# Patient Record
Sex: Female | Born: 1940 | Race: White | Hispanic: No | Smoking: Never smoker
Health system: Southern US, Community
[De-identification: ages and names within clinical notes are randomized; demographics above are authoritative.]

## PROBLEM LIST (undated history)

## (undated) DIAGNOSIS — I2510 Atherosclerotic heart disease of native coronary artery without angina pectoris: Secondary | ICD-10-CM

## (undated) DIAGNOSIS — E7800 Pure hypercholesterolemia, unspecified: Secondary | ICD-10-CM

## (undated) DIAGNOSIS — R519 Headache, unspecified: Secondary | ICD-10-CM

## (undated) DIAGNOSIS — N183 Chronic kidney disease, stage 3 unspecified: Secondary | ICD-10-CM

## (undated) DIAGNOSIS — R51 Headache: Secondary | ICD-10-CM

## (undated) DIAGNOSIS — M8580 Other specified disorders of bone density and structure, unspecified site: Secondary | ICD-10-CM

## (undated) DIAGNOSIS — J90 Pleural effusion, not elsewhere classified: Secondary | ICD-10-CM

## (undated) DIAGNOSIS — Z860100 Personal history of colon polyps, unspecified: Secondary | ICD-10-CM

## (undated) DIAGNOSIS — E785 Hyperlipidemia, unspecified: Secondary | ICD-10-CM

## (undated) DIAGNOSIS — M1990 Unspecified osteoarthritis, unspecified site: Secondary | ICD-10-CM

## (undated) DIAGNOSIS — J45909 Unspecified asthma, uncomplicated: Secondary | ICD-10-CM

## (undated) HISTORY — DX: Personal history of colonic polyps: Z86.010

## (undated) HISTORY — PX: CARDIAC CATHETERIZATION: SHX172

## (undated) HISTORY — DX: Personal history of colon polyps, unspecified: Z86.0100

## (undated) HISTORY — DX: Hyperlipidemia, unspecified: E78.5

## (undated) HISTORY — DX: Other specified disorders of bone density and structure, unspecified site: M85.80

## (undated) HISTORY — DX: Unspecified asthma, uncomplicated: J45.909

## (undated) HISTORY — DX: Pure hypercholesterolemia, unspecified: E78.00

## (undated) HISTORY — DX: Chronic kidney disease, stage 3 unspecified: N18.30

---

## 1998-02-22 ENCOUNTER — Ambulatory Visit (HOSPITAL_COMMUNITY): Admission: RE | Admit: 1998-02-22 | Discharge: 1998-02-22 | Payer: Self-pay | Admitting: Gastroenterology

## 1998-08-02 ENCOUNTER — Other Ambulatory Visit: Admission: RE | Admit: 1998-08-02 | Discharge: 1998-08-02 | Payer: Self-pay | Admitting: *Deleted

## 1999-10-11 ENCOUNTER — Encounter: Admission: RE | Admit: 1999-10-11 | Discharge: 1999-10-11 | Payer: Self-pay | Admitting: *Deleted

## 1999-10-11 ENCOUNTER — Encounter: Payer: Self-pay | Admitting: *Deleted

## 1999-10-30 ENCOUNTER — Other Ambulatory Visit: Admission: RE | Admit: 1999-10-30 | Discharge: 1999-10-30 | Payer: Self-pay | Admitting: *Deleted

## 2000-10-11 ENCOUNTER — Encounter: Admission: RE | Admit: 2000-10-11 | Discharge: 2000-10-11 | Payer: Self-pay | Admitting: *Deleted

## 2000-10-11 ENCOUNTER — Encounter: Payer: Self-pay | Admitting: *Deleted

## 2000-10-29 ENCOUNTER — Other Ambulatory Visit: Admission: RE | Admit: 2000-10-29 | Discharge: 2000-10-29 | Payer: Self-pay | Admitting: *Deleted

## 2002-01-28 ENCOUNTER — Encounter: Payer: Self-pay | Admitting: *Deleted

## 2002-01-28 ENCOUNTER — Encounter: Admission: RE | Admit: 2002-01-28 | Discharge: 2002-01-28 | Payer: Self-pay | Admitting: *Deleted

## 2002-02-04 ENCOUNTER — Other Ambulatory Visit: Admission: RE | Admit: 2002-02-04 | Discharge: 2002-02-04 | Payer: Self-pay | Admitting: *Deleted

## 2003-01-29 ENCOUNTER — Other Ambulatory Visit: Admission: RE | Admit: 2003-01-29 | Discharge: 2003-01-29 | Payer: Self-pay | Admitting: *Deleted

## 2003-02-02 ENCOUNTER — Encounter: Admission: RE | Admit: 2003-02-02 | Discharge: 2003-02-02 | Payer: Self-pay | Admitting: *Deleted

## 2003-02-02 ENCOUNTER — Encounter: Payer: Self-pay | Admitting: *Deleted

## 2003-03-02 ENCOUNTER — Ambulatory Visit (HOSPITAL_COMMUNITY): Admission: RE | Admit: 2003-03-02 | Discharge: 2003-03-02 | Payer: Self-pay | Admitting: Gastroenterology

## 2003-03-02 ENCOUNTER — Encounter (INDEPENDENT_AMBULATORY_CARE_PROVIDER_SITE_OTHER): Payer: Self-pay | Admitting: *Deleted

## 2004-03-03 ENCOUNTER — Encounter: Admission: RE | Admit: 2004-03-03 | Discharge: 2004-03-03 | Payer: Self-pay | Admitting: Internal Medicine

## 2005-05-30 ENCOUNTER — Encounter: Admission: RE | Admit: 2005-05-30 | Discharge: 2005-05-30 | Payer: Self-pay | Admitting: Internal Medicine

## 2006-06-11 ENCOUNTER — Ambulatory Visit (HOSPITAL_COMMUNITY): Admission: RE | Admit: 2006-06-11 | Discharge: 2006-06-11 | Payer: Self-pay | Admitting: Internal Medicine

## 2006-07-29 ENCOUNTER — Other Ambulatory Visit: Admission: RE | Admit: 2006-07-29 | Discharge: 2006-07-29 | Payer: Self-pay | Admitting: Family Medicine

## 2007-07-21 ENCOUNTER — Ambulatory Visit (HOSPITAL_COMMUNITY): Admission: RE | Admit: 2007-07-21 | Discharge: 2007-07-21 | Payer: Self-pay | Admitting: Family Medicine

## 2008-10-05 ENCOUNTER — Ambulatory Visit (HOSPITAL_COMMUNITY): Admission: RE | Admit: 2008-10-05 | Discharge: 2008-10-05 | Payer: Self-pay | Admitting: Family Medicine

## 2009-08-01 ENCOUNTER — Other Ambulatory Visit: Admission: RE | Admit: 2009-08-01 | Discharge: 2009-08-01 | Payer: Self-pay | Admitting: Family Medicine

## 2009-11-02 ENCOUNTER — Ambulatory Visit (HOSPITAL_COMMUNITY): Admission: RE | Admit: 2009-11-02 | Discharge: 2009-11-02 | Payer: Self-pay | Admitting: Family Medicine

## 2010-10-05 ENCOUNTER — Other Ambulatory Visit (HOSPITAL_COMMUNITY): Payer: Self-pay | Admitting: Family Medicine

## 2010-10-05 DIAGNOSIS — Z1231 Encounter for screening mammogram for malignant neoplasm of breast: Secondary | ICD-10-CM

## 2010-10-05 DIAGNOSIS — Z0000 Encounter for general adult medical examination without abnormal findings: Secondary | ICD-10-CM

## 2010-11-09 ENCOUNTER — Ambulatory Visit (HOSPITAL_COMMUNITY)
Admission: RE | Admit: 2010-11-09 | Discharge: 2010-11-09 | Disposition: A | Discharge status: Home / Self Care | Payer: PRIVATE HEALTH INSURANCE | Source: Ambulatory Visit | Attending: Family Medicine | Admitting: Family Medicine

## 2010-11-09 DIAGNOSIS — Z1231 Encounter for screening mammogram for malignant neoplasm of breast: Secondary | ICD-10-CM | POA: Insufficient documentation

## 2011-02-02 NOTE — Op Note (Signed)
NAME:  Kimberly Calhoun, Kimberly Calhoun                          ACCOUNT NO.:  [id number]   MEDICAL RECORD NO.:  [id number]                   PATIENT TYPE:  AMB   LOCATION:  ENDO                                 FACILITY:  [facility]   PHYSICIAN:  Bernette Redbird, M.D.                DATE OF BIRTH:  [birth year]   DATE OF PROCEDURE:  03/03/2003  DATE OF DISCHARGE:                                 OPERATIVE REPORT   PROCEDURE PERFORMED:  Colonoscopy with biopsies.   INDICATIONS FOR PROCEDURE:  A 70 year old female with family history of  colon cancer in her mother, who was diagnosed around age 50.  The patient  had a negative colonoscopy (hyperplastic polyps only) five years ago.   FINDINGS:  Two diminutive polyps removed.   CONSENT:  The nature, purpose and risks of the procedure had been discussed  with the patient previously and she provided written consent.   SEDATION:  Fentanyl 100 mcg and Versed 10 mg IV which is somewhat less than  at the time of her previous procedure.   DESCRIPTION OF PROCEDURE:  The Olympus adjustable tension pediatric video  colonoscope was advanced around the colon to the cecum and pull back was  then performed.   It was rather difficult to negotiate the sigmoid region where there was a  sharp angulation with some degree of fixation but once we were around that  kink in the colon, we were able to advance quite easily.   The quality of the prep was very good except for some adherent stool in the  cecum and proximal ascending colon which could not be irrigated off.  This  was just a film and it is not felt that it would have obscured any  significant lesions.   There were two tiny hyperplastic appearing sessile polyps removed during  this exam, one in the cecum and the other at 34 cm.  Each was about 2 mm  across, hyperplastic in appearance and removed by a single cold biopsy.  No  large polyps, cancer, colitis, vascular malformations or diverticulosis were  noted.   Retroflexion of the rectum and reinspection of the rectum was unremarkable.   The patient tolerated the procedure well and there were no apparent  complications.   IMPRESSION:  Two diminutive polyps, otherwise unremarkable exam.    PLAN:  Follow up colonoscopy in five years regardless of the polyp  histology, in view of the family history of colon cancer.                                               Bernette Redbird, M.D.    RB/MEDQ  D:  03/02/2003  T:  03/02/2003  Job:  [id number]   cc:   Sharlet Salina, M.D.  [address]  [place]  [place] [address]  Fax: [phone]

## 2011-11-20 ENCOUNTER — Other Ambulatory Visit (HOSPITAL_COMMUNITY): Payer: Self-pay | Admitting: Family Medicine

## 2011-11-20 DIAGNOSIS — Z1231 Encounter for screening mammogram for malignant neoplasm of breast: Secondary | ICD-10-CM

## 2011-12-19 ENCOUNTER — Ambulatory Visit (HOSPITAL_COMMUNITY)
Admission: RE | Admit: 2011-12-19 | Discharge: 2011-12-19 | Disposition: A | Discharge status: Home / Self Care | Payer: Commercial Managed Care - PPO | Source: Ambulatory Visit | Attending: Family Medicine | Admitting: Family Medicine

## 2011-12-19 DIAGNOSIS — Z1231 Encounter for screening mammogram for malignant neoplasm of breast: Secondary | ICD-10-CM | POA: Insufficient documentation

## 2012-11-19 ENCOUNTER — Other Ambulatory Visit (HOSPITAL_COMMUNITY): Payer: Self-pay | Admitting: Family Medicine

## 2012-11-19 DIAGNOSIS — Z1231 Encounter for screening mammogram for malignant neoplasm of breast: Secondary | ICD-10-CM

## 2012-12-19 ENCOUNTER — Ambulatory Visit (HOSPITAL_COMMUNITY)
Admission: RE | Admit: 2012-12-19 | Discharge: 2012-12-19 | Disposition: A | Discharge status: Home / Self Care | Payer: Commercial Managed Care - PPO | Source: Ambulatory Visit | Attending: Family Medicine | Admitting: Family Medicine

## 2012-12-19 DIAGNOSIS — Z1231 Encounter for screening mammogram for malignant neoplasm of breast: Secondary | ICD-10-CM | POA: Insufficient documentation

## 2013-02-15 HISTORY — PX: ABDOMINAL HYSTERECTOMY: SHX81

## 2014-02-16 ENCOUNTER — Other Ambulatory Visit (HOSPITAL_COMMUNITY): Payer: Self-pay | Admitting: Family Medicine

## 2014-02-16 DIAGNOSIS — Z1231 Encounter for screening mammogram for malignant neoplasm of breast: Secondary | ICD-10-CM

## 2014-02-25 ENCOUNTER — Ambulatory Visit (HOSPITAL_COMMUNITY)
Admission: RE | Admit: 2014-02-25 | Discharge: 2014-02-25 | Disposition: A | Discharge status: Home / Self Care | Payer: 59 | Source: Ambulatory Visit | Attending: Family Medicine | Admitting: Family Medicine

## 2014-02-25 DIAGNOSIS — Z1231 Encounter for screening mammogram for malignant neoplasm of breast: Secondary | ICD-10-CM | POA: Insufficient documentation

## 2014-11-29 ENCOUNTER — Encounter (HOSPITAL_COMMUNITY): Payer: Self-pay | Admitting: *Deleted

## 2014-12-06 ENCOUNTER — Other Ambulatory Visit: Payer: Self-pay | Admitting: Gastroenterology

## 2014-12-06 NOTE — Anesthesia Preprocedure Evaluation (Signed)
Anesthesia Evaluation  Patient identified by MRN, date of birth, ID band Patient awake    Reviewed: Allergy & Precautions, NPO status , Patient's Chart, lab work & pertinent test results  History of Anesthesia Complications Negative for: history of anesthetic complications  Airway Mallampati: II  TM Distance: >3 FB Neck ROM: Full    Dental no notable dental hx. (+) Dental Advisory Given   Pulmonary neg pulmonary ROS,  breath sounds clear to auscultation  Pulmonary exam normal       Cardiovascular negative cardio ROS  Rhythm:Regular Rate:Normal     Neuro/Psych  Headaches, negative psych ROS   GI/Hepatic negative GI ROS, Neg liver ROS,   Endo/Other  negative endocrine ROS  Renal/GU negative Renal ROS  negative genitourinary   Musculoskeletal  (+) Arthritis -, Osteoarthritis,    Abdominal   Peds negative pediatric ROS (+)  Hematology negative hematology ROS (+)   Anesthesia Other Findings   Reproductive/Obstetrics negative OB ROS                             Anesthesia Physical Anesthesia Plan  ASA: II  Anesthesia Plan: MAC   Post-op Pain Management:    Induction: Intravenous  Airway Management Planned: Nasal Cannula  Additional Equipment:   Intra-op Plan:   Post-operative Plan:   Informed Consent: I have reviewed the patients History and Physical, chart, labs and discussed the procedure including the risks, benefits and alternatives for the proposed anesthesia with the patient or authorized representative who has indicated his/her understanding and acceptance.   Dental advisory given  Plan Discussed with: CRNA  Anesthesia Plan Comments:         Anesthesia Quick Evaluation

## 2014-12-07 ENCOUNTER — Encounter (HOSPITAL_COMMUNITY)
Admission: RE | Disposition: A | Discharge status: Home / Self Care | Payer: Self-pay | Source: Ambulatory Visit | Attending: Gastroenterology

## 2014-12-07 ENCOUNTER — Ambulatory Visit (HOSPITAL_COMMUNITY)
Admission: RE | Admit: 2014-12-07 | Discharge: 2014-12-07 | Disposition: A | Discharge status: Home / Self Care | Payer: Commercial Managed Care - PPO | Source: Ambulatory Visit | Attending: Gastroenterology | Admitting: Gastroenterology

## 2014-12-07 ENCOUNTER — Ambulatory Visit (HOSPITAL_COMMUNITY): Payer: Commercial Managed Care - PPO | Admitting: Anesthesiology

## 2014-12-07 ENCOUNTER — Encounter (HOSPITAL_COMMUNITY): Payer: Self-pay | Admitting: Gastroenterology

## 2014-12-07 DIAGNOSIS — Z800 Family history of malignant neoplasm of digestive organs: Secondary | ICD-10-CM | POA: Insufficient documentation

## 2014-12-07 DIAGNOSIS — Z86010 Personal history of colonic polyps: Secondary | ICD-10-CM | POA: Diagnosis not present

## 2014-12-07 DIAGNOSIS — Z888 Allergy status to other drugs, medicaments and biological substances status: Secondary | ICD-10-CM | POA: Diagnosis not present

## 2014-12-07 DIAGNOSIS — K5730 Diverticulosis of large intestine without perforation or abscess without bleeding: Secondary | ICD-10-CM | POA: Diagnosis not present

## 2014-12-07 DIAGNOSIS — Z90710 Acquired absence of both cervix and uterus: Secondary | ICD-10-CM | POA: Insufficient documentation

## 2014-12-07 DIAGNOSIS — Z09 Encounter for follow-up examination after completed treatment for conditions other than malignant neoplasm: Secondary | ICD-10-CM | POA: Insufficient documentation

## 2014-12-07 HISTORY — DX: Unspecified osteoarthritis, unspecified site: M19.90

## 2014-12-07 HISTORY — PX: COLONOSCOPY WITH PROPOFOL: SHX5780

## 2014-12-07 HISTORY — DX: Headache, unspecified: R51.9

## 2014-12-07 HISTORY — DX: Headache: R51

## 2014-12-07 SURGERY — COLONOSCOPY WITH PROPOFOL
Anesthesia: Monitor Anesthesia Care

## 2014-12-07 MED ORDER — PROPOFOL 10 MG/ML IV BOLUS
INTRAVENOUS | Status: AC
  Filled 2014-12-07: qty 20

## 2014-12-07 MED ORDER — PROPOFOL INFUSION 10 MG/ML OPTIME
INTRAVENOUS | Status: DC | PRN
  Administered 2014-12-07: 140 ug/kg/min via INTRAVENOUS

## 2014-12-07 MED ORDER — LACTATED RINGERS IV SOLN
INTRAVENOUS | Status: DC
  Administered 2014-12-07: 100 mL/h via INTRAVENOUS

## 2014-12-07 MED ORDER — LIDOCAINE HCL (CARDIAC) 20 MG/ML IV SOLN
INTRAVENOUS | Status: AC
  Filled 2014-12-07: qty 5

## 2014-12-07 MED ORDER — LIDOCAINE HCL (CARDIAC) 20 MG/ML IV SOLN
INTRAVENOUS | Status: DC | PRN
  Administered 2014-12-07: 100 mg via INTRAVENOUS

## 2014-12-07 MED ORDER — SODIUM CHLORIDE 0.9 % IV SOLN: INTRAVENOUS | Status: DC

## 2014-12-07 SURGICAL SUPPLY — 22 items

## 2014-12-07 NOTE — Transfer of Care (Signed)
Immediate Anesthesia Transfer of Care Note  Patient: Kimberly Calhoun  Procedure(s) Performed: Procedure(s) with comments: COLONOSCOPY WITH PROPOFOL (N/A) - ultraslim  Patient Location: PACU and Endoscopy Unit  Anesthesia Type:MAC  Level of Consciousness: awake, alert , oriented and patient cooperative  Airway & Oxygen Therapy: Patient Spontanous Breathing and Patient connected to face mask oxygen  Post-op Assessment: Report given to RN, Post -op Vital signs reviewed and stable and Patient moving all extremities  Post vital signs: Reviewed and stable  Last Vitals:  Filed Vitals:   12/07/14 1047  BP: 179/65  Pulse: 80  Temp: 36.7 C  Resp: 12    Complications: No apparent anesthesia complications

## 2014-12-07 NOTE — Anesthesia Procedure Notes (Signed)
Procedure Name: MAC Date/Time: 12/07/2014 11:34 AM Performed by: Jarvis NewcomerARMISTEAD, Brynnley Dayrit A Pre-anesthesia Checklist: Patient identified, Emergency Drugs available, Timeout performed, Suction available and Patient being monitored Patient Re-evaluated:Patient Re-evaluated prior to inductionOxygen Delivery Method: Simple face mask Dental Injury: Teeth and Oropharynx as per pre-operative assessment

## 2014-12-07 NOTE — H&P (Signed)
Kimberly Calhoun is an 74 y.o. female.  Chief Complaint: History of colon polyps HPI: 74 year old patient returns approximately 7 years from the time of her most recent colonoscopy for surveillance. She had a solitary diminutive adenoma in 2004 with a negative colonoscopy in 2009, made more difficult by severe sigmoid fixation although since then, the patient has had surgery which included a vaginal hysterectomy and appendix of her bladder and apparently also her sigmoid colon. Note that there is a family history of colon cancer in her mother at age 76 with the patient herself is free of any lower GI symptoms at this time  Past Medical History  Diagnosis Date  . Headache   . Arthritis     Past Surgical History  Procedure Laterality Date  . Abdominal hysterectomy  02/2013    History reviewed. No pertinent family history. Social History:  reports that she has never smoked. She does not have any smokeless tobacco history on file. She reports that she does not drink alcohol or use illicit drugs.  Allergies:  Allergies  Allergen Reactions  . Macrobid [Nitrofurantoin] Other (See Comments)    Cold chills, muscle weakness.  . Mold Extract [Trichophyton] Other (See Comments)    Allergies.     Medications Prior to Admission  Medication Sig Dispense Refill  . aspirin EC 81 MG tablet Take 81 mg by mouth every morning.    Marland Kitchen. CALCIUM PO Take 1 tablet by mouth at bedtime as needed.    . Cholecalciferol (VITAMIN D PO) Take 1 tablet by mouth every morning.    . loratadine-pseudoephedrine (CLARITIN-D 24-HOUR) 10-240 MG per 24 hr tablet Take 1 tablet by mouth daily as needed for allergies.    . Multiple Vitamin (MULTIVITAMIN WITH MINERALS) TABS tablet Take 1 tablet by mouth at bedtime. Centrum silver.    Marland Kitchen. Propylene Glycol (SYSTANE BALANCE OP) Apply 1 drop to eye daily as needed (dry eyes.).    Marland Kitchen. vitamin C (ASCORBIC ACID) 500 MG tablet Take 500 mg by mouth every morning.    . vitamin E 400 UNIT capsule  Take 400 Units by mouth every morning.    Marland Kitchen. acetaminophen (TYLENOL) 500 MG tablet Take 500 mg by mouth every 6 (six) hours as needed for moderate pain or headache.    . ibuprofen (ADVIL,MOTRIN) 200 MG tablet Take 200 mg by mouth every 6 (six) hours as needed for headache or moderate pain.      No results found for this or any previous visit (from the past 48 hour(s)). No results found.  ROS see history of present illness  Blood pressure 179/65, pulse 80, temperature 98 F (36.7 C), temperature source Oral, resp. rate 12, height 5\' 3"  (1.6 m), weight 68.04 kg (150 lb), SpO2 98 %. Physical Exam pleasant, healthy appearing female in no distress. Does not appear anxious or depressed. No overt pallor or icterus. Chest clear. Heart without murmur or arrhythmia. Abdomen without guarding, mass effect, or tenderness  Assessment/Plan 1. Prior history of solitary diminutive adenoma 2. Family history of colon cancer at advanced age  Despite the patient's age, I feel she is medically appropriate for surveillance colonoscopy. We will plan to use the ultraslim colonoscope and propofol sedation to improve the probability of a successful exam. It is also hope that her surgery will have made her anatomy more favorable for colonoscopic evaluation.  Boone Gear V 12/07/2014, 11:15 AM

## 2014-12-07 NOTE — Anesthesia Postprocedure Evaluation (Signed)
  Anesthesia Post-op Note  Patient: Kimberly Calhoun  Procedure(s) Performed: Procedure(s) (LRB): COLONOSCOPY WITH PROPOFOL (N/A)  Patient Location: PACU  Anesthesia Type: MAC  Level of Consciousness: awake and alert   Airway and Oxygen Therapy: Patient Spontanous Breathing  Post-op Pain: mild  Post-op Assessment: Post-op Vital signs reviewed, Patient's Cardiovascular Status Stable, Respiratory Function Stable, Patent Airway and No signs of Nausea or vomiting  Last Vitals:  Filed Vitals:   12/07/14 1245  BP:   Pulse: 75  Temp:   Resp: 14    Post-op Vital Signs: stable   Complications: No apparent anesthesia complications

## 2014-12-07 NOTE — Op Note (Signed)
[facility] [address] [place], [id number]   COLONOSCOPY PROCEDURE REPORT  PATIENT: Kimberly Calhoun, [name]  MR#: [id number] BIRTHDATE: March 17, 1941 , 73  yrs. old GENDER: female ENDOSCOPIST: Bernette Redbirdobert Reilyn Nelson, MD REFERRED BY:  Catha GosselinKevin Little, MD PROCEDURE DATE:  12/07/2014 PROCEDURE:   Colonoscopy ASA CLASS:   per anesthesia INDICATIONS:past history of solitary diminutive adenomatous polyp MEDICATIONS: Mac  DESCRIPTION OF PROCEDURE:   After the risks and benefits and of the procedure were explained, informed consent was obtained.        The EC-[id number] ([id number]  ultraslim colonoscope was introduced through the anus and advanced to the  cecum, as identified by visualization of the ileocecal valve and appendiceal orifice.   .  The quality of the prep was good      .  The instrument was then slowly withdrawn as the colon was fully examined. Estimated blood loss is zero unless otherwise noted in this procedure report.   there was mild sigmoid diverticulosis.        this was a difficult exam, even using the ultraslim colonoscope. There was simply a lot of colonic fixation in the sigmoid region. Ultimately, with help of some external abdominal compression and positioning the patient in the supine and right lateral decubitus positions, I reached the cecum and pullback was then performed. There was a film of stool coating the ascending colon, which might have obscured flat lesions but apart from that the prep was very good and it's felt that all areas were adequately seen.  No polyps, cancer, colitis, or vascular ectasia were noted. There was mild sigmoid diverticulosis with significant sigmoid fixation. retroflexion the rectum was normal.          The scope was then withdrawn from the patient and the procedure completed.the patient tolerated the procedure quite well. No biopsies were obtained.  WITHDRAWAL TIME: 10 minutes  COMPLICATIONS: There were no immediate  complications.  ENDOSCOPIC IMPRESSION: mild sigmoid diverticulosis. No evidence of recurrent colon polyps.  RECOMMENDATIONS: When necessary follow-up. By current guidelines, this patient who has had a remote history of a solitary diminutive adenoma, with a negative subsequent surveillance exam, can be followed at 10 year intervals but by then she would be age 74, so in view of the favorable findings on the current exam, and taking into consideration the patient's age, no further routine surveillance colonoscopies are anticipated for this patient.  REPEAT EXAM: not necessary  cc:  _______________________________ eSignedBernette Redbird:  Pranit Owensby, MD 12/07/2014 12:24 PM   CPT CODES: ICD CODES:  The ICD and CPT codes recommended by this software are interpretations from the data that the clinical staff has captured with the software.  The verification of the translation of this report to the ICD and CPT codes and modifiers is the sole responsibility of the health care institution and practicing physician where this report was generated.  [employer] will not be held responsible for the validity of the ICD and CPT codes included on this report.  AMA assumes no liability for data contained or not contained herein. CPT is a Publishing rights managerregistered trademark of the [employer].   PATIENT NAME:  Kimberly Calhoun, Rosaly B MR#: [id number]

## 2014-12-07 NOTE — Discharge Instructions (Signed)

## 2014-12-08 ENCOUNTER — Encounter (HOSPITAL_COMMUNITY): Payer: Self-pay | Admitting: Gastroenterology

## 2015-02-01 ENCOUNTER — Other Ambulatory Visit (HOSPITAL_COMMUNITY): Payer: Self-pay | Admitting: Family Medicine

## 2015-02-01 DIAGNOSIS — Z1231 Encounter for screening mammogram for malignant neoplasm of breast: Secondary | ICD-10-CM

## 2015-02-28 ENCOUNTER — Ambulatory Visit (HOSPITAL_COMMUNITY)
Admission: RE | Admit: 2015-02-28 | Discharge: 2015-02-28 | Disposition: A | Discharge status: Home / Self Care | Payer: Commercial Managed Care - PPO | Source: Ambulatory Visit | Attending: Family Medicine | Admitting: Family Medicine

## 2015-02-28 DIAGNOSIS — Z1231 Encounter for screening mammogram for malignant neoplasm of breast: Secondary | ICD-10-CM | POA: Diagnosis present

## 2016-02-09 ENCOUNTER — Other Ambulatory Visit: Payer: Self-pay

## 2016-02-09 DIAGNOSIS — Z1231 Encounter for screening mammogram for malignant neoplasm of breast: Secondary | ICD-10-CM

## 2016-03-01 ENCOUNTER — Ambulatory Visit
Admission: RE | Admit: 2016-03-01 | Discharge: 2016-03-01 | Disposition: A | Discharge status: Home / Self Care | Payer: Commercial Managed Care - PPO | Source: Ambulatory Visit

## 2016-03-01 ENCOUNTER — Other Ambulatory Visit: Payer: Self-pay | Admitting: Family Medicine

## 2016-03-01 DIAGNOSIS — Z1231 Encounter for screening mammogram for malignant neoplasm of breast: Secondary | ICD-10-CM

## 2017-03-12 ENCOUNTER — Other Ambulatory Visit: Payer: Self-pay | Admitting: Family Medicine

## 2017-03-12 DIAGNOSIS — Z1231 Encounter for screening mammogram for malignant neoplasm of breast: Secondary | ICD-10-CM

## 2017-03-19 ENCOUNTER — Ambulatory Visit: Payer: Commercial Managed Care - PPO

## 2017-03-26 ENCOUNTER — Ambulatory Visit: Payer: Commercial Managed Care - PPO

## 2017-04-01 ENCOUNTER — Ambulatory Visit
Admission: RE | Admit: 2017-04-01 | Discharge: 2017-04-01 | Disposition: A | Discharge status: Home / Self Care | Payer: Commercial Managed Care - PPO | Source: Ambulatory Visit | Attending: Family Medicine | Admitting: Family Medicine

## 2017-04-01 DIAGNOSIS — Z1231 Encounter for screening mammogram for malignant neoplasm of breast: Secondary | ICD-10-CM

## 2018-04-11 ENCOUNTER — Other Ambulatory Visit: Payer: Self-pay | Admitting: Family Medicine

## 2018-04-11 DIAGNOSIS — Z1231 Encounter for screening mammogram for malignant neoplasm of breast: Secondary | ICD-10-CM

## 2018-05-05 ENCOUNTER — Ambulatory Visit
Admission: RE | Admit: 2018-05-05 | Discharge: 2018-05-05 | Disposition: A | Discharge status: Home / Self Care | Payer: Commercial Managed Care - PPO | Source: Ambulatory Visit | Attending: Family Medicine | Admitting: Family Medicine

## 2018-05-05 DIAGNOSIS — Z1231 Encounter for screening mammogram for malignant neoplasm of breast: Secondary | ICD-10-CM

## 2019-06-25 ENCOUNTER — Other Ambulatory Visit: Payer: Self-pay | Admitting: Family Medicine

## 2019-06-25 DIAGNOSIS — Z1231 Encounter for screening mammogram for malignant neoplasm of breast: Secondary | ICD-10-CM

## 2019-08-11 ENCOUNTER — Ambulatory Visit
Admission: RE | Admit: 2019-08-11 | Discharge: 2019-08-11 | Disposition: A | Discharge status: Home / Self Care | Payer: Commercial Managed Care - PPO | Source: Ambulatory Visit | Attending: Family Medicine | Admitting: Family Medicine

## 2019-08-11 ENCOUNTER — Other Ambulatory Visit: Payer: Self-pay | Admitting: Family Medicine

## 2019-08-11 ENCOUNTER — Other Ambulatory Visit: Payer: Self-pay

## 2019-08-11 DIAGNOSIS — Z1231 Encounter for screening mammogram for malignant neoplasm of breast: Secondary | ICD-10-CM

## 2020-07-21 ENCOUNTER — Other Ambulatory Visit: Payer: Self-pay | Admitting: Family Medicine

## 2020-07-21 DIAGNOSIS — Z1231 Encounter for screening mammogram for malignant neoplasm of breast: Secondary | ICD-10-CM

## 2020-08-30 ENCOUNTER — Other Ambulatory Visit: Payer: Self-pay

## 2020-08-30 ENCOUNTER — Ambulatory Visit
Admission: RE | Admit: 2020-08-30 | Discharge: 2020-08-30 | Disposition: A | Discharge status: Home / Self Care | Payer: Medicare Other | Source: Ambulatory Visit | Attending: Family Medicine | Admitting: Family Medicine

## 2020-08-30 DIAGNOSIS — Z1231 Encounter for screening mammogram for malignant neoplasm of breast: Secondary | ICD-10-CM

## 2021-01-17 ENCOUNTER — Other Ambulatory Visit: Payer: Self-pay

## 2021-01-17 ENCOUNTER — Ambulatory Visit: Payer: Medicare Other | Admitting: Interventional Cardiology

## 2021-01-17 ENCOUNTER — Encounter: Payer: Self-pay | Admitting: Interventional Cardiology

## 2021-01-17 VITALS — BP 140/82 | HR 90 | Ht 63.0 in | Wt 140.8 lb

## 2021-01-17 DIAGNOSIS — R0600 Dyspnea, unspecified: Secondary | ICD-10-CM

## 2021-01-17 DIAGNOSIS — R072 Precordial pain: Secondary | ICD-10-CM

## 2021-01-17 DIAGNOSIS — R0609 Other forms of dyspnea: Secondary | ICD-10-CM

## 2021-01-17 NOTE — Patient Instructions (Signed)
Medication Instructions:  Your physician recommends that you continue on your current medications as directed. Please refer to the Current Medication list given to you today.  *If you need a refill on your cardiac medications before your next appointment, please call your pharmacy*   Lab Work: none If you have labs (blood work) drawn today and your tests are completely normal, you will receive your results only by: Marland Kitchen MyChart Message (if you have MyChart) OR . A paper copy in the mail If you have any lab test that is abnormal or we need to change your treatment, we will call you to review the results.   Testing/Procedures: none   Follow-Up: At [facility], you and your health needs are our priority.  As part of our continuing mission to provide you with exceptional heart care, we have created designated Provider Care Teams.  These Care Teams include your primary Cardiologist (physician) and Advanced Practice Providers (APPs -  Physician Assistants and Nurse Practitioners) who all work together to provide you with the care you need, when you need it.  We recommend signing up for the patient portal called "MyChart".  Sign up information is provided on this After Visit Summary.  MyChart is used to connect with patients for Virtual Visits (Telemedicine).  Patients are able to view lab/test results, encounter notes, upcoming appointments, etc.  Non-urgent messages can be sent to your provider as well.   To learn more about what you can do with MyChart, go to [email or web address].    Your next appointment:   6 month(s)  The format for your next appointment:   In Person  Provider:   You may see Lance Muss, MD or one of the following Advanced Practice Providers on your designated Care Team:    Ronie Spies, PA-C  Jacolyn Reedy, PA-C    Other Instructions Call our office if your symptoms return

## 2021-01-17 NOTE — Progress Notes (Signed)
Cardiology Office Note   Date:  01/17/2021   ID:  Kimberly Calhoun, DOB 1941-05-29, MRN [id number]  PCP:  Ileana Ladd, MD    No chief complaint on file.  Chest pain  Wt Readings from Last 3 Encounters:  01/17/21 140 lb 12.8 oz (63.9 kg)  12/07/14 150 lb (68 kg)       History of Present Illness: Kimberly Calhoun is a 80 y.o. female who is being seen today for the evaluation of chest pain/elevated heart rate at the request of Kimberly Floro, MD.   Her husband was my patient, Kimberly Calhoun, and he passed away in Sep 29, 2019.   In April 12, 2022she was having severe allergies.  She felt some chest wall muscle spasms which improved with treatment from a chiropractor.    She also had some DOE.  Worse when she had a new cat.  better since she got rid of the cat.  She thinks it was allergy related.    She works in her yard a lot and walks up steps multiple times a day.  She is involved in other activities.  Sx have significantly improved over the past few weeks.    Denies : Dizziness. Leg edema. Nitroglycerin use. Orthopnea. Palpitations. Paroxysmal nocturnal dyspnea. Syncope.   Stress test as done many years ago.  No cardiac testing recently.    Past Medical History:  Diagnosis Date  . Arthritis   . Chronic kidney disease, stage 3 (HCC)   . Headache   . Hypercholesteremia   . Hyperlipidemia   . Osteopenia   . Personal history of colonic polyps     Past Surgical History:  Procedure Laterality Date  . ABDOMINAL HYSTERECTOMY  02/2013  . COLONOSCOPY WITH PROPOFOL N/A 12/07/2014   Procedure: COLONOSCOPY WITH PROPOFOL;  Surgeon: Bernette Redbird, MD;  Location: [facility] ENDOSCOPY;  Service: Endoscopy;  Laterality: N/A;  ultraslim     Current Outpatient Medications  Medication Sig Dispense Refill  . acetaminophen (TYLENOL) 500 MG tablet Take 500 mg by mouth every 6 (six) hours as needed for moderate pain or headache.    . albuterol (VENTOLIN HFA) 108 (90 Base) MCG/ACT inhaler 1-2  puffs as needed    . albuterol (VENTOLIN HFA) 108 (90 Base) MCG/ACT inhaler SMARTSIG:1-2 Puff(s) By Mouth Every 4 Hours PRN    . aspirin EC 81 MG tablet Take 81 mg by mouth every morning.    Marland Kitchen CALCIUM PO Take 1 tablet by mouth at bedtime as needed.    . Cholecalciferol (VITAMIN D PO) Take 1 tablet by mouth every morning.    . Cyanocobalamin 5000 MCG SUBL Place under the tongue.    Marland Kitchen ibuprofen (ADVIL,MOTRIN) 200 MG tablet Take 200 mg by mouth every 6 (six) hours as needed for headache or moderate pain.    Marland Kitchen loratadine-pseudoephedrine (CLARITIN-D 24-HOUR) 10-240 MG per 24 hr tablet Take 1 tablet by mouth daily as needed for allergies.    . Multiple Vitamin (MULTIVITAMIN WITH MINERALS) TABS tablet Take 1 tablet by mouth at bedtime. Centrum silver.    Marland Kitchen Propylene Glycol (SYSTANE BALANCE OP) Apply 1 drop to eye daily as needed (dry eyes.).    Marland Kitchen vitamin C (ASCORBIC ACID) 500 MG tablet Take 500 mg by mouth every morning.    . vitamin E 400 UNIT capsule Take 400 Units by mouth every morning.     No current facility-administered medications for this visit.    Allergies:   Macrobid [nitrofurantoin], Mold extract [  trichophyton], Statins, and Other    Social History:  The patient  reports that she has never smoked. She has never used smokeless tobacco. She reports that she does not drink alcohol and does not use drugs.   Family History:  The patient's family history includes Diabetes in her brother; Heart disease in her father.    ROS:  Please see the history of present illness.   Otherwise, review of systems are positive for improving shortness of breath.   All other systems are reviewed and negative.    PHYSICAL EXAM: VS:  BP 140/82   Pulse 90   Ht 5\' 3"  (1.6 m)   Wt 140 lb 12.8 oz (63.9 kg)   SpO2 98%   BMI 24.94 kg/m  , BMI Body mass index is 24.94 kg/m. GEN: Well nourished, well developed, in no acute distress  HEENT: normal  Neck: no JVD, carotid bruits, or masses Cardiac: RRR; no  murmurs, rubs, or gallops,no edema  Respiratory:  clear to auscultation bilaterally, normal work of breathing GI: soft, nontender, nondistended, + BS MS: no deformity or atrophy  Skin: warm and dry, no rash Neuro:  Strength and sensation are intact Psych: euthymic mood, full affect   EKG:   The ekg ordered today demonstrates NSR, no ST changes   Recent Labs: No results found for requested labs within last 8760 hours.   Lipid Panel No results found for: CHOL, TRIG, HDL, CHOLHDL, VLDL, LDLCALC, LDLDIRECT   Other studies Reviewed: Additional studies/ records that were reviewed today with results demonstrating: lipids not available.   ASSESSMENT AND PLAN:  1. Chest pain: Improved with treatment from chiropractor. She felt this was related to chest wall muscle spasm.  No problems with exertion recently.  2. DOE: improved as allergies have improved.  3. Not many risk factors for CAD except for age.  4. We discussed stress testing and CTA coronaries.  Since her sx have improved, she would like to hold off.  I think this is reasonable.  If she has more sx, she will let know and we would plan for CTA coronaries.    Current medicines are reviewed at length with the patient today.  The patient concerns regarding her medicines were addressed.  The following changes have been made:  No change  Labs/ tests ordered today include:  No orders of the defined types were placed in this encounter.   Recommend 150 minutes/week of aerobic exercise Low fat, low carb, high fiber diet recommended  Disposition:   FU in 6 months   Signed, [place], MD  01/17/2021 11:38 AM    [facility] [address] [place], [place], [place]  [place] Phone: [phone]; Fax: [phone]

## 2021-03-21 ENCOUNTER — Emergency Department (HOSPITAL_BASED_OUTPATIENT_CLINIC_OR_DEPARTMENT_OTHER): Payer: Medicare Other | Admitting: Radiology

## 2021-03-21 ENCOUNTER — Inpatient Hospital Stay (HOSPITAL_BASED_OUTPATIENT_CLINIC_OR_DEPARTMENT_OTHER)
Admission: EM | Admit: 2021-03-21 | Discharge: 2021-03-30 | DRG: 234 | Disposition: A | Discharge status: Home / Self Care | Payer: Medicare Other | Attending: Cardiothoracic Surgery | Admitting: Cardiothoracic Surgery

## 2021-03-21 ENCOUNTER — Encounter (HOSPITAL_BASED_OUTPATIENT_CLINIC_OR_DEPARTMENT_OTHER): Payer: Self-pay | Admitting: Emergency Medicine

## 2021-03-21 ENCOUNTER — Other Ambulatory Visit: Payer: Self-pay

## 2021-03-21 DIAGNOSIS — Z7982 Long term (current) use of aspirin: Secondary | ICD-10-CM

## 2021-03-21 DIAGNOSIS — Z888 Allergy status to other drugs, medicaments and biological substances status: Secondary | ICD-10-CM | POA: Diagnosis not present

## 2021-03-21 DIAGNOSIS — I421 Obstructive hypertrophic cardiomyopathy: Secondary | ICD-10-CM | POA: Diagnosis present

## 2021-03-21 DIAGNOSIS — I083 Combined rheumatic disorders of mitral, aortic and tricuspid valves: Secondary | ICD-10-CM | POA: Diagnosis not present

## 2021-03-21 DIAGNOSIS — M1990 Unspecified osteoarthritis, unspecified site: Secondary | ICD-10-CM | POA: Diagnosis not present

## 2021-03-21 DIAGNOSIS — I214 Non-ST elevation (NSTEMI) myocardial infarction: Secondary | ICD-10-CM | POA: Diagnosis not present

## 2021-03-21 DIAGNOSIS — N1831 Chronic kidney disease, stage 3a: Secondary | ICD-10-CM | POA: Diagnosis present

## 2021-03-21 DIAGNOSIS — Z79899 Other long term (current) drug therapy: Secondary | ICD-10-CM | POA: Diagnosis not present

## 2021-03-21 DIAGNOSIS — D72829 Elevated white blood cell count, unspecified: Secondary | ICD-10-CM | POA: Diagnosis present

## 2021-03-21 DIAGNOSIS — J9811 Atelectasis: Secondary | ICD-10-CM

## 2021-03-21 DIAGNOSIS — Z833 Family history of diabetes mellitus: Secondary | ICD-10-CM | POA: Diagnosis not present

## 2021-03-21 DIAGNOSIS — Z8249 Family history of ischemic heart disease and other diseases of the circulatory system: Secondary | ICD-10-CM

## 2021-03-21 DIAGNOSIS — I10 Essential (primary) hypertension: Secondary | ICD-10-CM

## 2021-03-21 DIAGNOSIS — M8580 Other specified disorders of bone density and structure, unspecified site: Secondary | ICD-10-CM | POA: Diagnosis not present

## 2021-03-21 DIAGNOSIS — I129 Hypertensive chronic kidney disease with stage 1 through stage 4 chronic kidney disease, or unspecified chronic kidney disease: Secondary | ICD-10-CM | POA: Diagnosis not present

## 2021-03-21 DIAGNOSIS — I700 Atherosclerosis of aorta: Secondary | ICD-10-CM | POA: Diagnosis present

## 2021-03-21 DIAGNOSIS — R079 Chest pain, unspecified: Secondary | ICD-10-CM | POA: Diagnosis present

## 2021-03-21 DIAGNOSIS — I480 Paroxysmal atrial fibrillation: Secondary | ICD-10-CM | POA: Diagnosis not present

## 2021-03-21 DIAGNOSIS — J939 Pneumothorax, unspecified: Secondary | ICD-10-CM

## 2021-03-21 DIAGNOSIS — T380X5A Adverse effect of glucocorticoids and synthetic analogues, initial encounter: Secondary | ICD-10-CM | POA: Diagnosis not present

## 2021-03-21 DIAGNOSIS — E8770 Fluid overload, unspecified: Secondary | ICD-10-CM | POA: Diagnosis not present

## 2021-03-21 DIAGNOSIS — E7800 Pure hypercholesterolemia, unspecified: Secondary | ICD-10-CM | POA: Diagnosis not present

## 2021-03-21 DIAGNOSIS — Z20822 Contact with and (suspected) exposure to covid-19: Secondary | ICD-10-CM | POA: Diagnosis present

## 2021-03-21 DIAGNOSIS — I2510 Atherosclerotic heart disease of native coronary artery without angina pectoris: Secondary | ICD-10-CM | POA: Diagnosis present

## 2021-03-21 DIAGNOSIS — Z881 Allergy status to other antibiotic agents status: Secondary | ICD-10-CM | POA: Diagnosis not present

## 2021-03-21 DIAGNOSIS — D62 Acute posthemorrhagic anemia: Secondary | ICD-10-CM | POA: Diagnosis not present

## 2021-03-21 DIAGNOSIS — E871 Hypo-osmolality and hyponatremia: Secondary | ICD-10-CM | POA: Diagnosis not present

## 2021-03-21 DIAGNOSIS — Z951 Presence of aortocoronary bypass graft: Secondary | ICD-10-CM

## 2021-03-21 DIAGNOSIS — I25110 Atherosclerotic heart disease of native coronary artery with unstable angina pectoris: Secondary | ICD-10-CM | POA: Diagnosis not present

## 2021-03-21 LAB — BASIC METABOLIC PANEL
Anion gap: 10 (ref 5–15)
BUN: 21 mg/dL (ref 8–23)
CO2: 26 mmol/L (ref 22–32)
Calcium: 9.7 mg/dL (ref 8.9–10.3)
Chloride: 102 mmol/L (ref 98–111)
Creatinine, Ser: 0.93 mg/dL (ref 0.44–1.00)
GFR, Estimated: >60 mL/min (ref >60)
Glucose, Bld: 95 mg/dL (ref 70–99)
Potassium: 3.8 mmol/L (ref 3.5–5.1)
Sodium: 138 mmol/L (ref 135–145)

## 2021-03-21 LAB — BRAIN NATRIURETIC PEPTIDE: B Natriuretic Peptide: 252.7 pg/mL — ABNORMAL HIGH (ref 0.0–100.0)

## 2021-03-21 LAB — TROPONIN I (HIGH SENSITIVITY)
Troponin I (High Sensitivity): 87 ng/L — ABNORMAL HIGH (ref <18)
Troponin I (High Sensitivity): 90 ng/L — ABNORMAL HIGH (ref <18)

## 2021-03-21 LAB — CBC
HCT: 39.9 % (ref 36.0–46.0)
Hemoglobin: 13.2 g/dL (ref 12.0–15.0)
MCH: 29.4 pg (ref 26.0–34.0)
MCHC: 33.1 g/dL (ref 30.0–36.0)
MCV: 88.9 fL (ref 80.0–100.0)
Platelets: 402 10*3/uL — ABNORMAL HIGH (ref 150–400)
RBC: 4.49 MIL/uL (ref 3.87–5.11)
RDW: 14.3 % (ref 11.5–15.5)
WBC: 16.1 10*3/uL — ABNORMAL HIGH (ref 4.0–10.5)
nRBC: 0 % (ref 0.0–0.2)

## 2021-03-21 LAB — RESP PANEL BY RT-PCR (FLU A&B, COVID) ARPGX2
Influenza A by PCR: NEGATIVE
Influenza B by PCR: NEGATIVE
SARS Coronavirus 2 by RT PCR: NEGATIVE

## 2021-03-21 LAB — MRSA NEXT GEN BY PCR, NASAL: MRSA by PCR Next Gen: NOT DETECTED

## 2021-03-21 MED ORDER — NITROGLYCERIN 0.4 MG SL SUBL: 0.4000 mg | SUBLINGUAL_TABLET | SUBLINGUAL | Status: DC | PRN

## 2021-03-21 MED ORDER — ASPIRIN EC 81 MG PO TBEC
81.0000 mg | DELAYED_RELEASE_TABLET | Freq: Every morning | ORAL | Status: DC
  Administered 2021-03-22: 81 mg via ORAL
  Filled 2021-03-21: qty 1

## 2021-03-21 MED ORDER — ACETAMINOPHEN 325 MG PO TABS
650.0000 mg | ORAL_TABLET | ORAL | Status: DC | PRN
  Administered 2021-03-22 – 2021-03-23 (×4): 650 mg via ORAL
  Filled 2021-03-21 (×3): qty 2

## 2021-03-21 MED ORDER — ONDANSETRON HCL 4 MG/2ML IJ SOLN: 4.0000 mg | Freq: Four times a day (QID) | INTRAMUSCULAR | Status: DC | PRN

## 2021-03-21 MED ORDER — LORATADINE 10 MG PO TABS
10.0000 mg | ORAL_TABLET | Freq: Every day | ORAL | Status: DC
  Administered 2021-03-21 – 2021-03-30 (×9): 10 mg via ORAL
  Filled 2021-03-21 (×9): qty 1

## 2021-03-21 MED ORDER — AMLODIPINE BESYLATE 2.5 MG PO TABS
2.5000 mg | ORAL_TABLET | Freq: Every day | ORAL | Status: DC
  Administered 2021-03-21: 2.5 mg via ORAL
  Filled 2021-03-21: qty 1

## 2021-03-21 MED ORDER — ENOXAPARIN SODIUM 40 MG/0.4ML IJ SOSY
40.0000 mg | PREFILLED_SYRINGE | INTRAMUSCULAR | Status: DC
Start: 2021-03-21 — End: 2021-03-22
  Filled 2021-03-21: qty 0.4

## 2021-03-21 MED ORDER — ASPIRIN 81 MG PO CHEW
324.0000 mg | CHEWABLE_TABLET | Freq: Once | ORAL | Status: AC
  Administered 2021-03-21: 324 mg via ORAL
  Filled 2021-03-21: qty 4

## 2021-03-21 NOTE — Progress Notes (Signed)
Progress Note  Patient Name: Kimberly Calhoun Date of Encounter: 03/22/2021  [facility] Cardiologist: Lance Muss, MD   Subjective   Patient states her breathing is getting better. She is anxious to have her procedures completed and is hoping to go home soon.  Inpatient Medications    Scheduled Meds:  amLODipine  2.5 mg Oral Daily   aspirin EC  81 mg Oral q morning   enoxaparin (LOVENOX) injection  40 mg Subcutaneous Q24H   loratadine  10 mg Oral Daily   Continuous Infusions:  PRN Meds: acetaminophen, nitroGLYCERIN, ondansetron (ZOFRAN) IV   Vital Signs    Vitals:   03/21/21 2300 03/22/21 0300 03/22/21 0500 03/22/21 0700  BP: 120/64 (!) 141/78 135/69 (!) 148/76  Pulse: 80 84 72 76  Resp: 15 19 16 19   Temp: 98 F (36.7 C) 97.9 F (36.6 C)  97.9 F (36.6 C)  TempSrc: Oral Oral  Oral  SpO2: 95% 94%  93%  Weight:  64.7 kg    Height:        Intake/Output Summary (Last 24 hours) at 03/22/2021 0841 Last data filed at 03/21/2021 1821 Gross per 24 hour  Intake 180 ml  Output --  Net 180 ml   Last 3 Weights 03/22/2021 03/21/2021 03/21/2021  Weight (lbs) 142 lb 10.2 oz 139 lb 5.3 oz 136 lb  Weight (kg) 64.7 kg 63.2 kg 61.689 kg      Telemetry    NSR, rare PVCs - Personally Reviewed  ECG    NSR with LVH - Personally Reviewed  Physical Exam   GEN: No acute distress.   Neck: No JVD Cardiac: RRR, soft systolic murmur. No rubs or gallops.  Respiratory: Clear to auscultation bilaterally. GI: Soft, nontender, non-distended  MS: No edema; No deformity. Neuro:  Nonfocal  Psych: Normal affect   Labs    High Sensitivity Troponin:   Recent Labs  Lab 03/21/21 1006 03/21/21 1221  TROPONINIHS 87* 90*      Chemistry Recent Labs  Lab 03/21/21 1006 03/21/21 1548 03/22/21 0149  NA 138  --  137  K 3.8  --  4.4  CL 102  --  103  CO2 26  --  27  GLUCOSE 95  --  92  BUN 21  --  14  CREATININE 0.93 0.90 0.86  CALCIUM 9.7  --  9.4  GFRNONAA >60 >60 >60   ANIONGAP 10  --  7     Hematology Recent Labs  Lab 03/21/21 1006 03/21/21 1548 03/22/21 0149  WBC 16.1* 14.6* 10.6*  RBC 4.49 4.36 4.31  HGB 13.2 12.9 12.9  HCT 39.9 38.7 38.8  MCV 88.9 88.8 90.0  MCH 29.4 29.6 29.9  MCHC 33.1 33.3 33.2  RDW 14.3 14.1 14.1  PLT 402* 366 374    BNP Recent Labs  Lab 03/21/21 1006  BNP 252.7*     DDimer No results for input(s): DDIMER in the last 168 hours.   Radiology    DG Chest 2 View  Result Date: 03/21/2021 CLINICAL DATA:  80 year old female with history of chest pain and shortness of breath. EXAM: CHEST - 2 VIEW COMPARISON:  No priors. FINDINGS: Lung volumes are normal. No consolidative airspace disease. There is some volume loss and architectural distortion in the region of the right middle lobe. No pleural effusions. No pneumothorax. No pulmonary nodule or mass noted. Pulmonary vasculature and the cardiomediastinal silhouette are within normal limits. Atherosclerotic calcifications in the thoracic aorta. IMPRESSION: 1. Volume  loss and architectural distortion in the region of the right middle lobe which could reflect areas of chronic post infectious or inflammatory scarring, however, no prior studies are available for comparison. Repeat standing PA and lateral chest radiograph is recommended in 1-2 months to ensure the stability of this finding. 2. Aortic atherosclerosis. Electronically Signed   By: Trudie Reed M.D.   On: 03/21/2021 10:33    Cardiac Studies   MR Cardiac Stress test 01-12-2013: INTERPRETATION  Adenosine (pharmacologic) cardiac MR imaging was performed.  IV Gadolinium  was administered for delayed enhancement imaging.  Normal resting ECG.    Patient had chest pressure, shortness of breath and numbness in extremities  during infusion of adenosine.  Symptoms entirely resolved after infusion  protocol was completed.  No ECG changes post-study.    Summary Findings:    1-Normal LV size, systolic function and wall motion;  calculated LVEF=69%.    2-Normal RV size and function.  3-Normal biatrial size.  4-No significant  valvular regurgitations noted.  5-With administration of IV adenosine,  first-pass stress perfusion imaging showed no perfusion defects.  6-Delayed  enhancement imaging showed no areas of myocardial hyperenhancement.  7-No  pericardial effusion.      Overall, this is a negative adenosine stress CMRI study for perfusion  defects.                                                                                               Patient Profile     80 y.o. female with history of HLD, macular degeneration, and CKD stage 3a, who presents with progressive worsening dyspnea on exertion and chest pain found to have mildly elevated BNP and torponin prompting admission to Cardiology for TTE and ischemic work-up.  Assessment & Plan    #Chest pain in patient with no known CAD: patient presented with intermittent chest pain predominately post-prandial (lunch/dinner) lasting for 1 minutes with a more prolonged episode lasting 5 minutes 03/20/21 prompting ED evaluation. Also notes progressive dyspnea on exertion and mild chest discomfort with "a lot of exertion." EKG with non-specific ST-T wave changes. HsTrop mildly elevated with flat trend not c/w ACS. BNP mildly elevated but no evidence of volume overload on CXR or exam. Symptoms atypical for ischemia, however given mild elevation in trop and recurrent visits for chest pain/DOE, will check myoview and TTE. If negative, will refer to GI for further work-up. - Follow-up TTE - Plan for myoview today - If above reassuring, can go home today with plans for GI evaluation for chest discomfort given post-prandial worsening   #Elevated blood pressure without diagnosis of HTN: Suspect white coat syndrome is contributing, however BP is generally in the 130s-140s/80s at home per patient report. She has evidence of LVH on EKG. Suspect underlying HTN - Continue amlodipine 2.5mg   daily and monitor response   #HLD:  LDL 180, TC 270, TG 140, HDL 62. Has listed allergies to statins. Will start zetia. Consider [facility] as out-patient -Intolerant to statins -Start zetia 10mg  daily -Consider [facility] as out-patient -Follow-up with Dr. for further management   #Leukocytosis:  Resolved. WBC mildly  elevated on admission. Suspect reactive from recent steroid use. No infectious complaints - Improving; continue to trend   #Abnormal CXR:  CXR showed volume loss and architectural distortion in the RML c/f chronic post infectious or inflammatory scarring. Recommended for repeat standing PA/lateral in 1-2 months. - Will need repeat CXR in 1-2 months  If myoview/TTE reassuring, okay to discharge home today with plans to follow-up with Dr. Eldridge Dace as well as refer to GI  For questions or updates, please contact [facility] Please consult [email or web address] for contact info under        Signed, Meriam Sprague, MD  03/22/2021, 8:41 AM

## 2021-03-21 NOTE — ED Triage Notes (Signed)
Pt arrives to ED with c/o of chest pressure x1 month. Pt reports the pain is intermittent and described as a burning sensation. Yesterday the pain seemed to last longer and radiated to both arms. Pt also reports dyspnea on exertion within last month.

## 2021-03-21 NOTE — ED Notes (Signed)
Report called to care link 

## 2021-03-21 NOTE — ED Notes (Signed)
Pt stated that she felt fine and didn't need anything at this time

## 2021-03-21 NOTE — H&P (Addendum)
Cardiology Admission History and Physical:   Patient ID: Kimberly Calhoun MRN: [id number]; DOB: April 11, 1941   Admission date: 03/21/2021  PCP:  Ileana Ladd, MD   [facility] Providers Cardiologist:  Lance Muss, MD        Chief Complaint:  Chest pain  Patient Profile:   Kimberly Calhoun is a 80 y.o. female with a PMH of HLD, macular degeneration, and CKD stage 3a, who is being seen 03/21/2021 for the evaluation of chest pain.  History of Present Illness:   Kimberly Calhoun was seen by Dr. Eldridge Dace 01/17/2021 at which time she had recent improvement in her chest wall pain after receiving treatment from her chiropractor, felt to be related to chest wall muscle spasms. Additionally she had improvement in her DOE felt to be 2/2 allergic reaction to getting a cat, which she got rid of. They discussed pursuing ischemic work-up, however with improvement in symptoms decision made to hold off and consider coronary CTA if recurrent symptoms. Her last ischemic evaluation appears to be a MR cardiac stress test in 2014 which was negative, precipitated by an abnormal POET.   She presented to [facility] 03/21/21 with complaints of a prolonged episode of chest pain radiating to her arms on 03/20/21. She reported intermittent burning chest discomfort, frequently after she eats lunch/dinner, lasting for <1 minute. CP typically resolves with deep breathing exercises. She reported having an episode yesterday evening following dinner which lasted almost 5 minutes with radiation of pain down bilateral arms. She called her PCP this morning and was advised to present to the ED for further evaluation.   ED course: hypertensive, otherwise VSS. Labs notable for K 3.8, Cr 0.93, WBC 16.1, PLT 402, HsTrop 87>90, BNP 252.7, influenza and COVID-19 negative. EKG with sinus rhythm with rate 96 bpm, non-specific ST-T wave abnormalities, LVH, no comparison. CXR showed volume loss of the right middle lobe c/f post-infectious  or inflammatory scarring, as well as aortic atherosclerosis. She was given aspirin 324mg  in the ED and decision made to transfer to [facility] for ischemic evaluation.   At the time of this evaluation she is chest pain free. No recurrent episodes since the evening of 03/20/21. She works in her garden regularly and denies any exertional chest pain, though has had DOE for several months now which she thinks has been mildly worsening over that time frame. She denies SOB at rest or sensation of bendopnea. She denies LE edema, orthopnea, PND, palpitations, dizziness, lightheadedness, or syncope. She initially attributed her SOB to allergies and has tried OTC zyrtec, nasal sprays, more recently a short course of steroids started last Thursday, and even got rid of her cat a few months ago, though has not had any improvement in symptoms. She denies fever, cough, or recent COVID-19. She denies tobacco abuse, ETOH use, or recreational drug use.    Past Medical History:  Diagnosis Date   Arthritis    Chronic kidney disease, stage 3 (HCC)    Headache    Hypercholesteremia    Hyperlipidemia    Osteopenia    Personal history of colonic polyps     Past Surgical History:  Procedure Laterality Date   ABDOMINAL HYSTERECTOMY  02/2013   COLONOSCOPY WITH PROPOFOL N/A 12/07/2014   Procedure: COLONOSCOPY WITH PROPOFOL;  Surgeon: 12/09/2014, MD;  Location: [facility] END[facility]COPY;  Service: Endoscopy;  Laterality: N/A;  ultraslim     Medications Prior to Admission: Prior to Admission medications   Medication Sig Start Date End Date Taking?  Authorizing Provider  albuterol (VENTOLIN HFA) 108 (90 Base) MCG/ACT inhaler 1-2 puffs as needed 12/05/20  Yes [provider]  albuterol (VENTOLIN HFA) 108 (90 Base) MCG/ACT inhaler SMARTSIG:1-2 Puff(s) By Mouth Every 4 Hours PRN 12/05/20  Yes [provider]  aspirin EC 81 MG tablet Take 81 mg by mouth every morning.   Yes [provider]  CALCIUM PO Take 1 tablet by  mouth at bedtime as needed.   Yes [provider]  cetirizine (ZYRTEC) 10 MG tablet Take 10 mg by mouth daily.   Yes [provider]  Cholecalciferol (VITAMIN D PO) Take 1 tablet by mouth every morning.   Yes [provider]  Cyanocobalamin 5000 MCG SUBL Place under the tongue.   Yes [provider]  Multiple Vitamin (MULTIVITAMIN WITH MINERALS) TABS tablet Take 1 tablet by mouth at bedtime. Centrum silver.   Yes [provider]  Propylene Glycol (SYSTANE BALANCE OP) Apply 1 drop to eye daily as needed (dry eyes.).   Yes [provider]  vitamin C (ASCORBIC ACID) 500 MG tablet Take 500 mg by mouth every morning.   Yes [provider]  vitamin E 400 UNIT capsule Take 400 Units by mouth every morning.   Yes [provider]  zinc gluconate 50 MG tablet Take 50 mg by mouth daily.   Yes [provider]  acetaminophen (TYLENOL) 500 MG tablet Take 500 mg by mouth every 6 (six) hours as needed for moderate pain or headache.    [provider]  ibuprofen (ADVIL,MOTRIN) 200 MG tablet Take 200 mg by mouth every 6 (six) hours as needed for headache or moderate pain.    [provider]     Allergies:    Allergies  Allergen Reactions   Macrobid [Nitrofurantoin] Other (See Comments)    Cold chills, muscle weakness.   Mold Extract [Trichophyton] Other (See Comments)    Allergies.    Statins Other (See Comments)   Other Other (See Comments)    Allergies.   Mold in Cheese - stuffy nose and headache  Chocolate- stuffy nose and headache  Mildew - stuffy nose and headache    Social History:   Social History   Socioeconomic History   Marital status: Married    Spouse name: Not on file   Number of children: Not on file   Years of education: Not on file   Highest education level: Not on file  Occupational History   Not on file  Tobacco Use   Smoking status: Never   Smokeless tobacco: Never  Substance  and Sexual Activity   Alcohol use: No   Drug use: No   Sexual activity: Not on file  Other Topics Concern   Not on file  Social History Narrative   Not on file   Social Determinants of Health   Financial Resource Strain: Not on file  Food Insecurity: Not on file  Transportation Needs: Not on file  Physical Activity: Not on file  Stress: Not on file  Social Connections: Not on file  Intimate Partner Violence: Not on file    Family History:   The patient's family history includes Diabetes in her brother; Heart disease in her father. There is no history of Breast cancer.    ROS:  Please see the history of present illness.  All other ROS reviewed and negative.     Physical Exam/Data:   Vitals:   03/21/21 1145 03/21/21 1200 03/21/21 1300 03/21/21 1526  BP: Marland Kitchen)  166/113 (!) 160/83 (!) 162/85 (!) 169/78  Pulse: 90 85 81 86  Resp: 18 15 15 14   Temp:    98.5 F (36.9 C)  TempSrc:    Oral  SpO2: 99% 98% 100% 95%  Weight:    63.2 kg  Height:    5\' 3"  (1.6 m)   No intake or output data in the 24 hours ending 03/21/21 1648 Last 3 Weights 03/21/2021 03/21/2021 01/17/2021  Weight (lbs) 139 lb 5.3 oz 136 lb 140 lb 12.8 oz  Weight (kg) 63.2 kg 61.689 kg 63.866 kg     Body mass index is 24.68 kg/m.  General:  Well nourished, well developed, in no acute distress HEENT: sclera anicteric Neck: no JVD Vascular: No carotid bruits; distal pulses 2+ bilaterally   Cardiac:  normal S1, S2; RRR; no obvious murmurs, rubs, or gallops Lungs:  clear to auscultation bilaterally, no wheezing, rhonchi or rales  Abd: soft, nontender, no hepatomegaly  Ext: no edema Musculoskeletal:  No deformities, BUE and BLE strength normal and equal Skin: warm and dry  Neuro:  CNs 2-12 intact, no focal abnormalities noted Psych:  Normal affect    EKG:  sinus rhythm with rate 96 bpm, non-specific ST-T wave abnormalities, LVH, no comparison  Relevant CV Studies: MR Cardiac Stress test 2014: INTERPRETATION   Adenosine (pharmacologic) cardiac MR imaging was performed.  IV Gadolinium  was administered for delayed enhancement imaging.  Normal resting ECG.    Patient had chest pressure, shortness of breath and numbness in extremities  during infusion of adenosine.  Symptoms entirely resolved after infusion  protocol was completed.  No ECG changes post-study.    Summary Findings:    1-Normal LV size, systolic function and wall motion; calculated LVEF=69%.    2-Normal RV size and function.  3-Normal biatrial size.  4-No significant  valvular regurgitations noted.  5-With administration of IV adenosine,  first-pass stress perfusion imaging showed no perfusion defects.  6-Delayed  enhancement imaging showed no areas of myocardial hyperenhancement.  7-No  pericardial effusion.      Overall, this is a negative adenosine stress CMRI study for perfusion  defects.                                                                                                 Laboratory Data:  High Sensitivity Troponin:   Recent Labs  Lab 03/21/21 1006 03/21/21 1221  TROPONINIHS 87* 90*      Chemistry Recent Labs  Lab 03/21/21 1006  NA 138  K 3.8  CL 102  CO2 26  GLUCOSE 95  BUN 21  CREATININE 0.93  CALCIUM 9.7  GFRNONAA >60  ANIONGAP 10    No results for input(s): PROT, ALBUMIN, AST, ALT, ALKPHOS, BILITOT in the last 168 hours. Hematology Recent Labs  Lab 03/21/21 1006  WBC 16.1*  RBC 4.49  HGB 13.2  HCT 39.9  MCV 88.9  MCH 29.4  MCHC 33.1  RDW 14.3  PLT 402*   BNP Recent Labs  Lab 03/21/21 1006  BNP 252.7*    DDimer No results for input(s): DDIMER in  the last 168 hours.   Radiology/Studies:  DG Chest 2 View  Result Date: 03/21/2021 CLINICAL DATA:  80 year old female with history of chest pain and shortness of breath. EXAM: CHEST - 2 VIEW COMPARISON:  No priors. FINDINGS: Lung volumes are normal. No consolidative airspace disease. There is some volume loss and architectural  distortion in the region of the right middle lobe. No pleural effusions. No pneumothorax. No pulmonary nodule or mass noted. Pulmonary vasculature and the cardiomediastinal silhouette are within normal limits. Atherosclerotic calcifications in the thoracic aorta. IMPRESSION: 1. Volume loss and architectural distortion in the region of the right middle lobe which could reflect areas of chronic post infectious or inflammatory scarring, however, no prior studies are available for comparison. Repeat standing PA and lateral chest radiograph is recommended in 1-2 months to ensure the stability of this finding. 2. Aortic atherosclerosis. Electronically Signed   By: Trudie Reed M.D.   On: 03/21/2021 10:33     Assessment and Plan:   1. Chest pain in patient with no known CAD: patient presented with intermittent chest pain predominately post-prandial (lunch/dinner) lasting for 1 minutes with a more prolonged episode lasting 5 minutes 03/20/21 prompting ED evaluation. No exertional chest pain, though has had some progressive DOE. EKG with non-specific ST-T wave changes. HsTrop mildly elevated with flat trend not c/w ACS. BNP mildly elevated but no evidence of volume overload on CXR or exam. Symptoms atypical for ischemia and more c/w GI etiology.  - Will check an echocardiogram - Will check a NST in AM to r/o ischemia - Shared Decision Making/Informed Consent:The risks [chest pain, shortness of breath, cardiac arrhythmias, dizziness, blood pressure fluctuations, myocardial infarction, stroke/transient ischemic attack, nausea, vomiting, allergic reaction, radiation exposure, metallic taste sensation and life-threatening complications (estimated to be 1 in 10,000)], benefits (risk stratification, diagnosing coronary artery disease, treatment guidance) and alternatives of a nuclear stress test were discussed in detail with Ms. Kibby and she agrees to proceed. - Will check a FLP and A1C for risk stratification  2.  Elevated blood pressure without diagnosis of HTN: suspect white coat syndrome is contributing, however BP is generally in the 130s-140s/80s at home per patient report. She has evidence of LVH on EKG. Suspect underlying HTN - Will start amlodipine 2.5mg  daily and monitor response  3. HLD: no recent lipids on file - Will check FLP in AM - low threshold to start statin if LDL >70  4. Leukocytosis: WBC mildly elevated on admission. Suspect reactive from recent steroid use. No infectious complaints - Will trend WBC and fever curve  5. Abnormal CXR: CXR showed volume loss and architectural distortion in the RML c/f chronic post infectious or inflammatory scarring. Recommended for repeat standing PA/lateral in 1-2 months. - Will need repeat CXR in 1-2 months  Risk Assessment/Risk Scores:  { HEAR Score (for undifferentiated chest pain):  HEAR Score: 4      Severity of Illness: The appropriate patient status for this patient is OBSERVATION. Observation status is judged to be reasonable and necessary in order to provide the required intensity of service to ensure the patient's safety. The patient's presenting symptoms, physical exam findings, and initial radiographic and laboratory data in the context of their medical condition is felt to place them at decreased risk for further clinical deterioration. Furthermore, it is anticipated that the patient will be medically stable for discharge from the hospital within 2 midnights of admission. The following factors support the patient status of observation.   " The patient's  presenting symptoms include intermittent chest pain and DOE  " The physical exam findings include benign cardiopulmonary exam.. " The initial radiographic and laboratory data are EKG with non-specific ST-T wave abnormalities. HsTrop mildly elevated.   For questions or updates, please contact [facility] Please consult [email or web address] for contact info under     Signed, Beatriz Stallion, PA-C  03/21/2021 4:48 PM   Patient seen and examined and agree with Judy Pimple, PA-C as detailed above.  In brief, the patient is a 80 year old female with history of HLD, macular degeneration, and CKD stage 3a, who presents with progressive worsening dyspnea on exertion and chest pain found to have mildly elevated BNP and torponin prompting admission to Cardiology for TTE and ischemic work-up.  Patient has had ongoing SOB and chest discomfort which was thought to be due to significant allergies. She was initially improving but started to get progressively worse over the past couple of weeks. She called her PCP with recurrent chest pain and was instructed to go to the ER for further evaluation.  In the ER, HsTrop 87>90, BNP 252.7, influenza and COVID-19 negative. EKG with sinus rhythm with rate 96 bpm, non-specific ST-T wave abnormalities, LVH, no comparison. CXR showed volume loss of the right middle lobe c/f post-infectious or inflammatory scarring, as well as aortic atherosclerosis.  Currently, the patient states she has worsening dyspnea on exertion and chest discomfort if "she really exerts herself" or after eating lunch. Symptoms are somewhat atypical, however, given exertional nature and progressive worsening, will check TTE and myoview to assess for cardiac etiology.  GEN: No acute distress.   Neck: No JVD Cardiac: RRR, soft systolic murmur. No rubs or gallops Respiratory: Clear to auscultation bilaterally. GI: Soft, nontender, non-distended  MS: No edema; No deformity. Neuro:  Nonfocal  Psych: Normal affect   Plan: -Check myoview to assess for ischemia -Check TTE given mildly elevated BNP -Start low dose amlodipine 2.5mg  daily for HTN -Follow-up lipid profile for risk stratification -Leukocytosis likely secondary to steroids with no current infectious symptoms, will monitor -Will need repeat CXR in 1-2 months for monitoring of RML chronic post-infectious vs  inflammatory scarring  Laurance Flatten, MD

## 2021-03-21 NOTE — ED Provider Notes (Signed)
[facility] EMERGENCY DEPARTMENT Provider Note  CSN: [id number] Arrival date & time: 03/21/21 3785    History Chief Complaint  Patient presents with   Chest Pain    Kimberly Calhoun is a 80 y.o. female with history of HLD but no HTN or DM reports she has had difficulty with her breathing for the last several months. She had been attributing her symptoms to her allergies and even got rid of her cat. She has had occasional chest discomfort she describes as a burning that happens usually after she eats. The SOB happens with exertion at times. She was seen by Cardiology in May 2022 and at that time her symptoms were minimal and further testing was deferred. She saw her PCP last week and was given a course of steroids for possible allergies but has not had any improvement. She is currently symptom free.    Past Medical History:  Diagnosis Date   Arthritis    Chronic kidney disease, stage 3 (HCC)    Headache    Hypercholesteremia    Hyperlipidemia    Osteopenia    Personal history of colonic polyps     Past Surgical History:  Procedure Laterality Date   ABDOMINAL HYSTERECTOMY  02/2013   COLONOSCOPY WITH PROPOFOL N/A 12/07/2014   Procedure: COLONOSCOPY WITH PROPOFOL;  Surgeon: Bernette Redbird, MD;  Location: [facility] ENDOSCOPY;  Service: Endoscopy;  Laterality: N/A;  ultraslim    Family History  Problem Relation Age of Onset   Heart disease Father    Diabetes Brother    Breast cancer Neg Hx     Social History   Tobacco Use   Smoking status: Never   Smokeless tobacco: Never  Substance Use Topics   Alcohol use: No   Drug use: No     Home Medications Prior to Admission medications   Medication Sig Start Date End Date Taking? Authorizing Provider  albuterol (VENTOLIN HFA) 108 (90 Base) MCG/ACT inhaler 1-2 puffs as needed 12/05/20  Yes [provider]  albuterol (VENTOLIN HFA) 108 (90 Base) MCG/ACT inhaler SMARTSIG:1-2 Puff(s) By Mouth Every 4 Hours PRN 12/05/20   Yes [provider]  aspirin EC 81 MG tablet Take 81 mg by mouth every morning.   Yes [provider]  CALCIUM PO Take 1 tablet by mouth at bedtime as needed.   Yes [provider]  cetirizine (ZYRTEC) 10 MG tablet Take 10 mg by mouth daily.   Yes [provider]  Cholecalciferol (VITAMIN D PO) Take 1 tablet by mouth every morning.   Yes [provider]  Cyanocobalamin 5000 MCG SUBL Place under the tongue.   Yes [provider]  Multiple Vitamin (MULTIVITAMIN WITH MINERALS) TABS tablet Take 1 tablet by mouth at bedtime. Centrum silver.   Yes [provider]  Propylene Glycol (SYSTANE BALANCE OP) Apply 1 drop to eye daily as needed (dry eyes.).   Yes [provider]  vitamin C (ASCORBIC ACID) 500 MG tablet Take 500 mg by mouth every morning.   Yes [provider]  vitamin E 400 UNIT capsule Take 400 Units by mouth every morning.   Yes [provider]  zinc gluconate 50 MG tablet Take 50 mg by mouth daily.   Yes [provider]  acetaminophen (TYLENOL) 500 MG tablet Take 500 mg by mouth every 6 (six) hours as needed for moderate pain or headache.    [provider]  ibuprofen (ADVIL,MOTRIN) 200 MG tablet Take 200 mg by mouth every  6 (six) hours as needed for headache or moderate pain.    [provider]     Allergies    Macrobid [nitrofurantoin], Mold extract [trichophyton], Statins, and Other   Review of Systems   Review of Systems A comprehensive review of systems was completed and negative except as noted in HPI.    Physical Exam BP (!) 169/78 (BP Location: Right Arm)   Pulse 86   Temp 98.5 F (36.9 C) (Oral)   Resp 14   Ht 5\' 3"  (1.6 m)   Wt 63.2 kg   SpO2 95%   BMI 24.68 kg/m   Physical Exam Vitals and nursing note reviewed.  Constitutional:      Appearance: Normal appearance.  HENT:     Head: Normocephalic and atraumatic.     Nose: Nose normal.      Mouth/Throat:     Mouth: Mucous membranes are moist.  Eyes:     Extraocular Movements: Extraocular movements intact.     Conjunctiva/sclera: Conjunctivae normal.  Cardiovascular:     Rate and Rhythm: Normal rate.  Pulmonary:     Effort: Pulmonary effort is normal.     Breath sounds: Normal breath sounds.  Abdominal:     General: Abdomen is flat.     Palpations: Abdomen is soft.     Tenderness: There is no abdominal tenderness.  Musculoskeletal:        General: No swelling. Normal range of motion.     Cervical back: Neck supple.  Skin:    General: Skin is warm and dry.  Neurological:     General: No focal deficit present.     Mental Status: She is alert.  Psychiatric:        Mood and Affect: Mood normal.     ED Results / Procedures / Treatments   Labs (all labs ordered are listed, but only abnormal results are displayed) Labs Reviewed  CBC - Abnormal; Notable for the following components:      Result Value   WBC 16.1 (*)    Platelets 402 (*)    All other components within normal limits  BRAIN NATRIURETIC PEPTIDE - Abnormal; Notable for the following components:   B Natriuretic Peptide 252.7 (*)    All other components within normal limits  TROPONIN I (HIGH SENSITIVITY) - Abnormal; Notable for the following components:   Troponin I (High Sensitivity) 87 (*)    All other components within normal limits  TROPONIN I (HIGH SENSITIVITY) - Abnormal; Notable for the following components:   Troponin I (High Sensitivity) 90 (*)    All other components within normal limits  RESP PANEL BY RT-PCR (FLU A&B, COVID) ARPGX2  MRSA NEXT GEN BY PCR, NASAL  BASIC METABOLIC PANEL  CBC  CREATININE, SERUM    EKG EKG Interpretation  Date/Time:  Tuesday March 21 2021 09:59:46 EDT Ventricular Rate:  96 PR Interval:  145 QRS Duration: 88 QT Interval:  337 QTC Calculation: 426 R Axis:   46 Text Interpretation: Sinus rhythm Consider left ventricular hypertrophy Anterior Q waves,  possibly due to LVH No old tracing to compare Confirmed by 05[birth year]1988 [phone]) on 03/21/2021 10:03:07 AM   Radiology DG Chest 2 View  Result Date: 03/21/2021 CLINICAL DATA:  80 year old female with history of chest pain and shortness of breath. EXAM: CHEST - 2 VIEW COMPARISON:  No priors. FINDINGS: Lung volumes are normal. No consolidative airspace disease. There is some volume loss and architectural distortion in the region of the right middle  lobe. No pleural effusions. No pneumothorax. No pulmonary nodule or mass noted. Pulmonary vasculature and the cardiomediastinal silhouette are within normal limits. Atherosclerotic calcifications in the thoracic aorta. IMPRESSION: 1. Volume loss and architectural distortion in the region of the right middle lobe which could reflect areas of chronic post infectious or inflammatory scarring, however, no prior studies are available for comparison. Repeat standing PA and lateral chest radiograph is recommended in 1-2 months to ensure the stability of this finding. 2. Aortic atherosclerosis. Electronically Signed   By: Trudie Reed M.D.   On: 03/21/2021 10:33    Procedures .Critical Care  Date/Time: 03/21/2021 3:43 PM Performed by: Pollyann Savoy, MD Authorized by: Pollyann Savoy, MD   Critical care provider statement:    Critical care time (minutes):  45   Critical care time was exclusive of:  Separately billable procedures and treating other patients   Critical care was necessary to treat or prevent imminent or life-threatening deterioration of the following conditions:  Cardiac failure   Critical care was time spent personally by me on the following activities:  Discussions with consultants, evaluation of patient's response to treatment, examination of patient, ordering and performing treatments and interventions, ordering and review of laboratory studies, ordering and review of radiographic studies, pulse oximetry, re-evaluation of patient's  condition, obtaining history from patient or surrogate and review of old charts  Medications Ordered in the ED Medications  aspirin EC tablet 81 mg (has no administration in time range)  loratadine (CLARITIN) tablet 10 mg (has no administration in time range)  nitroGLYCERIN (NITROSTAT) SL tablet 0.4 mg (has no administration in time range)  acetaminophen (TYLENOL) tablet 650 mg (has no administration in time range)  ondansetron (ZOFRAN) injection 4 mg (has no administration in time range)  enoxaparin (LOVENOX) injection 40 mg (has no administration in time range)  aspirin chewable tablet 324 mg (324 mg Oral Given 03/21/21 1051)     MDM Rules/Calculators/A&P MDM Patient with atypical chest pain, also having some DOE. No signs of fluid overload on exam. She is feeling well now. Will check labs and CXR and re-evaluate.   ED Course  I have reviewed the triage vital signs and the nursing notes.  Pertinent labs & imaging results that were available during my care of the patient were reviewed by me and considered in my medical decision making (see chart for details).  Clinical Course as of 03/21/21 1543  Tue Mar 21, 2021  1020 CBC wit mild leukocytosis, recently on steroids. No infectious symptoms.  [CS]  1039 CXR images reviewed. No acute process but evidence of prior scar [CS]  1040 BMP is normal.  [CS]  1045 First Trop is elevated at 87. Will discuss with Cardiology.  [CS]  1125 Spoke with Dr. Shari Prows, Cardiology, who will accept patient for admission. Covid screen ordered.  [CS]    Clinical Course User Index [CS] Pollyann Savoy, MD    Final Clinical Impression(s) / ED Diagnoses Final diagnoses:  NSTEMI (non-ST elevated myocardial infarction) [facility])    Rx / DC Orders ED Discharge Orders     None        Pollyann Savoy, MD 03/21/21 [id number]

## 2021-03-22 ENCOUNTER — Ambulatory Visit (HOSPITAL_COMMUNITY): Admit: 2021-03-22 | Payer: Medicare Other | Admitting: Cardiovascular Disease

## 2021-03-22 ENCOUNTER — Observation Stay (HOSPITAL_COMMUNITY): Payer: Medicare Other

## 2021-03-22 ENCOUNTER — Encounter (HOSPITAL_COMMUNITY)
Admission: EM | Disposition: A | Discharge status: Home / Self Care | Payer: Self-pay | Source: Home / Self Care | Attending: Cardiothoracic Surgery

## 2021-03-22 DIAGNOSIS — E871 Hypo-osmolality and hyponatremia: Secondary | ICD-10-CM | POA: Diagnosis not present

## 2021-03-22 DIAGNOSIS — E7800 Pure hypercholesterolemia, unspecified: Secondary | ICD-10-CM | POA: Diagnosis present

## 2021-03-22 DIAGNOSIS — I421 Obstructive hypertrophic cardiomyopathy: Secondary | ICD-10-CM | POA: Diagnosis present

## 2021-03-22 DIAGNOSIS — I10 Essential (primary) hypertension: Secondary | ICD-10-CM | POA: Diagnosis not present

## 2021-03-22 DIAGNOSIS — I214 Non-ST elevation (NSTEMI) myocardial infarction: Secondary | ICD-10-CM | POA: Diagnosis present

## 2021-03-22 DIAGNOSIS — I340 Nonrheumatic mitral (valve) insufficiency: Secondary | ICD-10-CM

## 2021-03-22 DIAGNOSIS — I700 Atherosclerosis of aorta: Secondary | ICD-10-CM | POA: Diagnosis present

## 2021-03-22 DIAGNOSIS — I2510 Atherosclerotic heart disease of native coronary artery without angina pectoris: Secondary | ICD-10-CM | POA: Diagnosis not present

## 2021-03-22 DIAGNOSIS — D62 Acute posthemorrhagic anemia: Secondary | ICD-10-CM | POA: Diagnosis not present

## 2021-03-22 DIAGNOSIS — I25119 Atherosclerotic heart disease of native coronary artery with unspecified angina pectoris: Secondary | ICD-10-CM | POA: Diagnosis not present

## 2021-03-22 DIAGNOSIS — N1831 Chronic kidney disease, stage 3a: Secondary | ICD-10-CM | POA: Diagnosis present

## 2021-03-22 DIAGNOSIS — R079 Chest pain, unspecified: Secondary | ICD-10-CM

## 2021-03-22 DIAGNOSIS — D72829 Elevated white blood cell count, unspecified: Secondary | ICD-10-CM | POA: Diagnosis present

## 2021-03-22 DIAGNOSIS — Z951 Presence of aortocoronary bypass graft: Secondary | ICD-10-CM | POA: Diagnosis not present

## 2021-03-22 DIAGNOSIS — Z01810 Encounter for preprocedural cardiovascular examination: Secondary | ICD-10-CM | POA: Diagnosis not present

## 2021-03-22 DIAGNOSIS — I422 Other hypertrophic cardiomyopathy: Secondary | ICD-10-CM | POA: Diagnosis not present

## 2021-03-22 DIAGNOSIS — T380X5A Adverse effect of glucocorticoids and synthetic analogues, initial encounter: Secondary | ICD-10-CM | POA: Diagnosis present

## 2021-03-22 DIAGNOSIS — E8770 Fluid overload, unspecified: Secondary | ICD-10-CM | POA: Diagnosis not present

## 2021-03-22 DIAGNOSIS — Z888 Allergy status to other drugs, medicaments and biological substances status: Secondary | ICD-10-CM | POA: Diagnosis not present

## 2021-03-22 DIAGNOSIS — M8580 Other specified disorders of bone density and structure, unspecified site: Secondary | ICD-10-CM | POA: Diagnosis present

## 2021-03-22 DIAGNOSIS — Z7982 Long term (current) use of aspirin: Secondary | ICD-10-CM | POA: Diagnosis not present

## 2021-03-22 DIAGNOSIS — I083 Combined rheumatic disorders of mitral, aortic and tricuspid valves: Secondary | ICD-10-CM | POA: Diagnosis present

## 2021-03-22 DIAGNOSIS — I25110 Atherosclerotic heart disease of native coronary artery with unstable angina pectoris: Secondary | ICD-10-CM | POA: Diagnosis present

## 2021-03-22 DIAGNOSIS — Z79899 Other long term (current) drug therapy: Secondary | ICD-10-CM | POA: Diagnosis not present

## 2021-03-22 DIAGNOSIS — Z8249 Family history of ischemic heart disease and other diseases of the circulatory system: Secondary | ICD-10-CM | POA: Diagnosis not present

## 2021-03-22 DIAGNOSIS — M1990 Unspecified osteoarthritis, unspecified site: Secondary | ICD-10-CM | POA: Diagnosis present

## 2021-03-22 DIAGNOSIS — Z20822 Contact with and (suspected) exposure to covid-19: Secondary | ICD-10-CM | POA: Diagnosis present

## 2021-03-22 DIAGNOSIS — Z881 Allergy status to other antibiotic agents status: Secondary | ICD-10-CM | POA: Diagnosis not present

## 2021-03-22 DIAGNOSIS — I480 Paroxysmal atrial fibrillation: Secondary | ICD-10-CM | POA: Diagnosis not present

## 2021-03-22 DIAGNOSIS — I129 Hypertensive chronic kidney disease with stage 1 through stage 4 chronic kidney disease, or unspecified chronic kidney disease: Secondary | ICD-10-CM | POA: Diagnosis present

## 2021-03-22 DIAGNOSIS — Z833 Family history of diabetes mellitus: Secondary | ICD-10-CM | POA: Diagnosis not present

## 2021-03-22 HISTORY — PX: LEFT HEART CATH AND CORONARY ANGIOGRAPHY: CATH118249

## 2021-03-22 LAB — CBC WITH DIFFERENTIAL/PLATELET
Abs Immature Granulocytes: 0.05 10*3/uL (ref 0.00–0.07)
Basophils Absolute: 0.1 10*3/uL (ref 0.0–0.1)
Basophils Relative: 1 %
Eosinophils Absolute: 0.1 10*3/uL (ref 0.0–0.5)
Eosinophils Relative: 1 %
HCT: 38.8 % (ref 36.0–46.0)
Hemoglobin: 12.9 g/dL (ref 12.0–15.0)
Immature Granulocytes: 1 %
Lymphocytes Relative: 34 %
Lymphs Abs: 3.6 10*3/uL (ref 0.7–4.0)
MCH: 29.9 pg (ref 26.0–34.0)
MCHC: 33.2 g/dL (ref 30.0–36.0)
MCV: 90 fL (ref 80.0–100.0)
Monocytes Absolute: 1 10*3/uL (ref 0.1–1.0)
Monocytes Relative: 9 %
Neutro Abs: 5.8 10*3/uL (ref 1.7–7.7)
Neutrophils Relative %: 54 %
Platelets: 374 10*3/uL (ref 150–400)
RBC: 4.31 MIL/uL (ref 3.87–5.11)
RDW: 14.1 % (ref 11.5–15.5)
WBC: 10.6 10*3/uL — ABNORMAL HIGH (ref 4.0–10.5)
nRBC: 0 % (ref 0.0–0.2)

## 2021-03-22 LAB — ECHOCARDIOGRAM COMPLETE
Height: 63 in
S' Lateral: 2.4 cm
Weight: 2282.2 oz

## 2021-03-22 LAB — BASIC METABOLIC PANEL
Anion gap: 7 (ref 5–15)
BUN: 14 mg/dL (ref 8–23)
CO2: 27 mmol/L (ref 22–32)
Calcium: 9.4 mg/dL (ref 8.9–10.3)
Chloride: 103 mmol/L (ref 98–111)
Creatinine, Ser: 0.86 mg/dL (ref 0.44–1.00)
GFR, Estimated: >60 mL/min (ref >60)
Glucose, Bld: 92 mg/dL (ref 70–99)
Potassium: 4.4 mmol/L (ref 3.5–5.1)
Sodium: 137 mmol/L (ref 135–145)

## 2021-03-22 LAB — TROPONIN I (HIGH SENSITIVITY)
Troponin I (High Sensitivity): 3248 ng/L (ref <18)
Troponin I (High Sensitivity): 3895 ng/L (ref <18)

## 2021-03-22 LAB — NM MYOCAR MULTI W/SPECT W/WALL MOTION / EF
Estimated workload: 1 METS
Exercise duration (min): 44 min
Exercise duration (sec): 58 s
MPHR: 139 {beats}/min
Peak HR: 125 {beats}/min
Percent HR: 89 %
Rest HR: 87 {beats}/min

## 2021-03-22 LAB — LIPID PANEL
Cholesterol: 270 mg/dL — ABNORMAL HIGH (ref 0–200)
HDL: 62 mg/dL (ref >40)
LDL Cholesterol: 180 mg/dL — ABNORMAL HIGH (ref 0–99)
Total CHOL/HDL Ratio: 4.4 RATIO
Triglycerides: 140 mg/dL (ref <150)
VLDL: 28 mg/dL (ref 0–40)

## 2021-03-22 LAB — HEMOGLOBIN A1C
Hgb A1c MFr Bld: 5.8 % — ABNORMAL HIGH (ref 4.8–5.6)
Mean Plasma Glucose: 119.76 mg/dL

## 2021-03-22 SURGERY — LEFT HEART CATH AND CORONARY ANGIOGRAPHY
Anesthesia: LOCAL

## 2021-03-22 MED ORDER — HEPARIN (PORCINE) IN NACL 1000-0.9 UT/500ML-% IV SOLN
INTRAVENOUS | Status: DC | PRN
  Administered 2021-03-22 (×2): 500 mL

## 2021-03-22 MED ORDER — IOHEXOL 350 MG/ML SOLN
INTRAVENOUS | Status: DC | PRN
  Administered 2021-03-22: 75 mL

## 2021-03-22 MED ORDER — REGADENOSON 0.4 MG/5ML IV SOLN
0.4000 mg | Freq: Once | INTRAVENOUS | Status: AC
  Filled 2021-03-22: qty 5

## 2021-03-22 MED ORDER — HEPARIN (PORCINE) 25000 UT/250ML-% IV SOLN
1050.0000 [IU]/h | INTRAVENOUS | Status: DC
  Administered 2021-03-23: 800 [IU]/h via INTRAVENOUS
  Administered 2021-03-23: 1050 [IU]/h via INTRAVENOUS
  Filled 2021-03-22: qty 250

## 2021-03-22 MED ORDER — LIDOCAINE HCL (PF) 1 % IJ SOLN
INTRAMUSCULAR | Status: DC | PRN
  Administered 2021-03-22: 2 mL

## 2021-03-22 MED ORDER — FENTANYL CITRATE (PF) 100 MCG/2ML IJ SOLN
INTRAMUSCULAR | Status: DC | PRN
  Administered 2021-03-22: 25 ug via INTRAVENOUS

## 2021-03-22 MED ORDER — HEPARIN SODIUM (PORCINE) 1000 UNIT/ML IJ SOLN
INTRAMUSCULAR | Status: DC | PRN
  Administered 2021-03-22: 3500 [IU] via INTRAVENOUS

## 2021-03-22 MED ORDER — SODIUM CHLORIDE 0.9% FLUSH
3.0000 mL | Freq: Two times a day (BID) | INTRAVENOUS | Status: DC
  Administered 2021-03-23 (×2): 3 mL via INTRAVENOUS

## 2021-03-22 MED ORDER — VERAPAMIL HCL 2.5 MG/ML IV SOLN
INTRAVENOUS | Status: AC
  Filled 2021-03-22: qty 2

## 2021-03-22 MED ORDER — SODIUM CHLORIDE 0.9% FLUSH
3.0000 mL | Freq: Two times a day (BID) | INTRAVENOUS | Status: DC
  Administered 2021-03-22: 3 mL via INTRAVENOUS

## 2021-03-22 MED ORDER — SODIUM CHLORIDE 0.9 % WEIGHT BASED INFUSION: 1.0000 mL/kg/h | INTRAVENOUS | Status: DC

## 2021-03-22 MED ORDER — HEPARIN (PORCINE) 25000 UT/250ML-% IV SOLN
800.0000 [IU]/h | INTRAVENOUS | Status: DC
  Administered 2021-03-22: 800 [IU]/h via INTRAVENOUS
  Filled 2021-03-22: qty 250

## 2021-03-22 MED ORDER — SODIUM CHLORIDE 0.9 % WEIGHT BASED INFUSION: 3.0000 mL/kg/h | INTRAVENOUS | Status: DC

## 2021-03-22 MED ORDER — ACETAMINOPHEN 325 MG PO TABS: 650.0000 mg | ORAL_TABLET | ORAL | Status: DC | PRN

## 2021-03-22 MED ORDER — LIDOCAINE HCL (PF) 1 % IJ SOLN
INTRAMUSCULAR | Status: AC
  Filled 2021-03-22: qty 30

## 2021-03-22 MED ORDER — NITROGLYCERIN 0.4 MG SL SUBL
0.4000 mg | SUBLINGUAL_TABLET | SUBLINGUAL | Status: DC | PRN
  Administered 2021-03-22: 0.4 mg via SUBLINGUAL

## 2021-03-22 MED ORDER — TECHNETIUM TC 99M TETROFOSMIN IV KIT
10.0000 | PACK | Freq: Once | INTRAVENOUS | Status: AC | PRN
  Administered 2021-03-22: 10 via INTRAVENOUS

## 2021-03-22 MED ORDER — ASPIRIN 81 MG PO CHEW
81.0000 mg | CHEWABLE_TABLET | Freq: Every day | ORAL | Status: DC
  Administered 2021-03-23: 81 mg via ORAL
  Filled 2021-03-22: qty 1

## 2021-03-22 MED ORDER — AMLODIPINE BESYLATE 5 MG PO TABS
5.0000 mg | ORAL_TABLET | Freq: Every day | ORAL | Status: DC
  Administered 2021-03-22 – 2021-03-23 (×2): 5 mg via ORAL
  Filled 2021-03-22 (×2): qty 1

## 2021-03-22 MED ORDER — AMINOPHYLLINE 25 MG/ML IV (NUC MED): 75.0000 mg | Freq: Once | INTRAVENOUS | Status: AC

## 2021-03-22 MED ORDER — ACETAMINOPHEN 325 MG PO TABS
ORAL_TABLET | ORAL | Status: AC
  Filled 2021-03-22: qty 2

## 2021-03-22 MED ORDER — SODIUM CHLORIDE 0.9 % IV SOLN: 250.0000 mL | INTRAVENOUS | Status: DC | PRN

## 2021-03-22 MED ORDER — NITROGLYCERIN 0.4 MG SL SUBL
SUBLINGUAL_TABLET | SUBLINGUAL | Status: AC
  Filled 2021-03-22: qty 1

## 2021-03-22 MED ORDER — REGADENOSON 0.4 MG/5ML IV SOLN
INTRAVENOUS | Status: AC
  Administered 2021-03-22: 0.4 mg via INTRAVENOUS
  Filled 2021-03-22: qty 5

## 2021-03-22 MED ORDER — SODIUM CHLORIDE 0.9% FLUSH: 3.0000 mL | Freq: Two times a day (BID) | INTRAVENOUS | Status: DC

## 2021-03-22 MED ORDER — SODIUM CHLORIDE 0.9 % IV SOLN: INTRAVENOUS | Status: DC

## 2021-03-22 MED ORDER — ASPIRIN 81 MG PO CHEW: 81.0000 mg | CHEWABLE_TABLET | ORAL | Status: DC

## 2021-03-22 MED ORDER — ONDANSETRON HCL 4 MG/2ML IJ SOLN: 4.0000 mg | Freq: Four times a day (QID) | INTRAMUSCULAR | Status: DC | PRN

## 2021-03-22 MED ORDER — SODIUM CHLORIDE 0.9% FLUSH: 3.0000 mL | INTRAVENOUS | Status: DC | PRN

## 2021-03-22 MED ORDER — EZETIMIBE 10 MG PO TABS
10.0000 mg | ORAL_TABLET | Freq: Every day | ORAL | Status: DC
  Administered 2021-03-23 – 2021-03-30 (×6): 10 mg via ORAL
  Filled 2021-03-22 (×7): qty 1

## 2021-03-22 MED ORDER — ALUM & MAG HYDROXIDE-SIMETH 200-200-20 MG/5ML PO SUSP
30.0000 mL | Freq: Four times a day (QID) | ORAL | Status: DC | PRN
  Administered 2021-03-22: 30 mL via ORAL
  Filled 2021-03-22: qty 30

## 2021-03-22 MED ORDER — MIDAZOLAM HCL 2 MG/2ML IJ SOLN
INTRAMUSCULAR | Status: DC | PRN
  Administered 2021-03-22 (×2): 1 mg via INTRAVENOUS

## 2021-03-22 MED ORDER — HYDRALAZINE HCL 20 MG/ML IJ SOLN
10.0000 mg | Freq: Once | INTRAMUSCULAR | Status: AC
  Administered 2021-03-22: 10 mg via INTRAVENOUS

## 2021-03-22 MED ORDER — AMINOPHYLLINE 25 MG/ML IV SOLN
INTRAVENOUS | Status: AC
  Administered 2021-03-22: 75 mg via INTRAVENOUS
  Filled 2021-03-22: qty 10

## 2021-03-22 MED ORDER — LABETALOL HCL 5 MG/ML IV SOLN: 10.0000 mg | INTRAVENOUS | Status: AC | PRN

## 2021-03-22 MED ORDER — FENTANYL CITRATE (PF) 100 MCG/2ML IJ SOLN
INTRAMUSCULAR | Status: AC
  Filled 2021-03-22: qty 2

## 2021-03-22 MED ORDER — HEPARIN (PORCINE) IN NACL 1000-0.9 UT/500ML-% IV SOLN
INTRAVENOUS | Status: AC
  Filled 2021-03-22: qty 500

## 2021-03-22 MED ORDER — HYDRALAZINE HCL 20 MG/ML IJ SOLN: 10.0000 mg | INTRAMUSCULAR | Status: AC | PRN

## 2021-03-22 MED ORDER — HEPARIN BOLUS VIA INFUSION
3850.0000 [IU] | Freq: Once | INTRAVENOUS | Status: AC
  Administered 2021-03-22: 3850 [IU] via INTRAVENOUS
  Filled 2021-03-22: qty 3850

## 2021-03-22 MED ORDER — NITROGLYCERIN IN D5W 200-5 MCG/ML-% IV SOLN
0.0000 ug/min | INTRAVENOUS | Status: DC
  Administered 2021-03-22: 5 ug/min via INTRAVENOUS
  Filled 2021-03-22: qty 250

## 2021-03-22 MED ORDER — VERAPAMIL HCL 2.5 MG/ML IV SOLN
INTRAVENOUS | Status: DC | PRN
  Administered 2021-03-22: 10 mL via INTRA_ARTERIAL

## 2021-03-22 MED ORDER — MIDAZOLAM HCL 2 MG/2ML IJ SOLN
INTRAMUSCULAR | Status: AC
  Filled 2021-03-22: qty 2

## 2021-03-22 MED ORDER — NITROGLYCERIN IN D5W 200-5 MCG/ML-% IV SOLN: 0.0000 ug/min | INTRAVENOUS | Status: DC

## 2021-03-22 MED ORDER — AMLODIPINE BESYLATE 5 MG PO TABS: 5.0000 mg | ORAL_TABLET | Freq: Every day | ORAL | Status: DC

## 2021-03-22 MED ORDER — HYDRALAZINE HCL 20 MG/ML IJ SOLN
INTRAMUSCULAR | Status: AC
  Filled 2021-03-22: qty 1

## 2021-03-22 SURGICAL SUPPLY — 11 items
CATH INFINITI JR4 5F (CATHETERS) ×1 IMPLANT
CATH OPTITORQUE TIG 4.0 5F (CATHETERS) ×1 IMPLANT
DEVICE RAD TR BAND REGULAR (VASCULAR PRODUCTS) ×2 IMPLANT
GLIDESHEATH SLEND SS 6F .021 (SHEATH) ×1 IMPLANT
KIT HEART LEFT (KITS) ×2 IMPLANT
PACK CARDIAC CATHETERIZATION (CUSTOM PROCEDURE TRAY) ×2 IMPLANT
SHEATH PROBE COVER 6X72 (BAG) ×2 IMPLANT
TRANSDUCER W/STOPCOCK (MISCELLANEOUS) ×2 IMPLANT
TUBING CIL FLEX 10 FLL-RA (TUBING) ×2 IMPLANT
WIRE HI TORQ VERSACORE J 260CM (WIRE) ×2 IMPLANT
WIRE MICROINTRODUCER 60CM (WIRE) ×1 IMPLANT

## 2021-03-22 NOTE — Progress Notes (Addendum)
Myoview read by radiology and results reviewed by Dr. Shari Prows. Dr. Shari Prows agrees that she has a large area of ischemia.  Chest pain secondary to the Lexiscan and significant hypertension was treated aggressively and has resolved. Her ECG has < 24mm of ST depression in some inferior and lateral leads, does not meet STEMI criteria. Start IV nitro and heparin.  Cardiac catheterization was discussed with the patient fully. The patient understands that risks include but are not limited to stroke (1 in 1000), death (1 in 1000), kidney failure [usually temporary] (1 in 500), bleeding (1 in 200), allergic reaction [possibly serious] (1 in 200).  The patient understands and is willing to proceed.    Lipid profile was reviewed with the patient as well.  She is currently refusing to take a statin.  She describes watching her husband and brother suffer through multiple side effects and deterioration of quality of life due to medications including cardiac medications.  At 1 point, she tried Crestor and was unable to tolerate it.  She said she could not get out of bed after being on it for a week.  She has been started on Zetia.  At this time, she refuses to try another statin, even a low-dose of a different one.   Cardiac catheterization scheduled for tomorrow and orders written.   Lab Results  Component Value Date   CHOL 270 (H) 03/22/2021   HDL 62 03/22/2021   LDLCALC 180 (H) 03/22/2021   TRIG 140 03/22/2021   CHOLHDL 4.4 03/22/2021    DG Chest 2 View  Result Date: 03/21/2021 CLINICAL DATA:  80 year old female with history of chest pain and shortness of breath. EXAM: CHEST - 2 VIEW COMPARISON:  No priors. FINDINGS: Lung volumes are normal. No consolidative airspace disease. There is some volume loss and architectural distortion in the region of the right middle lobe. No pleural effusions. No pneumothorax. No pulmonary nodule or mass noted. Pulmonary vasculature and the cardiomediastinal  silhouette are within normal limits. Atherosclerotic calcifications in the thoracic aorta. IMPRESSION: 1. Volume loss and architectural distortion in the region of the right middle lobe which could reflect areas of chronic post infectious or inflammatory scarring, however, no prior studies are available for comparison. Repeat standing PA and lateral chest radiograph is recommended in 1-2 months to ensure the stability of this finding. 2. Aortic atherosclerosis. Electronically Signed   By: Trudie Reed M.D.   On: 03/21/2021 10:33   NM Myocar Multi W/Spect Izetta Dakin Motion / EF  Result Date: 03/22/2021 CLINICAL DATA:  Chest pain. History of macular degeneration and chronic kidney disease, stage III A. EXAM: MYOCARDIAL IMAGING WITH SPECT (REST AND PHARMACOLOGIC-STRESS) GATED LEFT VENTRICULAR WALL MOTION STUDY LEFT VENTRICULAR EJECTION FRACTION TECHNIQUE: Standard myocardial SPECT imaging was performed after resting intravenous injection of 10 mCi Tc-22m tetrofosmin. Subsequently, intravenous infusion of Lexiscan was performed under the supervision of the Cardiology staff. At peak effect of the drug, 32.3 mCi Tc-83m tetrofosmin was injected intravenously and standard myocardial SPECT imaging was performed. Quantitative gated imaging was also performed to evaluate left ventricular wall motion, and estimate left ventricular ejection fraction. COMPARISON:  Chest radiograph 03/21/2021. FINDINGS: Perfusion: Study is mildly degraded by prominent subdiaphragmatic activity. There is a large reversible perfusion defect involving the left ventricular apex, distal segments of all walls and the mid septum. There is incomplete reversibility in the distal septum. Summed difference score is 17. Wall Motion: Diffusely abnormal wall motion with apical hypokinesis and paradoxical motion in the septum.  Left Ventricular Ejection Fraction: 59 % End diastolic volume 57 ml End systolic volume 24 ml IMPRESSION: 1. Large area of reversible  ischemia involving most of the septum, distal segments of all walls and the left ventricular apex as described. 2. Abnormal left ventricular wall motion with apical hypokinesis and paradoxical motion in the septum. 3. Left ventricular ejection fraction 59% 4. Non invasive risk stratification*: High *2012 Appropriate Use Criteria for Coronary Revascularization Focused Update: J Am Coll Cardiol. 2012;59(9):857-881. http://content.dementiazones.com.aspx?articleid=1201161 These results were discussed by telephone at the time of interpretation on 03/22/2021 at 3:07 pm with provider Theodore Demark, PA-C, who verbally acknowledged these results. Electronically Signed   By: Carey Bullocks M.D.   On: 03/22/2021 15:12    Theodore Demark, PA-C 03/22/2021 3:56 PM

## 2021-03-22 NOTE — Progress Notes (Signed)
ANTICOAGULATION CONSULT NOTE - Initial Consult  Pharmacy Consult for IV Heparin Indication: chest pain/ACS  Allergies  Allergen Reactions   Macrobid [Nitrofurantoin] Other (See Comments)    Cold chills, muscle weakness.   Mold Extract [Trichophyton] Other (See Comments)    Allergies.    Statins Other (See Comments)   Other Other (See Comments)    Allergies.   Mold in Cheese - stuffy nose and headache  Chocolate- stuffy nose and headache  Mildew - stuffy nose and headache    Patient Measurements: Height: 5\' 3"  (160 cm) Weight: 64.7 kg (142 lb 10.2 oz) IBW/kg (Calculated) : 52.4 Heparin Dosing Weight:  64.7 kg  Vital Signs: Temp: 97.9 F (36.6 C) (07/06 0700) Temp Source: Oral (07/06 0700) BP: 188/85 (07/06 1224) Pulse Rate: 113 (07/06 1204)  Labs: Recent Labs    03/21/21 1006 03/21/21 1221 03/22/21 0149  HGB 13.2  --  12.9  HCT 39.9  --  38.8  PLT 402*  --  374  CREATININE 0.93  --  0.86  TROPONINIHS 87* 90*  --     Estimated Creatinine Clearance: 46.4 mL/min (by C-G formula based on SCr of 0.86 mg/dL).   Medical History: Past Medical History:  Diagnosis Date   Arthritis    Chronic kidney disease, stage 3 (HCC)    Headache    Hypercholesteremia    Hyperlipidemia    Osteopenia    Personal history of colonic polyps     Assessment: 80 yr old woman with no known CAD was admitted on 03/21/21 with CP, troponins 87>90. Pt is S/P myoview today which showed large area of reversible ischemia involving most of the septum, distal segments of all walls and the left ventricular apex. Pharmacy is consulted for heparin dosing for ACS. Pt was no on anticoagulant PTA. Pt was ordered Lovenox for VTE prophylaxis, but has not rec'd any doses during this admission.  H/H 12.9/38.8, plt 374;  CrCl 46.4 ml/min  Goal of Therapy:  Heparin level 0.3-0.7 units/ml Monitor platelets by anticoagulation protocol: Yes   Plan:  Heparin 3850 units IV bolus X 1, followed by heparin  infusion at 800 units/hr Check heparin level in 8 hrs Monitor daily heparin level, CBC Monitor for bleeding F/U cardiac workup  02-06-1998, PharmD, BCPS, [facility] Clinical Pharmacist 03/22/2021,3:31 PM

## 2021-03-22 NOTE — H&P (View-Only) (Signed)
   Called to the beside for ongoing chest pain. Ms. Terhune had a myoview stress test today which shows a very large area of reversible ischemia of the anterior wall. Initially troponins were flat mildly elevated, however, after myoview have climbed to ~3800. She is on heparin and a nitro gtts. Despite increases in nitroglycerin, she is having recurrent chest pain. She is noted to have a sinus tachycardia as well which is concerning.  Would recommend urgent cardiac catheterization for concern of high grade proximal LAD or LM disease.  I discussed the risks, benefits and alternatives with her and she is agreeable to proceed. Case discussed with Dr. Kelly (on call interventionalist) and will proceed with urgent cardiac cath and likely revascularization.  Kass Herberger C. Tad Fancher, MD, FACC, FACP  [place]  Medical Director of the [facility] Diplomate of the American Board of Clinical Lipidology Attending Cardiologist  Direct Dial: [phone]  Fax: [phone]  Website:  [email or web address]  

## 2021-03-22 NOTE — Progress Notes (Addendum)
   Mosetta Anis presented for a nuclear stress test today.  No immediate complications.  Stress imaging is pending at this time.  Preliminary EKG findings may be listed in the chart, but the stress test result will not be finalized until perfusion imaging is complete.  1 day study, [place] to read.  Prior to testing, pt had HA, 7/10. Premedicated w/ Tylenol. W/ Lexiscan, pt had worsening HA to 9/10. Had 4/10 chest pain. headache was radiating to her neck w/ neck pain.  SBP 170s pre test, up to 199 w/ Lexiscan.   Mylanta given in case CP was GI >> no change Pt given hydralazine 10 mg IV for BP Amlodipine increased from 2.5 mg up to 5 mg qd, given in stress lab.  Aminophylline 75 mg given to reverse Lexiscan.  SL NTG x 1 given as well.  Dr Shari Prows in to assess the pt. Once meds on board, she discussed the situation w/ the patient. Decision was made to go ahead and get the images. F/u sx after that.   Theodore Demark, PA-C 03/22/2021, 12:08 PM

## 2021-03-22 NOTE — Progress Notes (Signed)
   03/22/21 1745  Clinical Encounter Type  Visited With Patient not available  Visit Type Code (Stemi)  Referral From Nurse  Consult/Referral To Chaplain   Chaplain received code stemi page. Patient is not available. No support persons present. Chaplain remains available. This note was prepared by Deneen Harts, M.Div..  For questions please contact by phone [phone].

## 2021-03-22 NOTE — Progress Notes (Signed)
At precisely around 1345 patient returned to room from stress testing. Patient looks fatigued. Shortly after return, patient reports 7/10 chest pain and states this has been there since being in the stress test. She also reports a headache of 5/10. At 1500 patient reports chest pain is better, now a 4/10 and headache is also better at 3/10. Patient refused nitroglycerin during this time. PA was paged with update on above.

## 2021-03-22 NOTE — Progress Notes (Signed)
   03/22/21 2045  Clinical Encounter Type  Visited With Patient and family together  Visit Type Code Valinda Party)  Referral From Nurse  Consult/Referral To Chaplain   This chaplain responded to code stemi. The patient was in room 1 and not available. This chaplain waited for the patient to come out of the procedure and accompanied the medical team as they rolled the patient back to her room. The chaplain offered prayer. Her daughter Misty Stanley waited at the bedside.  The patient stated she felt fine and asked for something to eat. Chaplain asked if there was anything the chaplain could do, and the patient declined. This note was prepared by Deneen Harts, M.Div..  For questions please contact by phone [phone].

## 2021-03-22 NOTE — Plan of Care (Signed)
?  Problem: Education: ?Goal: Knowledge of General Education information will improve ?Description: Including pain rating scale, medication(s)/side effects and non-pharmacologic comfort measures ?Outcome: Progressing ?  ?Problem: Health Behavior/Discharge Planning: ?Goal: Ability to manage health-related needs will improve ?Outcome: Progressing ?  ?Problem: Clinical Measurements: ?Goal: Ability to maintain clinical measurements within normal limits will improve ?Outcome: Progressing ?Goal: Diagnostic test results will improve ?Outcome: Progressing ?Goal: Cardiovascular complication will be avoided ?Outcome: Progressing ?  ?Problem: Activity: ?Goal: Risk for activity intolerance will decrease ?Outcome: Progressing ?  ?Problem: Pain Managment: ?Goal: General experience of comfort will improve ?Outcome: Progressing ?  ?

## 2021-03-22 NOTE — Progress Notes (Signed)
  Echocardiogram 2D Echocardiogram has been performed.  Delcie Roch 03/22/2021, 4:39 PM

## 2021-03-22 NOTE — Progress Notes (Signed)
Patient continued to have ongoing chest pain - Theodore Demark, cardiology PA, paged and made aware. Bjorn Loser came to bedside at 1530, 12 lead EKG obtained, heparin and nitroglycerin gtt started per orders. Patient did continue to have separate episodes of sudden crushing chest pain, rating them anywhere from 4 to 7/10 despite initiation of nitro gtt. Patient symptomatic during these episodes of chest pain and blood pressure increased at these times. Between episodes, patient would go pain free, at which point blood pressure would drop into low 100s systolic.   Three pages were sent to Laverda Page, NP on call for cardiology between 1720 and 1745 to report the above but no return pages were received. Rapid response RN, Mitzi Davenport, made aware and came to bedside to evaluate patient. Upon her arrival, patient lying in bed in no acute distress and pain free.   Critical result was called for troponin of 3248 at 1847. Dr. Rennis Golden, cardiologist on call, returned page and came to bedside to evaluate patient. Orders received for patient to go to cath lab, orders carried out, patient went emergently to cath lab. Nightshift RN, Shiwangi at bedside and updated on plan of care. Patient's daughter was also updated by patient and aware of plan.

## 2021-03-22 NOTE — Interval H&P Note (Signed)
Cath Lab Visit (complete for each Cath Lab visit)  Clinical Evaluation Leading to the Procedure:   ACS: Yes.    Non-ACS:    Anginal Classification: CCS IV  Anti-ischemic medical therapy: Maximal Therapy (2 or more classes of medications)  Non-Invasive Test Results: High-risk stress test findings: cardiac mortality >3%/year  Prior CABG: Previous CABG      History and Physical Interval Note:  03/22/2021 7:57 PM  Kimberly Calhoun  has presented today for surgery, with the diagnosis of stemi.  The various methods of treatment have been discussed with the patient and family. After consideration of risks, benefits and other options for treatment, the patient has consented to  Procedure(s): CORONARY/GRAFT ACUTE MI REVASCULARIZATION (N/A) as a surgical intervention.  The patient's history has been reviewed, patient examined, no change in status, stable for surgery.  I have reviewed the patient's chart and labs.  Questions were answered to the patient's satisfaction.     Nicki Guadalajara

## 2021-03-22 NOTE — Progress Notes (Signed)
   Called to the beside for ongoing chest pain. Kimberly Calhoun had a myoview stress test today which shows a very large area of reversible ischemia of the anterior wall. Initially troponins were flat mildly elevated, however, after myoview have climbed to ~3800. She is on heparin and a nitro gtts. Despite increases in nitroglycerin, she is having recurrent chest pain. She is noted to have a sinus tachycardia as well which is concerning.  Would recommend urgent cardiac catheterization for concern of high grade proximal LAD or LM disease.  I discussed the risks, benefits and alternatives with her and she is agreeable to proceed. Case discussed with Dr. Tresa Endo (on call interventionalist) and will proceed with urgent cardiac cath and likely revascularization.  Chrystie Nose, MD, [facility], FACP  [place]  [facility]  Medical Director of the [facility]  [facility] Diplomate of the American Board of Clinical Lipidology Attending Cardiologist  Direct Dial: [phone]  Fax: [phone]  Website:  [email or web address]

## 2021-03-23 ENCOUNTER — Inpatient Hospital Stay (HOSPITAL_COMMUNITY): Payer: Medicare Other

## 2021-03-23 ENCOUNTER — Encounter (HOSPITAL_COMMUNITY)
Admission: EM | Disposition: A | Discharge status: Home / Self Care | Payer: Self-pay | Source: Home / Self Care | Attending: Cardiothoracic Surgery

## 2021-03-23 ENCOUNTER — Encounter (HOSPITAL_COMMUNITY): Payer: Self-pay | Admitting: Cardiovascular Disease

## 2021-03-23 DIAGNOSIS — I340 Nonrheumatic mitral (valve) insufficiency: Secondary | ICD-10-CM

## 2021-03-23 DIAGNOSIS — Z01810 Encounter for preprocedural cardiovascular examination: Secondary | ICD-10-CM

## 2021-03-23 DIAGNOSIS — I25110 Atherosclerotic heart disease of native coronary artery with unstable angina pectoris: Secondary | ICD-10-CM

## 2021-03-23 DIAGNOSIS — I214 Non-ST elevation (NSTEMI) myocardial infarction: Principal | ICD-10-CM

## 2021-03-23 DIAGNOSIS — I25119 Atherosclerotic heart disease of native coronary artery with unspecified angina pectoris: Secondary | ICD-10-CM

## 2021-03-23 LAB — BASIC METABOLIC PANEL
Anion gap: 6 (ref 5–15)
BUN: 18 mg/dL (ref 8–23)
CO2: 25 mmol/L (ref 22–32)
Calcium: 8.7 mg/dL — ABNORMAL LOW (ref 8.9–10.3)
Chloride: 100 mmol/L (ref 98–111)
Creatinine, Ser: 0.89 mg/dL (ref 0.44–1.00)
GFR, Estimated: >60 mL/min (ref >60)
Glucose, Bld: 120 mg/dL — ABNORMAL HIGH (ref 70–99)
Potassium: 4.2 mmol/L (ref 3.5–5.1)
Sodium: 131 mmol/L — ABNORMAL LOW (ref 135–145)

## 2021-03-23 LAB — URINALYSIS, ROUTINE W REFLEX MICROSCOPIC
Bilirubin Urine: NEGATIVE
Glucose, UA: NEGATIVE mg/dL
Ketones, ur: NEGATIVE mg/dL
Nitrite: NEGATIVE
Protein, ur: NEGATIVE mg/dL
Specific Gravity, Urine: 1.016 (ref 1.005–1.030)
pH: 6 (ref 5.0–8.0)

## 2021-03-23 LAB — BLOOD GAS, ARTERIAL
Acid-base deficit: 1.9 mmol/L (ref 0.0–2.0)
Bicarbonate: 21.7 mmol/L (ref 20.0–28.0)
Drawn by: 55062
FIO2: 21
O2 Saturation: 97.1 %
Patient temperature: 36.9
pCO2 arterial: 32.6 mmHg (ref 32.0–48.0)
pH, Arterial: 7.438 (ref 7.350–7.450)
pO2, Arterial: 87.1 mmHg (ref 83.0–108.0)

## 2021-03-23 LAB — CBC
HCT: 37.2 % (ref 36.0–46.0)
Hemoglobin: 12.2 g/dL (ref 12.0–15.0)
MCH: 29.3 pg (ref 26.0–34.0)
MCHC: 32.8 g/dL (ref 30.0–36.0)
MCV: 89.2 fL (ref 80.0–100.0)
Platelets: 372 10*3/uL (ref 150–400)
RBC: 4.17 MIL/uL (ref 3.87–5.11)
RDW: 14 % (ref 11.5–15.5)
WBC: 11.5 10*3/uL — ABNORMAL HIGH (ref 4.0–10.5)
nRBC: 0 % (ref 0.0–0.2)

## 2021-03-23 LAB — HEPARIN LEVEL (UNFRACTIONATED)
Heparin Unfractionated: <0.1 IU/mL — ABNORMAL LOW (ref 0.30–0.70)
Heparin Unfractionated: 0.32 IU/mL (ref 0.30–0.70)

## 2021-03-23 LAB — ABO/RH: ABO/RH(D): B POS

## 2021-03-23 SURGERY — LEFT HEART CATH AND CORONARY ANGIOGRAPHY
Anesthesia: LOCAL

## 2021-03-23 MED ORDER — EPINEPHRINE HCL 5 MG/250ML IV SOLN IN NS
0.0000 ug/min | INTRAVENOUS | Status: DC
  Filled 2021-03-23 (×2): qty 250

## 2021-03-23 MED ORDER — BISACODYL 5 MG PO TBEC
5.0000 mg | DELAYED_RELEASE_TABLET | Freq: Once | ORAL | Status: AC
  Administered 2021-03-23: 5 mg via ORAL
  Filled 2021-03-23: qty 1

## 2021-03-23 MED ORDER — METOPROLOL TARTRATE 12.5 MG HALF TABLET
12.5000 mg | ORAL_TABLET | Freq: Once | ORAL | Status: AC
  Administered 2021-03-24: 12.5 mg via ORAL
  Filled 2021-03-23: qty 1

## 2021-03-23 MED ORDER — PLASMA-LYTE A IV SOLN
INTRAVENOUS | Status: DC
  Filled 2021-03-23 (×2): qty 5

## 2021-03-23 MED ORDER — SODIUM CHLORIDE 0.9 % IV SOLN
INTRAVENOUS | Status: DC
  Filled 2021-03-23 (×2): qty 30

## 2021-03-23 MED ORDER — TRANEXAMIC ACID 1000 MG/10ML IV SOLN
1.5000 mg/kg/h | INTRAVENOUS | Status: AC
  Administered 2021-03-24: 1.5 mg/kg/h via INTRAVENOUS
  Filled 2021-03-23 (×2): qty 25

## 2021-03-23 MED ORDER — TRANEXAMIC ACID (OHS) PUMP PRIME SOLUTION
2.0000 mg/kg | INTRAVENOUS | Status: DC
  Filled 2021-03-23 (×2): qty 1.24

## 2021-03-23 MED ORDER — NITROGLYCERIN IN D5W 200-5 MCG/ML-% IV SOLN
2.0000 ug/min | INTRAVENOUS | Status: DC
  Filled 2021-03-23 (×2): qty 250

## 2021-03-23 MED ORDER — MILRINONE LACTATE IN DEXTROSE 20-5 MG/100ML-% IV SOLN
0.3000 ug/kg/min | INTRAVENOUS | Status: DC
  Filled 2021-03-23 (×2): qty 100

## 2021-03-23 MED ORDER — POTASSIUM CHLORIDE 2 MEQ/ML IV SOLN
80.0000 meq | INTRAVENOUS | Status: DC
  Filled 2021-03-23 (×3): qty 40

## 2021-03-23 MED ORDER — TRANEXAMIC ACID (OHS) BOLUS VIA INFUSION
15.0000 mg/kg | INTRAVENOUS | Status: AC
  Administered 2021-03-24: 927 mg via INTRAVENOUS
  Filled 2021-03-23: qty 927

## 2021-03-23 MED ORDER — CEFAZOLIN SODIUM-DEXTROSE 2-4 GM/100ML-% IV SOLN
2.0000 g | INTRAVENOUS | Status: AC
  Administered 2021-03-24: 2 g via INTRAVENOUS
  Filled 2021-03-23 (×2): qty 100

## 2021-03-23 MED ORDER — NOREPINEPHRINE 4 MG/250ML-% IV SOLN
0.0000 ug/min | INTRAVENOUS | Status: DC
  Filled 2021-03-23 (×2): qty 250

## 2021-03-23 MED ORDER — CHLORHEXIDINE GLUCONATE 0.12 % MT SOLN
15.0000 mL | Freq: Once | OROMUCOSAL | Status: AC
  Administered 2021-03-24: 15 mL via OROMUCOSAL
  Filled 2021-03-23: qty 15

## 2021-03-23 MED ORDER — TEMAZEPAM 15 MG PO CAPS
15.0000 mg | ORAL_CAPSULE | Freq: Once | ORAL | Status: AC | PRN
  Administered 2021-03-23: 15 mg via ORAL
  Filled 2021-03-23: qty 1

## 2021-03-23 MED ORDER — DEXMEDETOMIDINE HCL IN NACL 400 MCG/100ML IV SOLN
0.1000 ug/kg/h | INTRAVENOUS | Status: AC
  Administered 2021-03-24: .5 ug/kg/h via INTRAVENOUS
  Filled 2021-03-23 (×2): qty 100

## 2021-03-23 MED ORDER — CEFAZOLIN SODIUM-DEXTROSE 2-4 GM/100ML-% IV SOLN
2.0000 g | INTRAVENOUS | Status: DC
  Filled 2021-03-23 (×2): qty 100

## 2021-03-23 MED ORDER — METOPROLOL SUCCINATE ER 25 MG PO TB24
25.0000 mg | ORAL_TABLET | Freq: Every day | ORAL | Status: DC
  Administered 2021-03-23: 25 mg via ORAL
  Filled 2021-03-23: qty 1

## 2021-03-23 MED ORDER — VANCOMYCIN HCL 1250 MG/250ML IV SOLN
1250.0000 mg | INTRAVENOUS | Status: AC
  Administered 2021-03-24: 1250 mg via INTRAVENOUS
  Filled 2021-03-23 (×2): qty 250

## 2021-03-23 MED ORDER — MAGNESIUM SULFATE 50 % IJ SOLN
40.0000 meq | INTRAMUSCULAR | Status: DC
  Filled 2021-03-23: qty 9.85

## 2021-03-23 MED ORDER — INSULIN REGULAR(HUMAN) IN NACL 100-0.9 UT/100ML-% IV SOLN
INTRAVENOUS | Status: DC
  Filled 2021-03-23 (×2): qty 100

## 2021-03-23 MED ORDER — CHLORHEXIDINE GLUCONATE CLOTH 2 % EX PADS
6.0000 | MEDICATED_PAD | Freq: Once | CUTANEOUS | Status: AC
  Administered 2021-03-23: 6 via TOPICAL

## 2021-03-23 MED ORDER — PHENYLEPHRINE HCL-NACL 20-0.9 MG/250ML-% IV SOLN
30.0000 ug/min | INTRAVENOUS | Status: DC
  Filled 2021-03-23 (×2): qty 250

## 2021-03-23 MED ORDER — CHLORHEXIDINE GLUCONATE CLOTH 2 % EX PADS
6.0000 | MEDICATED_PAD | Freq: Once | CUTANEOUS | Status: AC
  Administered 2021-03-24: 6 via TOPICAL

## 2021-03-23 NOTE — Progress Notes (Signed)
Pre-CABG study completed.  ° °Please see CV Proc for preliminary results.  ° °Jodean Valade, RDMS, RVT ° °

## 2021-03-23 NOTE — Progress Notes (Signed)
TCTS consulted for CABG evaluation. °

## 2021-03-23 NOTE — Progress Notes (Signed)
ANTICOAGULATION CONSULT NOTE  Pharmacy Consult for IV Heparin Indication: chest pain/ACS  Allergies  Allergen Reactions   Macrobid [Nitrofurantoin] Other (See Comments)    Cold chills, muscle weakness.   Mold Extract [Trichophyton] Other (See Comments)    Allergies.    Statins Other (See Comments)   Other Other (See Comments)    Allergies.   Mold in Cheese - stuffy nose and headache  Chocolate- stuffy nose and headache  Mildew - stuffy nose and headache    Patient Measurements: Height: 5\' 3"  (160 cm) Weight: 61.8 kg (136 lb 3.9 oz) IBW/kg (Calculated) : 52.4 Heparin Dosing Weight:  64.7 kg  Vital Signs: Temp: 98 F (36.7 C) (07/07 2332) Temp Source: Oral (07/07 2332) BP: 151/75 (07/07 2332) Pulse Rate: 91 (07/07 2332)  Labs: Recent Labs    03/21/21 1006 03/21/21 1221 03/22/21 0149 03/22/21 1652 03/22/21 1811 03/23/21 0031 03/23/21 1317 03/23/21 2312  HGB 13.2  --  12.9  --   --  12.2  --   --   HCT 39.9  --  38.8  --   --  37.2  --   --   PLT 402*  --  374  --   --  372  --   --   HEPARINUNFRC  --   --   --   --   --   --  <0.10* 0.32  CREATININE 0.93  --  0.86  --   --  0.89  --   --   TROPONINIHS 87* 90*  --  3,248* 3,895*  --   --   --      Estimated Creatinine Clearance: 41 mL/min (by C-G formula based on SCr of 0.89 mg/dL).   Medical History: Past Medical History:  Diagnosis Date   Arthritis    Chronic kidney disease, stage 3 (HCC)    Headache    Hypercholesteremia    Hyperlipidemia    Osteopenia    Personal history of colonic polyps     Assessment: 80 yr old woman with no known CAD was admitted on 03/21/21 with CP, troponins 87>90. Pt is S/P myoview today which showed large area of reversible ischemia involving most of the septum, distal segments of all walls and the left ventricular apex. Pharmacy is consulted for heparin dosing for ACS. Pt was no on anticoagulant PTA. Pt was ordered Lovenox for VTE prophylaxis, but has not rec'd any doses  during this admission.  Heparin level therapeutic (0.32) on gtt at 1050 units/hr. Plan for CABG 7/8.   Goal of Therapy:  Heparin level 0.3-0.7 units/ml Monitor platelets by anticoagulation protocol: Yes   Plan:  Continue heparin at 1050 units/h F/u daily heparin level  9/8, PharmD, BCPS Please see amion for complete clinical pharmacist phone list 03/23/2021

## 2021-03-23 NOTE — Consult Note (Signed)
[address]       [place] [address]             [phone]        Kimberly Calhoun [facility] Medical Record #[id number] Date of Birth: 01-13-1941  Referring: Lennette Bihari, MD Primary Care: Ileana Ladd, MD Primary Cardiologist:Jayadeep Eldridge Dace, MD  Chief Complaint:   Chest pain   History of Present Illness:     [name] is an 80 year old female with history of dyslipidemia and chronic stage III kidney disease.  She has no prior cardiac history.  She presented to the [facility] ED on 03/21/2021 after an episode of chest discomfort radiating to her arms that lasted about 5 minutes.  She had been having similar episodes postprandially lasting less than a minute for a few months.  She was evaluated by cardiology in May of this year with a similar complaint of chest discomfort and shortness of breath.  The chest pain was felt to be originating from her chest wall and the shortness of breath was thought to possibly be of allergic reaction triggered by her cat. In the emergency room, EKG showed nonspecific ST-T wave changes.  Troponin was 87 and later rose to 90.  She was noted to be hypertensive in the emergency room although she had no prior history of hypertension.  She was admitted to the hospital and started on aspirin along with low-dose amlodipine.  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  The left ventricular ejection fraction was estimated at 60 to 65%.  There was no significant mitral insufficiency, no evidence of mitral stenosis, and no valve insufficiency.  On the day after admission, she had a Myoview nuclear stress test that showed a large area of ischemia.  Following the Myoview, she had chest pain once again and high-sensitivity troponin was again checked and had risen to above 3800.  She was started on heparin and nitroglycerin infusions.  The cardiology team proceeded with  left heart catheterization in the evening on 03/22/2021.  This demonstrated a proximal 90% stenosis in the LAD before the first diagonal and a long area of 99% stenosis in the LAD after the second diagonal.  Additionally, there was an 80% mid stenosis in the RCA and a proximal 80% stenosis in the posterior descending coronary artery. CT surgery has been asked to evaluate [name] for consideration of coronary bypass grafting for multivessel coronary disease. Currently [name] is resting in bed after having just completed  carotid duplex and lower extremity arterial studies.  He has heparin and nitroglycerin infusing.  She denies any chest pain. She considers herself fairly healthy and active.  She enjoys gardening.  She just lost her husband in 2020 and lives by herself currently.  She does have several family members who live close by.         Current Activity/ Functional Status: Patient is independent with mobility/ambulation, transfers, ADL's, IADL's.   Zubrod Score: At the time of surgery this patient's most appropriate activity status/level should be described as:     0    Normal activity, no symptoms     1    Restricted in physical strenuous activity but ambulatory, able to do out light work     2    Ambulatory and capable of self care, unable to do work activities, up and about  more than 50%  Of the time                            []     3    Only limited self care, in bed greater than 50% of waking hours []     4    Completely disabled, no self care, confined to bed or chair []     5    Moribund  Past Medical History:  Diagnosis Date   Arthritis    Chronic kidney disease, stage 3 (HCC)    Headache    Hypercholesteremia    Hyperlipidemia    Osteopenia    Personal history of colonic polyps     Past Surgical History:  Procedure Laterality Date   ABDOMINAL HYSTERECTOMY  02/2013   COLONOSCOPY WITH PROPOFOL N/A 12/07/2014   Procedure: COLONOSCOPY WITH  PROPOFOL;  Surgeon: , MD;  Location: [facility];  Service: Endoscopy;  Laterality: N/A;  ultraslim   LEFT HEART CATH AND CORONARY ANGIOGRAPHY N/A 03/22/2021   Procedure: LEFT HEART CATH AND CORONARY ANGIOGRAPHY;  Surgeon: 12/09/2014, MD;  Location: [facility] INVASIVE CV [facility];  Service: Cardiovascular;  Laterality: N/A;    Social History   Tobacco Use  Smoking Status Never  Smokeless Tobacco Never    Social History   Substance and Sexual Activity  Alcohol Use No     Allergies  Allergen Reactions   Macrobid [Nitrofurantoin] Other (See Comments)    Cold chills, muscle weakness.   Mold Extract [Trichophyton] Other (See Comments)    Allergies.    Statins Other (See Comments)   Other Other (See Comments)    Allergies.   Mold in Cheese - stuffy nose and headache  Chocolate- stuffy nose and headache  Mildew - stuffy nose and headache    Current Facility-Administered Medications  Medication Dose Route Frequency Provider Last Rate Last Admin   0.9 %  sodium chloride infusion  250 mL Intravenous PRN Hilty, Bernette Redbird, MD       0.9 %  sodium chloride infusion   Intravenous Continuous 05/23/2021, MD 100 mL/hr at 03/23/21 0330 Rate Verify at 03/23/21 0330   0.9 %  sodium chloride infusion  250 mL Intravenous PRN Lennette Bihari, MD       acetaminophen (TYLENOL) tablet 650 mg  650 mg Oral Q4H PRN 05/24/21 M., PA-C   650 mg at 03/23/21 0045   alum & mag hydroxide-simeth (MAALOX/MYLANTA) 200-200-20 MG/5ML suspension 30 mL  30 mL Oral Q6H PRN Barrett, Rhonda G, PA-C   30 mL at 03/22/21 1228   amLODipine (NORVASC) tablet 5 mg  5 mg Oral Daily 05/24/21, MD   5 mg at 03/23/21 0919   aspirin chewable tablet 81 mg  81 mg Oral Daily 05/23/21, MD   81 mg at 03/23/21 0919   ezetimibe (ZETIA) tablet 10 mg  10 mg Oral Daily 05/24/21, MD   10 mg at 03/23/21 0919   heparin ADULT infusion 100 units/mL (25000 units/248mL)  1,050 Units/hr Intravenous  Continuous Lennette Bihari, RPH 8 mL/hr at 03/23/21 0513 800 Units/hr at 03/23/21 0513   loratadine (CLARITIN) tablet 10 mg  10 mg Oral Daily Mosetta Anis M., PA-C   10 mg at 03/23/21 0919   metoprolol succinate (TOPROL-XL) 24 hr tablet 25 mg  25 mg Oral Daily Judy Pimple, MD   25 mg at 03/23/21 [phone]  nitroGLYCERIN (NITROSTAT) SL tablet 0.4 mg  0.4 mg Sublingual Q5 Min x 3 PRN Kroeger, Krista M., PA-C       nitroGLYCERIN (NITROSTAT) SL tablet 0.4 mg  0.4 mg Sublingual Q5 min PRN Barrett, Joline Salt, PA-C   0.4 mg at 03/22/21 1243   nitroGLYCERIN 50 mg in dextrose 5 % 250 mL (0.2 mg/mL) infusion  0-200 mcg/min Intravenous Titrated Barrett, Rhonda G, PA-C   Stopped at 03/23/21 0430   ondansetron (ZOFRAN) injection 4 mg  4 mg Intravenous Q6H PRN Riley Nearing, Krista M., PA-C       sodium chloride flush (NS) 0.9 % injection 3 mL  3 mL Intravenous Q12H Hilty, Lisette Abu, MD   3 mL at 03/22/21 2215   sodium chloride flush (NS) 0.9 % injection 3 mL  3 mL Intravenous PRN Chrystie Nose, MD       sodium chloride flush (NS) 0.9 % injection 3 mL  3 mL Intravenous Q12H Lennette Bihari, MD   3 mL at 03/23/21 0921   sodium chloride flush (NS) 0.9 % injection 3 mL  3 mL Intravenous PRN Lennette Bihari, MD        Medications Prior to Admission  Medication Sig Dispense Refill Last Dose   albuterol (VENTOLIN HFA) 108 (90 Base) MCG/ACT inhaler 1-2 puffs as needed   03/20/2021   albuterol (VENTOLIN HFA) 108 (90 Base) MCG/ACT inhaler SMARTSIG:1-2 Puff(s) By Mouth Every 4 Hours PRN   03/20/2021   aspirin EC 81 MG tablet Take 81 mg by mouth every morning.   03/20/2021   CALCIUM PO Take 1 tablet by mouth at bedtime as needed.   03/20/2021   cetirizine (ZYRTEC) 10 MG tablet Take 10 mg by mouth daily.   03/20/2021   Cholecalciferol (VITAMIN D PO) Take 1 tablet by mouth every morning.   03/20/2021   Cyanocobalamin 5000 MCG SUBL Place under the tongue.   03/20/2021   Multiple Vitamin (MULTIVITAMIN WITH MINERALS) TABS tablet  Take 1 tablet by mouth at bedtime. Centrum silver.   03/20/2021   Propylene Glycol (SYSTANE BALANCE OP) Apply 1 drop to eye daily as needed (dry eyes.).   03/20/2021   vitamin C (ASCORBIC ACID) 500 MG tablet Take 500 mg by mouth every morning.   03/20/2021   vitamin E 400 UNIT capsule Take 400 Units by mouth every morning.   03/20/2021   zinc gluconate 50 MG tablet Take 50 mg by mouth daily.   03/20/2021   acetaminophen (TYLENOL) 500 MG tablet Take 500 mg by mouth every 6 (six) hours as needed for moderate pain or headache.      ibuprofen (ADVIL,MOTRIN) 200 MG tablet Take 200 mg by mouth every 6 (six) hours as needed for headache or moderate pain.       Family History  Problem Relation Age of Onset   Heart disease Father    Diabetes Brother    Breast cancer Neg Hx      Review of Systems:   ROS    Cardiac Review of Systems: Y or  [    ]= no  Chest Pain [  x  ]  Resting SOB [   ] Exertional SOB  [ x ]  Orthopnea [  ]   Pedal Edema [   ]    Palpitations [  ] Syncope  [  ]   Presyncope [   ]  General Review of Systems: [Y] = yes [  ]=no Constitional: recent weight change [  ];  anorexia [  ]; fatigue [  ]; nausea [  ]; night sweats [  ]; fever [  ]; or chills [  ]                                                               Dental: Last Dentist visit: within 6 months  Eye : blurred vision [  ]; diplopia [   ]; vision changes [  ];  Amaurosis fugax[  ]; Resp: cough [  ];  wheezing[  ];  hemoptysis[  ]; shortness of breath[ x ]; paroxysmal nocturnal dyspnea[  ]; dyspnea on exertion[  ]; or orthopnea[  ];  GI:  gallstones[  ], vomiting[  ];  dysphagia[  ]; melena[  ];  hematochezia [  ]; heartburn[  ];   Hx of  Colonoscopy[  ]; GU: kidney stones [  ]; hematuria[  ];   dysuria [  ];  nocturia[  ];  history of     obstruction [  ]; urinary frequency [  ]             Skin: rash, swelling[  ];, hair loss[  ];  peripheral edema[  ];  or itching[  ]; Musculosketetal: myalgias[  ];  joint swelling[  ];   joint erythema[  ];  joint pain[  ];  back pain[  ];  Heme/Lymph: bruising[  ];  bleeding[  ];  anemia[  ];  Neuro: TIA[  ];  headaches[  ];  stroke[  ];  vertigo[  ];  seizures[  ];   paresthesias[  ];  difficulty walking[  ];  Psych:depression[  ]; anxiety[  ];  Endocrine: diabetes[  ];  thyroid dysfunction[  ];                  Physical Exam: BP 124/72 (BP Location: Left Arm)   Pulse (!) 105   Temp 97.9 F (36.6 C) (Oral)   Resp 20   Ht  (1.6 m)   Wt 61.8 kg   SpO2 97%   BMI 24.13 kg/m    General appearance: alert, cooperative, and no distress Head: Normocephalic, without obvious abnormality, atraumatic Neck: no adenopathy, no carotid bruit, no JVD, supple, symmetrical, trachea midline, and thyroid not enlarged, symmetric, no tenderness/mass/nodules Resp: clear to auscultation bilaterally Cardio: Sinus tachycardia with a rate about 110.  She has a soft systolic murmur along left sternal border. GI: soft, non-tender; bowel sounds normal; no masses,  no organomegaly Extremities: No deformities.  All extremities are well perfused.  There are palpable pulses throughout. Neurologic: Grossly normal  Diagnostic Studies & Laboratory data:  Left heart catheterization 03/22/21: Mid LM to Dist LM lesion is 40% stenosed. Prox LAD lesion is 90% stenosed. Mid LAD to Dist LAD lesion is 99% stenosed. Prox RCA lesion is 50% stenosed. Dist RCA lesion is 30% stenosed. Prox RCA to Mid RCA lesion is 80% stenosed. RPDA lesion is 80% stenosed. Dist LAD-1 lesion is 99% stenosed. Dist LAD-2 lesion is 90% stenosed. Prox LAD to Mid LAD lesion is 40% stenosed with 40% stenosed side branch in 1st Diag. The left ventricular ejection fraction is greater than 65% by visual estimate.   Severe multivessel CAD with significant calcification in the proximal LAD with  40% smooth proximal stenosis followed by 90% stenosis with irregularity in the region of the first diagonal takeoff.  The LAD is  subtotally occluded after a very tortuous large second diagonal vessel.   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   Large dominant RCA with 30% proximal stenosis followed by 50 and 80% eccentric mid stenoses with 30% stenosis before the PDA takeoff and 80% stenosis in the PDA.  The distal RCA supplies 4 large branches which extend to the LV apex.   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   RECOMMENDATION: Angiograms will be reviewed with colleagues.  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  The Myoview study suggests significant viability to the LAD territory.  Consider surgical consultation for CABG revascularization. Diagnostic Dominance: Right        Recent Radiology Findings:     NM Myocar Multi W/Spect W/Wall Motion / EF  Result Date: 03/22/2021 CLINICAL DATA:  Chest pain. History of macular degeneration and chronic kidney disease, stage III A. EXAM: MYOCARDIAL IMAGING WITH SPECT (REST AND PHARMACOLOGIC-STRESS) GATED LEFT VENTRICULAR WALL MOTION STUDY LEFT VENTRICULAR EJECTION FRACTION TECHNIQUE: Standard myocardial SPECT imaging was performed after resting intravenous injection of 10 mCi Tc-39m tetrofosmin. Subsequently, intravenous infusion of Lexiscan was performed under the supervision of the Cardiology staff. At peak effect of the drug, 32.3 mCi Tc-51m tetrofosmin was injected intravenously and standard myocardial SPECT imaging was performed. Quantitative gated imaging was also performed to evaluate left ventricular wall motion, and estimate left ventricular ejection fraction. COMPARISON:  Chest radiograph 03/21/2021. FINDINGS: Perfusion: Study is mildly degraded by prominent  subdiaphragmatic activity. There is a large reversible perfusion defect involving the left ventricular apex, distal segments of all walls and the mid septum. There is incomplete reversibility in the distal septum. Summed difference score is 17. Wall Motion: Diffusely abnormal wall motion with apical hypokinesis and paradoxical motion in the septum. Left Ventricular Ejection Fraction: 59 % End diastolic volume 57 ml End systolic volume 24 ml IMPRESSION: 1. Large area of reversible ischemia involving most of the septum, distal segments of all walls and the left ventricular apex as described. 2. Abnormal left ventricular wall motion with apical hypokinesis and paradoxical motion in the septum. 3. Left ventricular ejection fraction 59% 4. Non invasive risk stratification*: High *2012 Appropriate Use Criteria for Coronary Revascularization Focused Update: J Am Coll Cardiol. 2012;59(9):857-881. http://content.dementiazones.com.aspx?articleid=1201161 These results were discussed by telephone at the time of interpretation on 03/22/2021 at 3:07 pm with provider Theodore Demark, PA-C, who verbally acknowledged these results. Electronically Signed   By: Carey Bullocks M.D.   On: 03/22/2021 15:12    ECHOCARDIOGRAM REPORT      Patient Name:   Kimberly Calhoun Date of Exam: 03/22/2021  Medical Rec #:  [id number]      Height:       63.0 in  Accession #:    [id number]     Weight:       142.6 lb  Date of Birth:  07-Jul-1941      BSA:          1.675 m  Patient Age:    79 years       BP:           188/85  mmHg  Patient Gender: F              HR:           105 bpm.  Exam Location:  Inpatient   Procedure: 2D Echo   Indications:    chest pain     History:        Patient has no prior history of Echocardiogram  examinations.     Sonographer:    Delcie Roch  Referring Phys: [id number] Beatriz Stallion      Sonographer Comments: Image acquisition challenging due to respiratory  motion.  IMPRESSIONS     1. LVOT  gradient 43 mmHg with Valsalva. Left ventricular ejection  fraction, by estimation, is 60 to 65%. The left ventricle has normal  function. The left ventricle demonstrates regional wall motion  abnormalities (see scoring diagram/findings for  description). There is moderate asymmetric left ventricular hypertrophy of  the basal-septal segment. Left ventricular diastolic parameters are  indeterminate. There is hypokinesis of the left ventricular, basal-mid  anteroseptal wall and inferoseptal wall.   2. Right ventricular systolic function is normal. The right ventricular  size is normal.   3. The mitral valve is degenerative. Mild mitral valve regurgitation. No  evidence of mitral stenosis.   4. The aortic valve was not well visualized. Aortic valve regurgitation  is not visualized.   5. The inferior vena cava is normal in size with greater than 50%  respiratory variability, suggesting right atrial pressure of 3 mmHg.   Comparison(s): No prior Echocardiogram.   Conclusion(s)/Recommendation(s): Focal hypokinesis at basal septum with  preserved EF.   FINDINGS   Left Ventricle: LVOT gradient 43 mmHg with Valsalva. Left ventricular  ejection fraction, by estimation, is 60 to 65%. The left ventricle has  normal function. The left ventricle demonstrates regional wall motion  abnormalities. The left ventricular  internal cavity size was normal in size. There is moderate asymmetric left  ventricular hypertrophy of the basal-septal segment. Left ventricular  diastolic parameters are indeterminate.   Right Ventricle: The right ventricular size is normal. No increase in  right ventricular wall thickness. Right ventricular systolic function is  normal.   Left Atrium: Left atrial size was normal in size.   Right Atrium: Right atrial size was normal in size.   Pericardium: There is no evidence of pericardial effusion. Presence of  pericardial fat pad.   Mitral Valve: The mitral valve is  degenerative in appearance. There is  moderate thickening of the mitral valve leaflet(s). There is moderate  calcification of the mitral valve leaflet(s). Mild to moderate mitral  annular calcification. Mild mitral valve  regurgitation. No evidence of mitral valve stenosis.   Tricuspid Valve: The tricuspid valve is grossly normal. Tricuspid valve  regurgitation is trivial. No evidence of tricuspid stenosis.   Aortic Valve: The aortic valve was not well visualized. Aortic valve  regurgitation is not visualized.   Pulmonic Valve: The pulmonic valve was not well visualized. Pulmonic valve  regurgitation is not visualized.   Aorta: The aortic root, ascending aorta, aortic arch and descending aorta  are all structurally normal, with no evidence of dilitation or  obstruction.   Venous: The inferior vena cava is normal in size with greater than 50%  respiratory variability, suggesting right atrial pressure of 3 mmHg.   IAS/Shunts: The interatrial septum was not well visualized.      LEFT VENTRICLE  PLAX 2D  LVIDd:         3.90 cm  Diastology  LVIDs:         2.40 cm  LV e' medial:    6.96 cm/s  LV PW:         0.80 cm  LV E/e' medial:  10.6  LV IVS:        0.80 cm  LV e' lateral:   7.72 cm/s  LVOT diam:     1.80 cm  LV E/e' lateral: 9.5  LVOT Area:     2.54 cm      RIGHT VENTRICLE  RV S prime:     16.80 cm/s  TAPSE (M-mode): 1.7 cm   LEFT ATRIUM             Index       RIGHT ATRIUM          Index  LA diam:        3.10 cm 1.85 cm/m  RA Area:     8.14 cm  LA Vol (A2C):   26.6 ml 15.88 ml/m RA Volume:   14.70 ml 8.78 ml/m  LA Vol (A4C):   27.4 ml 16.36 ml/m  LA Biplane Vol: 28.7 ml 17.14 ml/m      AORTA  Ao Root diam: 3.00 cm  Ao Asc diam:  2.70 cm   MV E velocity: 73.70 cm/s  MV A velocity: 147.00 cm/s  SHUNTS  MV E/A ratio:  0.50         Systemic Diam: 1.80 cm   Jodelle RedBridgette Christopher MD  Electronically signed by Jodelle RedBridgette Christopher MD  Signature Date/Time:  03/22/2021/6:38:34 PM    I have independently reviewed the above radiologic studies and discussed with the patient   Recent Lab Findings: Lab Results  Component Value Date   WBC 11.5 (H) 03/23/2021   HGB 12.2 03/23/2021   HCT 37.2 03/23/2021   PLT 372 03/23/2021   GLUCOSE 120 (H) 03/23/2021   CHOL 270 (H) 03/22/2021   TRIG 140 03/22/2021   HDL 62 03/22/2021   LDLCALC 180 (H) 03/22/2021   NA 131 (L) 03/23/2021   K 4.2 03/23/2021   CL 100 03/23/2021   CREATININE 0.89 03/23/2021   BUN 18 03/23/2021   CO2 25 03/23/2021   HGBA1C 5.8 (H) 03/22/2021      Assessment / Plan:    -Very pleasant 80 year old female with vague postprandial chest discomfort over the past few months that is recently progressed in intensity and duration.  This prompted visit to the emergency room and hospital admission for further work-up.  After a Myoview nuclear stress test yesterday, she had an episode of more intense chest pain and concurrent elevation in high-sensitivity troponin to greater than 3800.  The cardiology team elected to proceed with left heart catheterization last evening.  She has severe two-vessel coronary artery disease with preserved LV function.  She does have significant left ventricular hypertrophy with an LV outflow tract gradient of 43 mmHg.  Despite her advanced age, she remains very active with gardening and caring for her home. The general process of coronary bypass grafting and the expected postoperative course was discussed with [name] and her questions were answered.  She would like for us to proceed with the necessary work-up and arrangements for coronary bypass grafting.  She is tentatively scheduled for surgery on Monday, 03/27/2021.    -History of stage III chronic kidney disease is mentioned in her admission notes-her creatinine is currently 0.89  -Hypertension-no prior history of hypertension but blood pressure was elevated on admission.  She  has been started on Norvasc with  appropriate correction.   I  spent 20 minutes counseling the patient face to face.   Leary Roca, PA-C  03/23/2021 3:14 PM

## 2021-03-23 NOTE — Progress Notes (Addendum)
Progress Note  Patient Name: Kimberly Calhoun Date of Encounter: 03/23/2021  [facility] Cardiologist: Lance Muss, MD   Subjective   Feels better this morning. Currently chest pain free. No SOB. Asking when she can leave the hospital.  Underwent myoview yesterday which demonstrated a large reversible perfusion defect in the septal and distal segments of all walls and apex  Following myoview, the patient was placed on heparin gtt and nitro gtt. 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   Inpatient Medications    Scheduled Meds:  amLODipine  5 mg Oral Daily   aspirin  81 mg Oral Daily   ezetimibe  10 mg Oral Daily   loratadine  10 mg Oral Daily   sodium chloride flush  3 mL Intravenous Q12H   sodium chloride flush  3 mL Intravenous Q12H   Continuous Infusions:  sodium chloride     sodium chloride 100 mL/hr at 03/23/21 0330   sodium chloride     heparin 800 Units/hr (03/23/21 0513)   nitroGLYCERIN Stopped (03/23/21 0430)   PRN Meds: sodium chloride, sodium chloride, acetaminophen, alum & mag hydroxide-simeth, nitroGLYCERIN, nitroGLYCERIN, ondansetron (ZOFRAN) IV, sodium chloride flush, sodium chloride flush   Vital Signs    Vitals:   03/22/21 2230 03/22/21 2308 03/23/21 0300 03/23/21 0740  BP: (!) 109/59 (!) 110/57 117/67 124/72  Pulse: 88 89 90 (!) 112  Resp: 20 17 14 20   Temp:  98 F (36.7 C) 98.1 F (36.7 C) 97.9 F (36.6 C)  TempSrc:  Oral Oral Oral  SpO2:  95% 98% 97%  Weight:   61.8 kg   Height:        Intake/Output Summary (Last 24 hours) at 03/23/2021 0741 Last data filed at 03/23/2021 0600 Gross per 24 hour  Intake 698.61 ml  Output --  Net 698.61 ml    Last 3 Weights  03/23/2021 03/22/2021 03/21/2021  Weight (lbs) 136 lb 3.9 oz 142 lb 10.2 oz 139 lb 5.3 oz  Weight (kg) 61.8 kg 64.7 kg 63.2 kg      Telemetry    NSR/sinus tachycardia- Personally Reviewed  ECG    NSR with HR 95; no STE/depressions - Personally Reviewed  Physical Exam   GEN: No acute distress.   Neck: No JVD Cardiac: RRR, soft systolic murmur. No rubs or gallops.  Respiratory: Clear to auscultation bilaterally. GI: Soft, nontender, non-distended  MS: No edema; No deformity. Neuro:  Nonfocal  Psych: Normal affect   Labs    High Sensitivity Troponin:   Recent Labs  Lab 03/21/21 1006 03/21/21 1221 03/22/21 1652 03/22/21 1811  TROPONINIHS 87* 90* 3,248* 3,895*       Chemistry Recent Labs  Lab 03/21/21 1006 03/22/21 0149 03/23/21 0031  NA 138 137 131*  K 3.8 4.4 4.2  CL 102 103 100  CO2 26 27 25   GLUCOSE 95 92 120*  BUN 21 14 18   CREATININE 0.93 0.86 0.89  CALCIUM 9.7 9.4 8.7*  GFRNONAA >60 >60 >60  ANIONGAP 10 7 6       Hematology Recent Labs  Lab 03/21/21 1006 03/22/21 0149 03/23/21 0031  WBC 16.1* 10.6* 11.5*  RBC 4.49 4.31 4.17  HGB 13.2 12.9 12.2  HCT 39.9 38.8 37.2  MCV 88.9 90.0 89.2  MCH 29.4 29.9 29.3  MCHC 33.1 33.2 32.8  RDW 14.3 14.1 14.0  PLT 402* 374 372     BNP Recent Labs  Lab 03/21/21 1006  BNP 252.7*      DDimer No results for input(s): DDIMER in the last 168 hours.   Radiology    DG Chest 2 View  Result Date: 03/21/2021 CLINICAL DATA:  80 year old female with history of chest pain and shortness of breath. EXAM: CHEST - 2 VIEW COMPARISON:  No priors. FINDINGS: Lung volumes are normal. No consolidative airspace disease. There is some volume loss and architectural distortion in the region of the right middle lobe. No pleural effusions. No pneumothorax. No pulmonary nodule or mass noted. Pulmonary vasculature and the cardiomediastinal silhouette are within normal limits. Atherosclerotic calcifications in the thoracic aorta.  IMPRESSION: 1. Volume loss and architectural distortion in the region of the right middle lobe which could reflect areas of chronic post infectious or inflammatory scarring, however, no prior studies are available for comparison. Repeat standing PA and lateral chest radiograph is recommended in 1-2 months to ensure the stability of this finding. 2. Aortic atherosclerosis. Electronically Signed   By: Trudie Reed M.D.   On: 03/21/2021 10:33   CARDIAC CATHETERIZATION  Result Date: 03/22/2021  Mid LM to Dist LM lesion is 40% stenosed.  Prox LAD lesion is 90% stenosed.  Mid LAD to Dist LAD lesion is 99% stenosed.  Prox RCA lesion is 50% stenosed.  Dist RCA lesion is 30% stenosed.  Prox RCA to Mid RCA lesion is 80% stenosed.  RPDA lesion is 80% stenosed.  Dist LAD-1 lesion is 99% stenosed.  Dist LAD-2 lesion is 90% stenosed.  Prox LAD to Mid LAD lesion is 40% stenosed with 40% stenosed side branch in 1st Diag.  The left ventricular ejection fraction is greater than 65% by visual estimate.  Severe multivessel CAD with significant calcification in the proximal LAD with 40% smooth proximal stenosis followed by 90% stenosis with irregularity in the region of the first diagonal takeoff.  The LAD is subtotally occluded after a very tortuous large second diagonal vessel. 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 Large dominant RCA with 30% proximal stenosis followed by 50 and 80% eccentric mid stenoses with 30% stenosis before the PDA takeoff and 80% stenosis in the PDA.  The distal RCA supplies 4 large branches which extend to the LV apex. 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 RECOMMENDATION: Angiograms will be reviewed with colleagues.  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  The Myoview study suggests significant viability to the LAD territory.  Consider surgical consultation for CABG revascularization.   NM Myocar Multi W/Spect W/Wall Motion / EF  Result Date: 03/22/2021 CLINICAL DATA:  Chest pain. History of macular degeneration and chronic kidney disease, stage III A. EXAM: MYOCARDIAL IMAGING WITH SPECT (REST AND PHARMACOLOGIC-STRESS) GATED LEFT VENTRICULAR WALL MOTION STUDY LEFT VENTRICULAR EJECTION FRACTION TECHNIQUE: Standard myocardial SPECT imaging was performed after resting intravenous injection of 10 mCi Tc-31m tetrofosmin. Subsequently, intravenous infusion of Lexiscan was performed under the supervision of the Cardiology staff. At peak effect of the drug, 32.3 mCi Tc-8m tetrofosmin was injected intravenously and standard myocardial SPECT imaging was performed. Quantitative gated  imaging was also performed to evaluate left ventricular wall motion, and estimate left ventricular ejection fraction. COMPARISON:  Chest radiograph 03/21/2021. FINDINGS: Perfusion: Study is mildly degraded by prominent subdiaphragmatic activity. There is a large reversible perfusion defect involving the left ventricular apex, distal segments of all walls and the mid septum. There is incomplete reversibility in the distal septum. Summed difference score is 17. Wall Motion: Diffusely abnormal wall motion with apical hypokinesis and paradoxical motion in the septum. Left Ventricular Ejection Fraction: 59 % End diastolic volume 57 ml End systolic volume 24 ml IMPRESSION: 1. Large area of reversible ischemia involving most of the septum, distal segments of all walls and the left ventricular apex as described. 2. Abnormal left ventricular wall motion with apical hypokinesis and paradoxical motion in the septum. 3. Left ventricular ejection fraction 59% 4. Non invasive risk stratification*: High *2012 Appropriate Use  Criteria for Coronary Revascularization Focused Update: J Am Coll Cardiol. 2012;59(9):857-881. http://content.dementiazones.com.aspx?articleid=1201161 These results were discussed by telephone at the time of interpretation on 03/22/2021 at 3:07 pm with provider Theodore Demark, PA-C, who verbally acknowledged these results. Electronically Signed   By: Carey Bullocks M.D.   On: 03/22/2021 15:12   ECHOCARDIOGRAM COMPLETE  Result Date: 03/22/2021    ECHOCARDIOGRAM REPORT   Patient Name:   YURIDIANA FORMANEK Date of Exam: 03/22/2021 Medical Rec #:  [id number]      Height:       63.0 in Accession #:    [id number]     Weight:       142.6 lb Date of Birth:  February 20, 1941      BSA:          1.675 m Patient Age:    79 years       BP:           188/85 mmHg Patient Gender: F              HR:           105 bpm. Exam Location:  Inpatient Procedure: 2D Echo Indications:    chest pain  History:        Patient has no prior history of Echocardiogram examinations.  Sonographer:    Delcie Roch Referring Phys: [id number] Beatriz Stallion  Sonographer Comments: Image acquisition challenging due to respiratory motion. IMPRESSIONS  1. LVOT gradient 43 mmHg with Valsalva. Left ventricular ejection fraction, by estimation, is 60 to 65%. The left ventricle has normal function. The left ventricle demonstrates regional wall motion abnormalities (see scoring diagram/findings for description). There is moderate asymmetric left ventricular hypertrophy of the basal-septal segment. Left ventricular diastolic parameters are indeterminate. There is hypokinesis of the left ventricular, basal-mid anteroseptal wall and inferoseptal wall.  2. Right ventricular systolic function is normal. The right ventricular size is normal.  3. The mitral valve is degenerative. Mild mitral valve regurgitation. No evidence of mitral stenosis.  4. The aortic valve was not well visualized. Aortic valve regurgitation is not visualized.  5. The inferior vena cava is normal  in size with greater than 50% respiratory variability, suggesting right atrial pressure of 3 mmHg. Comparison(s): No prior Echocardiogram. Conclusion(s)/Recommendation(s): Focal hypokinesis at basal septum with preserved EF. FINDINGS  Left Ventricle: LVOT gradient 43 mmHg with Valsalva. Left ventricular ejection fraction, by estimation, is 60 to 65%. The left ventricle has normal function. The left ventricle demonstrates regional wall motion abnormalities. The left ventricular internal cavity size was normal in size. There is moderate asymmetric left ventricular hypertrophy of the  basal-septal segment. Left ventricular diastolic parameters are indeterminate. Right Ventricle: The right ventricular size is normal. No increase in right ventricular wall thickness. Right ventricular systolic function is normal. Left Atrium: Left atrial size was normal in size. Right Atrium: Right atrial size was normal in size. Pericardium: There is no evidence of pericardial effusion. Presence of pericardial fat pad. Mitral Valve: The mitral valve is degenerative in appearance. There is moderate thickening of the mitral valve leaflet(s). There is moderate calcification of the mitral valve leaflet(s). Mild to moderate mitral annular calcification. Mild mitral valve regurgitation. No evidence of mitral valve stenosis. Tricuspid Valve: The tricuspid valve is grossly normal. Tricuspid valve regurgitation is trivial. No evidence of tricuspid stenosis. Aortic Valve: The aortic valve was not well visualized. Aortic valve regurgitation is not visualized. Pulmonic Valve: The pulmonic valve was not well visualized. Pulmonic valve regurgitation is not visualized. Aorta: The aortic root, ascending aorta, aortic arch and descending aorta are all structurally normal, with no evidence of dilitation or obstruction. Venous: The inferior vena cava is normal in size with greater than 50% respiratory variability, suggesting right atrial pressure of 3 mmHg.  IAS/Shunts: The interatrial septum was not well visualized.  LEFT VENTRICLE PLAX 2D LVIDd:         3.90 cm  Diastology LVIDs:         2.40 cm  LV e' medial:    6.96 cm/s LV PW:         0.80 cm  LV E/e' medial:  10.6 LV IVS:        0.80 cm  LV e' lateral:   7.72 cm/s LVOT diam:     1.80 cm  LV E/e' lateral: 9.5 LVOT Area:     2.54 cm  RIGHT VENTRICLE RV S prime:     16.80 cm/s TAPSE (M-mode): 1.7 cm LEFT ATRIUM             Index       RIGHT ATRIUM          Index LA diam:        3.10 cm 1.85 cm/m  RA Area:     8.14 cm LA Vol (A2C):   26.6 ml 15.88 ml/m RA Volume:   14.70 ml 8.78 ml/m LA Vol (A4C):   27.4 ml 16.36 ml/m LA Biplane Vol: 28.7 ml 17.14 ml/m   AORTA Ao Root diam: 3.00 cm Ao Asc diam:  2.70 cm MV E velocity: 73.70 cm/s MV A velocity: 147.00 cm/s  SHUNTS MV E/A ratio:  0.50         Systemic Diam: 1.80 cm Jodelle RedBridgette Christopher MD Electronically signed by Jodelle RedBridgette Christopher MD Signature Date/Time: 03/22/2021/6:38:34 PM    Final     Cardiac Studies  LHC 03/22/21: Mid LM to Dist LM lesion is 40% stenosed. Prox LAD lesion is 90% stenosed. Mid LAD to Dist LAD lesion is 99% stenosed. Prox RCA lesion is 50% stenosed. Dist RCA lesion is 30% stenosed. Prox RCA to Mid RCA lesion is 80% stenosed. RPDA lesion is 80% stenosed. Dist LAD-1 lesion is 99% stenosed. Dist LAD-2 lesion is 90% stenosed. Prox LAD to Mid LAD lesion is 40% stenosed with 40% stenosed side branch in 1st Diag. The left ventricular ejection fraction is greater than 65% by visual estimate.   Severe multivessel CAD with significant calcification in the proximal LAD with 40% smooth proximal stenosis followed by 90% stenosis with irregularity in the region of the first diagonal takeoff.  The LAD is subtotally  occluded after a very tortuous large second diagonal vessel.   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   Large dominant RCA with 30% proximal stenosis followed by 50 and  80% eccentric mid stenoses with 30% stenosis before the PDA takeoff and 80% stenosis in the PDA.  The distal RCA supplies 4 large branches which extend to the LV apex.   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   RECOMMENDATION: Angiograms will be reviewed with colleagues.  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  The Myoview study suggests significant viability to the LAD territory.  Consider surgical consultation for CABG revascularization. Diagnostic Dominance: Right    Intervention      Myoview 03/22/21: IMPRESSION: 1. Large area of reversible ischemia involving most of the septum, distal segments of all walls and the left ventricular apex as described.   2. Abnormal left ventricular wall motion with apical hypokinesis and paradoxical motion in the septum.   3. Left ventricular ejection fraction 59%   4. Non invasive risk stratification*: High                                                                      TTE 03/22/21: IMPRESSIONS   1. LVOT gradient 43 mmHg with Valsalva. Left ventricular ejection  fraction, by estimation, is 60 to 65%. The left ventricle has normal  function. The left ventricle demonstrates regional wall motion  abnormalities (see scoring diagram/findings for  description). There is moderate asymmetric left ventricular hypertrophy of  the basal-septal segment. Left ventricular diastolic parameters are  indeterminate. There is hypokinesis of the left ventricular, basal-mid  anteroseptal wall and inferoseptal wall.   2. Right ventricular systolic function is normal. The right ventricular  size is normal.   3. The mitral valve is degenerative. Mild mitral valve regurgitation. No  evidence of mitral  stenosis.   4. The aortic valve was not well visualized. Aortic valve regurgitation  is not visualized.   5. The inferior vena cava is normal in size with greater than 50%  respiratory variability, suggesting right atrial pressure of 3 mmHg.   Comparison(s): No prior Echocardiogram.   Conclusion(s)/Recommendation(s): Focal hypokinesis at basal septum with  preserved EF.                        Patient Profile     80 y.o. female with history of HLD, macular degeneration, and CKD stage 3a, who presents with progressive worsening dyspnea on exertion and chest pain found to have mildly elevated BNP and torponin prompting admission to Cardiology for TTE and ischemic work-up.  Assessment & Plan    #Severe multivessel CAD: #Chest pain:  Patient presented with intermittent chest pain predominately post-prandial (lunch/dinner) lasting for 1 minutes with a more prolonged episode lasting 5 minutes 03/20/21 prompting ED evaluation. Also notes progressive dyspnea on exertion and mild chest discomfort with "a lot of exertion." EKG with non-specific ST-T wave changes. HsTrop on admission mildly elevated with flat trend. Underwent  myoview on 7/6 as detailed above found to have large reversible ischemia in the septum, all distal walls and apex. Post-myoview course complicated by refractory chest pain despite nitro gtt and rising troponin prompting LHC. Cath revealed severe multivessel CAD now awaiting CABG evaluation. 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 -Consult CT surgery (patient amenable to discuss possibility of OR with them) -Continue heparin gtt -Continue nitro gtt -Continue ASA 81mg  daily -Adamently refusing statin; will need PCSK9i as out-patient -Continue zwtia -Start metoprolol 25mg  XL daily -Will add ACE/ARB as tolerated   #Asymmetric septal thickening with possible HCM: #LVOT Gradient on TTE: Patient with significant asymmetric hypertrophy of the basal  septum resulting in LVOT peak gradient of . May be related to HTN and age related changes vs HOCM. No SAM and LVEF preserved. May be contributing to dyspnea on exertion as well as anginal symptoms as detailed above. -Start metoprolol 25mg  XL daily and up-titrate as tolerated -Maintain good hydration; not on diuretics -Manage multivessel CAD as above   #HTN: New diagnosis. Now improved with initiation of amlodipine. -Continue amlodipine 5mg  daily -Start metop 25mg  XL daily as above -Likely transition from amlodipine to ACE/ARB post-intervention    #HLD:  LDL 180, TC 270, TG 140, HDL 62. Refusing statins. Will need PCKS9i as out-patient -Intolerant to statins and patient refuses to take them -Continue zetia 10mg  daily -Will need PCSK9i as out-patient  #Abnormal CXR:  CXR showed volume loss and architectural distortion in the RML c/f chronic post infectious or inflammatory scarring. Recommended for repeat standing PA/lateral in 1-2 months. - Will need repeat CXR in 1-2 months  Long discussion held with the patient today about myoview, cath and echo findings. She is overwhelmed and concerned about the recovery with surgery, but willing to discuss with them further. She would also like to see if disease is amenable to PCI in case. We discussed that surgery is the better option for her overall and lack of revascularization is associated with high risk of recurrent MI, arrhythmias and possible death. She understands and is willing to stay for further evaluation at this time.   For questions or updates, please contact [facility] Please consult [email or web address] for contact info under        Signed, , MD  03/23/2021, 7:41 AM

## 2021-03-23 NOTE — Progress Notes (Signed)
ANTICOAGULATION CONSULT NOTE  Pharmacy Consult for IV Heparin Indication: chest pain/ACS  Allergies  Allergen Reactions   Macrobid [Nitrofurantoin] Other (See Comments)    Cold chills, muscle weakness.   Mold Extract [Trichophyton] Other (See Comments)    Allergies.    Statins Other (See Comments)   Other Other (See Comments)    Allergies.   Mold in Cheese - stuffy nose and headache  Chocolate- stuffy nose and headache  Mildew - stuffy nose and headache    Patient Measurements: Height: 5\' 3"  (160 cm) Weight: 61.8 kg (136 lb 3.9 oz) IBW/kg (Calculated) : 52.4 Heparin Dosing Weight:  64.7 kg  Vital Signs: Temp: 97.9 F (36.6 C) (07/07 0740) Temp Source: Oral (07/07 0740) BP: 124/72 (07/07 0740) Pulse Rate: 105 (07/07 0740)  Labs: Recent Labs    03/21/21 1006 03/21/21 1221 03/22/21 0149 03/22/21 1652 03/22/21 1811 03/23/21 0031 03/23/21 1317  HGB 13.2  --  12.9  --   --  12.2  --   HCT 39.9  --  38.8  --   --  37.2  --   PLT 402*  --  374  --   --  372  --   HEPARINUNFRC  --   --   --   --   --   --  <0.10*  CREATININE 0.93  --  0.86  --   --  0.89  --   TROPONINIHS 87* 90*  --  3,248* 3,895*  --   --      Estimated Creatinine Clearance: 41 mL/min (by C-G formula based on SCr of 0.89 mg/dL).   Medical History: Past Medical History:  Diagnosis Date   Arthritis    Chronic kidney disease, stage 3 (HCC)    Headache    Hypercholesteremia    Hyperlipidemia    Osteopenia    Personal history of colonic polyps     Assessment: 80 yr old woman with no known CAD was admitted on 03/21/21 with CP, troponins 87>90. Pt is S/P myoview today which showed large area of reversible ischemia involving most of the septum, distal segments of all walls and the left ventricular apex. Pharmacy is consulted for heparin dosing for ACS. Pt was no on anticoagulant PTA. Pt was ordered Lovenox for VTE prophylaxis, but has not rec'd any doses during this admission.  Initial heparin  level subtherapeutic after restart.  Goal of Therapy:  Heparin level 0.3-0.7 units/ml Monitor platelets by anticoagulation protocol: Yes   Plan:  -Increase heparin to 1050 units/h -Recheck heparin level in 8h   05/22/21, PharmD, [place], [facility] Clinical Pharmacist [phone] Please check AMION for all [facility] numbers 03/23/2021

## 2021-03-23 NOTE — Progress Notes (Addendum)
    Morning entry in regards to documented events overnight: In review of chart, notes indicated I was paged several times last evening in regards to patient condition, all which are noted in the chart to be received after 5pm at which time I was not present in the hospital or on-call for the evening. I received a call from the evening MD around 7:10 last evening to clarify on-call APP coverage. It is noted that Amion was listed incorrectly, I was actually not the APP cardiology coverage on from 5-8pm the evening of 7/6 nor was I listed as available in secure chat to receive messages. Informed the on-call APP of Amion being incorrect. Informed that they were aware of the patient condition and providing care at that time.

## 2021-03-24 ENCOUNTER — Inpatient Hospital Stay (HOSPITAL_COMMUNITY)
Admission: EM | Disposition: A | Discharge status: Home / Self Care | Payer: Self-pay | Source: Home / Self Care | Attending: Cardiothoracic Surgery

## 2021-03-24 ENCOUNTER — Inpatient Hospital Stay (HOSPITAL_COMMUNITY): Payer: Medicare Other

## 2021-03-24 ENCOUNTER — Inpatient Hospital Stay (HOSPITAL_COMMUNITY): Payer: Medicare Other | Admitting: Anesthesiology

## 2021-03-24 ENCOUNTER — Encounter (HOSPITAL_COMMUNITY): Payer: Self-pay | Admitting: Cardiology

## 2021-03-24 DIAGNOSIS — I2510 Atherosclerotic heart disease of native coronary artery without angina pectoris: Secondary | ICD-10-CM | POA: Diagnosis present

## 2021-03-24 DIAGNOSIS — I25119 Atherosclerotic heart disease of native coronary artery with unspecified angina pectoris: Secondary | ICD-10-CM

## 2021-03-24 DIAGNOSIS — Z951 Presence of aortocoronary bypass graft: Secondary | ICD-10-CM

## 2021-03-24 HISTORY — PX: TEE WITHOUT CARDIOVERSION: SHX5443

## 2021-03-24 HISTORY — PX: CORONARY ARTERY BYPASS GRAFT: SHX141

## 2021-03-24 LAB — GLUCOSE, CAPILLARY
Glucose-Capillary: 160 mg/dL — ABNORMAL HIGH (ref 70–99)
Glucose-Capillary: 143 mg/dL — ABNORMAL HIGH (ref 70–99)
Glucose-Capillary: 125 mg/dL — ABNORMAL HIGH (ref 70–99)
Glucose-Capillary: 110 mg/dL — ABNORMAL HIGH (ref 70–99)
Glucose-Capillary: 127 mg/dL — ABNORMAL HIGH (ref 70–99)
Glucose-Capillary: 181 mg/dL — ABNORMAL HIGH (ref 70–99)

## 2021-03-24 LAB — POCT I-STAT, CHEM 8
BUN: 11 mg/dL (ref 8–23)
BUN: 11 mg/dL (ref 8–23)
BUN: 11 mg/dL (ref 8–23)
BUN: 11 mg/dL (ref 8–23)
Calcium, Ion: 1.19 mmol/L (ref 1.15–1.40)
Calcium, Ion: 1.28 mmol/L (ref 1.15–1.40)
Calcium, Ion: 1.03 mmol/L — ABNORMAL LOW (ref 1.15–1.40)
Calcium, Ion: 0.95 mmol/L — ABNORMAL LOW (ref 1.15–1.40)
Chloride: 103 mmol/L (ref 98–111)
Chloride: 103 mmol/L (ref 98–111)
Chloride: 103 mmol/L (ref 98–111)
Chloride: 102 mmol/L (ref 98–111)
Creatinine, Ser: 0.5 mg/dL (ref 0.44–1.00)
Creatinine, Ser: 0.5 mg/dL (ref 0.44–1.00)
Creatinine, Ser: 0.5 mg/dL (ref 0.44–1.00)
Creatinine, Ser: 0.4 mg/dL — ABNORMAL LOW (ref 0.44–1.00)
Glucose, Bld: 106 mg/dL — ABNORMAL HIGH (ref 70–99)
Glucose, Bld: 118 mg/dL — ABNORMAL HIGH (ref 70–99)
Glucose, Bld: 127 mg/dL — ABNORMAL HIGH (ref 70–99)
Glucose, Bld: 122 mg/dL — ABNORMAL HIGH (ref 70–99)
HCT: 33 % — ABNORMAL LOW (ref 36.0–46.0)
HCT: 29 % — ABNORMAL LOW (ref 36.0–46.0)
HCT: 22 % — ABNORMAL LOW (ref 36.0–46.0)
HCT: 20 % — ABNORMAL LOW (ref 36.0–46.0)
Hemoglobin: 11.2 g/dL — ABNORMAL LOW (ref 12.0–15.0)
Hemoglobin: 9.9 g/dL — ABNORMAL LOW (ref 12.0–15.0)
Hemoglobin: 7.5 g/dL — ABNORMAL LOW (ref 12.0–15.0)
Hemoglobin: 6.8 g/dL — CL (ref 12.0–15.0)
Potassium: 3.6 mmol/L (ref 3.5–5.1)
Potassium: 3.6 mmol/L (ref 3.5–5.1)
Potassium: 5.1 mmol/L (ref 3.5–5.1)
Potassium: 4.6 mmol/L (ref 3.5–5.1)
Sodium: 139 mmol/L (ref 135–145)
Sodium: 137 mmol/L (ref 135–145)
Sodium: 134 mmol/L — ABNORMAL LOW (ref 135–145)
Sodium: 136 mmol/L (ref 135–145)
TCO2: 27 mmol/L (ref 22–32)
TCO2: 27 mmol/L (ref 22–32)
TCO2: 28 mmol/L (ref 22–32)
TCO2: 28 mmol/L (ref 22–32)

## 2021-03-24 LAB — POCT I-STAT 7, (LYTES, BLD GAS, ICA,H+H)
Acid-Base Excess: 1 mmol/L (ref 0.0–2.0)
Acid-Base Excess: 2 mmol/L (ref 0.0–2.0)
Acid-Base Excess: 5 mmol/L — ABNORMAL HIGH (ref 0.0–2.0)
Acid-Base Excess: 6 mmol/L — ABNORMAL HIGH (ref 0.0–2.0)
Acid-Base Excess: 2 mmol/L (ref 0.0–2.0)
Acid-base deficit: 2 mmol/L (ref 0.0–2.0)
Acid-base deficit: 2 mmol/L (ref 0.0–2.0)
Bicarbonate: 26.9 mmol/L (ref 20.0–28.0)
Bicarbonate: 26.9 mmol/L (ref 20.0–28.0)
Bicarbonate: 28.3 mmol/L — ABNORMAL HIGH (ref 20.0–28.0)
Bicarbonate: 28.9 mmol/L — ABNORMAL HIGH (ref 20.0–28.0)
Bicarbonate: 26.6 mmol/L (ref 20.0–28.0)
Bicarbonate: 24 mmol/L (ref 20.0–28.0)
Bicarbonate: 23 mmol/L (ref 20.0–28.0)
Calcium, Ion: 1.13 mmol/L — ABNORMAL LOW (ref 1.15–1.40)
Calcium, Ion: 0.89 mmol/L — CL (ref 1.15–1.40)
Calcium, Ion: 0.92 mmol/L — ABNORMAL LOW (ref 1.15–1.40)
Calcium, Ion: 0.99 mmol/L — ABNORMAL LOW (ref 1.15–1.40)
Calcium, Ion: 0.98 mmol/L — ABNORMAL LOW (ref 1.15–1.40)
Calcium, Ion: 1.05 mmol/L — ABNORMAL LOW (ref 1.15–1.40)
Calcium, Ion: 1.11 mmol/L — ABNORMAL LOW (ref 1.15–1.40)
HCT: 35 % — ABNORMAL LOW (ref 36.0–46.0)
HCT: 18 % — ABNORMAL LOW (ref 36.0–46.0)
HCT: 23 % — ABNORMAL LOW (ref 36.0–46.0)
HCT: 26 % — ABNORMAL LOW (ref 36.0–46.0)
HCT: 25 % — ABNORMAL LOW (ref 36.0–46.0)
HCT: 30 % — ABNORMAL LOW (ref 36.0–46.0)
HCT: 28 % — ABNORMAL LOW (ref 36.0–46.0)
Hemoglobin: 11.9 g/dL — ABNORMAL LOW (ref 12.0–15.0)
Hemoglobin: 6.1 g/dL — CL (ref 12.0–15.0)
Hemoglobin: 7.8 g/dL — ABNORMAL LOW (ref 12.0–15.0)
Hemoglobin: 8.8 g/dL — ABNORMAL LOW (ref 12.0–15.0)
Hemoglobin: 8.5 g/dL — ABNORMAL LOW (ref 12.0–15.0)
Hemoglobin: 10.2 g/dL — ABNORMAL LOW (ref 12.0–15.0)
Hemoglobin: 9.5 g/dL — ABNORMAL LOW (ref 12.0–15.0)
O2 Saturation: 100 %
O2 Saturation: 100 %
O2 Saturation: 100 %
O2 Saturation: 83 %
O2 Saturation: 100 %
O2 Saturation: 99 %
O2 Saturation: 99 %
Patient temperature: 35
Patient temperature: 37.3
Potassium: 3.7 mmol/L (ref 3.5–5.1)
Potassium: 4.2 mmol/L (ref 3.5–5.1)
Potassium: 4.3 mmol/L (ref 3.5–5.1)
Potassium: 5.2 mmol/L — ABNORMAL HIGH (ref 3.5–5.1)
Potassium: 4.5 mmol/L (ref 3.5–5.1)
Potassium: 4.2 mmol/L (ref 3.5–5.1)
Potassium: 3.7 mmol/L (ref 3.5–5.1)
Sodium: 139 mmol/L (ref 135–145)
Sodium: 134 mmol/L — ABNORMAL LOW (ref 135–145)
Sodium: 139 mmol/L (ref 135–145)
Sodium: 140 mmol/L (ref 135–145)
Sodium: 137 mmol/L (ref 135–145)
Sodium: 137 mmol/L (ref 135–145)
Sodium: 137 mmol/L (ref 135–145)
TCO2: 28 mmol/L (ref 22–32)
TCO2: 28 mmol/L (ref 22–32)
TCO2: 29 mmol/L (ref 22–32)
TCO2: 30 mmol/L (ref 22–32)
TCO2: 28 mmol/L (ref 22–32)
TCO2: 25 mmol/L (ref 22–32)
TCO2: 24 mmol/L (ref 22–32)
pCO2 arterial: 48.1 mmHg — ABNORMAL HIGH (ref 32.0–48.0)
pCO2 arterial: 34.2 mmHg (ref 32.0–48.0)
pCO2 arterial: 35.9 mmHg (ref 32.0–48.0)
pCO2 arterial: 44.6 mmHg (ref 32.0–48.0)
pCO2 arterial: 39.3 mmHg (ref 32.0–48.0)
pCO2 arterial: 43.6 mmHg (ref 32.0–48.0)
pCO2 arterial: 41.3 mmHg (ref 32.0–48.0)
pH, Arterial: 7.355 (ref 7.350–7.450)
pH, Arterial: 7.388 (ref 7.350–7.450)
pH, Arterial: 7.505 — ABNORMAL HIGH (ref 7.350–7.450)
pH, Arterial: 7.534 — ABNORMAL HIGH (ref 7.350–7.450)
pH, Arterial: 7.438 (ref 7.350–7.450)
pH, Arterial: 7.339 — ABNORMAL LOW (ref 7.350–7.450)
pH, Arterial: 7.356 (ref 7.350–7.450)
pO2, Arterial: 465 mmHg — ABNORMAL HIGH (ref 83.0–108.0)
pO2, Arterial: 383 mmHg — ABNORMAL HIGH (ref 83.0–108.0)
pO2, Arterial: 401 mmHg — ABNORMAL HIGH (ref 83.0–108.0)
pO2, Arterial: 48 mmHg — ABNORMAL LOW (ref 83.0–108.0)
pO2, Arterial: 414 mmHg — ABNORMAL HIGH (ref 83.0–108.0)
pO2, Arterial: 142 mmHg — ABNORMAL HIGH (ref 83.0–108.0)
pO2, Arterial: 146 mmHg — ABNORMAL HIGH (ref 83.0–108.0)

## 2021-03-24 LAB — HEPARIN LEVEL (UNFRACTIONATED): Heparin Unfractionated: 0.51 IU/mL (ref 0.30–0.70)

## 2021-03-24 LAB — PROTIME-INR
INR: 1 (ref 0.8–1.2)
INR: 1.3 — ABNORMAL HIGH (ref 0.8–1.2)
Prothrombin Time: 13.5 seconds (ref 11.4–15.2)
Prothrombin Time: 16.4 seconds — ABNORMAL HIGH (ref 11.4–15.2)

## 2021-03-24 LAB — CBC
HCT: 39 % (ref 36.0–46.0)
HCT: 32.5 % — ABNORMAL LOW (ref 36.0–46.0)
Hemoglobin: 12.9 g/dL (ref 12.0–15.0)
Hemoglobin: 11 g/dL — ABNORMAL LOW (ref 12.0–15.0)
MCH: 29.7 pg (ref 26.0–34.0)
MCH: 30.1 pg (ref 26.0–34.0)
MCHC: 33.1 g/dL (ref 30.0–36.0)
MCHC: 33.8 g/dL (ref 30.0–36.0)
MCV: 89.9 fL (ref 80.0–100.0)
MCV: 89 fL (ref 80.0–100.0)
Platelets: 345 10*3/uL (ref 150–400)
Platelets: 181 10*3/uL (ref 150–400)
RBC: 4.34 MIL/uL (ref 3.87–5.11)
RBC: 3.65 MIL/uL — ABNORMAL LOW (ref 3.87–5.11)
RDW: 14 % (ref 11.5–15.5)
RDW: 14.7 % (ref 11.5–15.5)
WBC: 11.3 10*3/uL — ABNORMAL HIGH (ref 4.0–10.5)
WBC: 16.1 10*3/uL — ABNORMAL HIGH (ref 4.0–10.5)
nRBC: 0 % (ref 0.0–0.2)
nRBC: 0 % (ref 0.0–0.2)

## 2021-03-24 LAB — APTT
aPTT: 84 seconds — ABNORMAL HIGH (ref 24–36)
aPTT: 32 seconds (ref 24–36)

## 2021-03-24 LAB — BASIC METABOLIC PANEL
Anion gap: 3 — ABNORMAL LOW (ref 5–15)
BUN: 14 mg/dL (ref 8–23)
CO2: 27 mmol/L (ref 22–32)
Calcium: 9.5 mg/dL (ref 8.9–10.3)
Chloride: 109 mmol/L (ref 98–111)
Creatinine, Ser: 0.82 mg/dL (ref 0.44–1.00)
GFR, Estimated: >60 mL/min (ref >60)
Glucose, Bld: 102 mg/dL — ABNORMAL HIGH (ref 70–99)
Potassium: 5 mmol/L (ref 3.5–5.1)
Sodium: 139 mmol/L (ref 135–145)

## 2021-03-24 LAB — SURGICAL PCR SCREEN
MRSA, PCR: NEGATIVE
Staphylococcus aureus: POSITIVE — AB

## 2021-03-24 LAB — PLATELET COUNT: Platelets: 230 10*3/uL (ref 150–400)

## 2021-03-24 LAB — ECHO INTRAOPERATIVE TEE
Height: 63 in
Weight: 2179.91 oz

## 2021-03-24 LAB — HEMOGLOBIN AND HEMATOCRIT, BLOOD
HCT: 22.9 % — ABNORMAL LOW (ref 36.0–46.0)
Hemoglobin: 7.8 g/dL — ABNORMAL LOW (ref 12.0–15.0)

## 2021-03-24 LAB — PREPARE RBC (CROSSMATCH)

## 2021-03-24 SURGERY — CORONARY ARTERY BYPASS GRAFTING (CABG)
Anesthesia: General | Site: Chest

## 2021-03-24 MED ORDER — CHLORHEXIDINE GLUCONATE 0.12 % MT SOLN
15.0000 mL | Freq: Once | OROMUCOSAL | Status: AC
Start: 2021-03-24 — End: 2021-03-24

## 2021-03-24 MED ORDER — SODIUM CHLORIDE 0.9 % IV SOLN: INTRAVENOUS | Status: DC

## 2021-03-24 MED ORDER — PLASMA-LYTE A IV SOLN: INTRAVENOUS | Status: DC

## 2021-03-24 MED ORDER — CHLORHEXIDINE GLUCONATE 0.12 % MT SOLN
OROMUCOSAL | Status: AC
  Administered 2021-03-24: 15 mL via OROMUCOSAL
  Filled 2021-03-24: qty 15

## 2021-03-24 MED ORDER — NITROGLYCERIN IN D5W 200-5 MCG/ML-% IV SOLN: 0.0000 ug/min | INTRAVENOUS | Status: DC

## 2021-03-24 MED ORDER — LEVALBUTEROL HCL 0.63 MG/3ML IN NEBU
0.6300 mg | INHALATION_SOLUTION | Freq: Four times a day (QID) | RESPIRATORY_TRACT | Status: DC
  Administered 2021-03-24 – 2021-03-25 (×3): 0.63 mg via RESPIRATORY_TRACT
  Filled 2021-03-24 (×3): qty 3

## 2021-03-24 MED ORDER — FAMOTIDINE IN NACL 20-0.9 MG/50ML-% IV SOLN
20.0000 mg | Freq: Two times a day (BID) | INTRAVENOUS | Status: AC
  Administered 2021-03-24 (×2): 20 mg via INTRAVENOUS
  Filled 2021-03-24 (×2): qty 50

## 2021-03-24 MED ORDER — SODIUM CHLORIDE 0.9% IV SOLUTION
Freq: Once | INTRAVENOUS | Status: AC
  Administered 2021-03-24: 100 mL via INTRAVENOUS

## 2021-03-24 MED ORDER — PLATELET RICH PLASMA OPTIME
Status: DC | PRN
  Administered 2021-03-24: 10 mL

## 2021-03-24 MED ORDER — MAGNESIUM SULFATE 4 GM/100ML IV SOLN
4.0000 g | Freq: Once | INTRAVENOUS | Status: AC
Start: 2021-03-24 — End: 2021-03-24
  Administered 2021-03-24: 4 g via INTRAVENOUS
  Filled 2021-03-24: qty 100

## 2021-03-24 MED ORDER — PLASMA-LYTE A IV SOLN
INTRAVENOUS | Status: DC | PRN
  Administered 2021-03-24: 1000 mL

## 2021-03-24 MED ORDER — LACTATED RINGERS IV SOLN: INTRAVENOUS | Status: DC

## 2021-03-24 MED ORDER — BUPIVACAINE HCL (PF) 0.5 % IJ SOLN
INTRAMUSCULAR | Status: AC
  Filled 2021-03-24: qty 30

## 2021-03-24 MED ORDER — PHENYLEPHRINE HCL (PRESSORS) 10 MG/ML IV SOLN
INTRAVENOUS | Status: DC | PRN
  Administered 2021-03-24 (×2): 80 ug via INTRAVENOUS

## 2021-03-24 MED ORDER — AMIODARONE LOAD VIA INFUSION
150.0000 mg | Freq: Once | INTRAVENOUS | Status: AC
  Filled 2021-03-24: qty 83.34

## 2021-03-24 MED ORDER — ACETAMINOPHEN 160 MG/5ML PO SOLN
1000.0000 mg | Freq: Four times a day (QID) | ORAL | Status: DC
  Administered 2021-03-24: 1000 mg
  Filled 2021-03-24: qty 40.6

## 2021-03-24 MED ORDER — THROMBIN 5000 UNITS EX SOLR
INTRAVENOUS | Status: DC | PRN
  Administered 2021-03-24: 2 mL

## 2021-03-24 MED ORDER — ROCURONIUM BROMIDE 100 MG/10ML IV SOLN
INTRAVENOUS | Status: DC | PRN
  Administered 2021-03-24: 100 mg via INTRAVENOUS

## 2021-03-24 MED ORDER — LACTATED RINGERS IV SOLN: 500.0000 mL | Freq: Once | INTRAVENOUS | Status: DC | PRN

## 2021-03-24 MED ORDER — STERILE WATER FOR INJECTION IV SOLN
INTRAVENOUS | Status: DC | PRN
  Administered 2021-03-24: .5 mL

## 2021-03-24 MED ORDER — VASOPRESSIN 20 UNIT/ML IV SOLN
INTRAVENOUS | Status: AC
  Filled 2021-03-24: qty 1

## 2021-03-24 MED ORDER — SODIUM CHLORIDE 0.9% FLUSH
3.0000 mL | Freq: Two times a day (BID) | INTRAVENOUS | Status: DC
  Administered 2021-03-25 – 2021-03-29 (×9): 3 mL via INTRAVENOUS

## 2021-03-24 MED ORDER — LEVALBUTEROL TARTRATE 45 MCG/ACT IN AERO: 2.0000 | INHALATION_SPRAY | Freq: Four times a day (QID) | RESPIRATORY_TRACT | Status: DC

## 2021-03-24 MED ORDER — PROPOFOL 10 MG/ML IV BOLUS
INTRAVENOUS | Status: DC | PRN
  Administered 2021-03-24: 30 mg via INTRAVENOUS
  Administered 2021-03-24: 50 mg via INTRAVENOUS

## 2021-03-24 MED ORDER — 0.9 % SODIUM CHLORIDE (POUR BTL) OPTIME
TOPICAL | Status: DC | PRN
  Administered 2021-03-24: 5000 mL

## 2021-03-24 MED ORDER — METOPROLOL TARTRATE 5 MG/5ML IV SOLN
2.5000 mg | INTRAVENOUS | Status: DC | PRN
Start: 2021-03-24 — End: 2021-03-30

## 2021-03-24 MED ORDER — MIDAZOLAM HCL 5 MG/5ML IJ SOLN
INTRAMUSCULAR | Status: DC | PRN
  Administered 2021-03-24 (×4): 2 mg via INTRAVENOUS

## 2021-03-24 MED ORDER — BUPIVACAINE LIPOSOME 1.3 % IJ SUSP
INTRAMUSCULAR | Status: DC | PRN
  Administered 2021-03-24: 50 mL

## 2021-03-24 MED ORDER — OXYCODONE HCL 5 MG PO TABS: 5.0000 mg | ORAL_TABLET | ORAL | Status: DC | PRN

## 2021-03-24 MED ORDER — VANCOMYCIN HCL 1000 MG IV SOLR
INTRAVENOUS | Status: AC
  Filled 2021-03-24: qty 3000

## 2021-03-24 MED ORDER — ONDANSETRON HCL 4 MG/2ML IJ SOLN
4.0000 mg | Freq: Four times a day (QID) | INTRAMUSCULAR | Status: DC | PRN
  Administered 2021-03-25 – 2021-03-27 (×7): 4 mg via INTRAVENOUS
  Filled 2021-03-24 (×8): qty 2

## 2021-03-24 MED ORDER — SODIUM CHLORIDE 0.9 % IV SOLN: 250.0000 mL | INTRAVENOUS | Status: DC

## 2021-03-24 MED ORDER — COLCHICINE 0.3 MG HALF TABLET
0.3000 mg | ORAL_TABLET | Freq: Two times a day (BID) | ORAL | Status: DC
  Administered 2021-03-24 – 2021-03-30 (×11): 0.3 mg via ORAL
  Filled 2021-03-24 (×14): qty 1

## 2021-03-24 MED ORDER — HEMOSTATIC AGENTS (NO CHARGE) OPTIME
TOPICAL | Status: DC | PRN
  Administered 2021-03-24: 1 via TOPICAL

## 2021-03-24 MED ORDER — INSULIN REGULAR(HUMAN) IN NACL 100-0.9 UT/100ML-% IV SOLN: INTRAVENOUS | Status: DC

## 2021-03-24 MED ORDER — DEXTROSE 50 % IV SOLN: 0.0000 mL | INTRAVENOUS | Status: DC | PRN

## 2021-03-24 MED ORDER — POTASSIUM CHLORIDE 10 MEQ/50ML IV SOLN: 10.0000 meq | INTRAVENOUS | Status: AC

## 2021-03-24 MED ORDER — METOPROLOL TARTRATE 25 MG/10 ML ORAL SUSPENSION: 12.5000 mg | Freq: Two times a day (BID) | ORAL | Status: DC

## 2021-03-24 MED ORDER — PROTAMINE SULFATE 10 MG/ML IV SOLN
INTRAVENOUS | Status: DC | PRN
  Administered 2021-03-24: 180 mg via INTRAVENOUS
  Administered 2021-03-24: 10 mg via INTRAVENOUS

## 2021-03-24 MED ORDER — BUPIVACAINE LIPOSOME 1.3 % IJ SUSP
INTRAMUSCULAR | Status: AC
  Filled 2021-03-24: qty 20

## 2021-03-24 MED ORDER — DEXMEDETOMIDINE HCL IN NACL 400 MCG/100ML IV SOLN: 0.0000 ug/kg/h | INTRAVENOUS | Status: DC

## 2021-03-24 MED ORDER — SODIUM CHLORIDE 0.9% FLUSH: 3.0000 mL | INTRAVENOUS | Status: DC | PRN

## 2021-03-24 MED ORDER — CHLORHEXIDINE GLUCONATE 0.12 % MT SOLN
15.0000 mL | OROMUCOSAL | Status: AC
  Administered 2021-03-24: 15 mL via OROMUCOSAL

## 2021-03-24 MED ORDER — MIDAZOLAM HCL 2 MG/2ML IJ SOLN: 2.0000 mg | INTRAMUSCULAR | Status: DC | PRN

## 2021-03-24 MED ORDER — BISACODYL 10 MG RE SUPP: 10.0000 mg | Freq: Every day | RECTAL | Status: DC

## 2021-03-24 MED ORDER — DOCUSATE SODIUM 100 MG PO CAPS
200.0000 mg | ORAL_CAPSULE | Freq: Every day | ORAL | Status: DC
  Administered 2021-03-25 – 2021-03-27 (×2): 200 mg via ORAL
  Filled 2021-03-24 (×2): qty 2

## 2021-03-24 MED ORDER — BISACODYL 5 MG PO TBEC
10.0000 mg | DELAYED_RELEASE_TABLET | Freq: Every day | ORAL | Status: DC
  Administered 2021-03-25: 10 mg via ORAL
  Filled 2021-03-24: qty 2

## 2021-03-24 MED ORDER — CEFAZOLIN SODIUM-DEXTROSE 2-4 GM/100ML-% IV SOLN
2.0000 g | Freq: Three times a day (TID) | INTRAVENOUS | Status: AC
  Administered 2021-03-24 – 2021-03-26 (×6): 2 g via INTRAVENOUS
  Filled 2021-03-24 (×7): qty 100

## 2021-03-24 MED ORDER — HEPARIN SODIUM (PORCINE) 1000 UNIT/ML IJ SOLN
INTRAMUSCULAR | Status: DC | PRN
  Administered 2021-03-24: 19000 [IU] via INTRAVENOUS
  Administered 2021-03-24: 10000 [IU] via INTRAVENOUS

## 2021-03-24 MED ORDER — VANCOMYCIN HCL IN DEXTROSE 1-5 GM/200ML-% IV SOLN
1000.0000 mg | Freq: Once | INTRAVENOUS | Status: AC
  Administered 2021-03-24: 1000 mg via INTRAVENOUS
  Filled 2021-03-24: qty 200

## 2021-03-24 MED ORDER — ALBUMIN HUMAN 5 % IV SOLN
250.0000 mL | INTRAVENOUS | Status: AC | PRN
  Administered 2021-03-24 (×4): 12.5 g via INTRAVENOUS
  Filled 2021-03-24: qty 250

## 2021-03-24 MED ORDER — LACTATED RINGERS IV SOLN: INTRAVENOUS | Status: DC | PRN

## 2021-03-24 MED ORDER — PANTOPRAZOLE SODIUM 40 MG PO TBEC
40.0000 mg | DELAYED_RELEASE_TABLET | Freq: Every day | ORAL | Status: DC
  Administered 2021-03-27 – 2021-03-30 (×4): 40 mg via ORAL
  Filled 2021-03-24 (×4): qty 1

## 2021-03-24 MED ORDER — ACETAMINOPHEN 500 MG PO TABS
1000.0000 mg | ORAL_TABLET | Freq: Four times a day (QID) | ORAL | Status: AC
  Administered 2021-03-25 – 2021-03-29 (×9): 1000 mg via ORAL
  Filled 2021-03-24 (×10): qty 2

## 2021-03-24 MED ORDER — METOPROLOL TARTRATE 12.5 MG HALF TABLET
12.5000 mg | ORAL_TABLET | Freq: Two times a day (BID) | ORAL | Status: DC
  Administered 2021-03-25 – 2021-03-26 (×3): 12.5 mg via ORAL
  Filled 2021-03-24 (×3): qty 1

## 2021-03-24 MED ORDER — AMIODARONE HCL IN DEXTROSE 360-4.14 MG/200ML-% IV SOLN
60.0000 mg/h | INTRAVENOUS | Status: AC
  Administered 2021-03-24: 60 mg/h via INTRAVENOUS

## 2021-03-24 MED ORDER — SODIUM CHLORIDE 0.45 % IV SOLN: INTRAVENOUS | Status: DC | PRN

## 2021-03-24 MED ORDER — CHLORHEXIDINE GLUCONATE CLOTH 2 % EX PADS
6.0000 | MEDICATED_PAD | Freq: Every day | CUTANEOUS | Status: AC
  Administered 2021-03-24 – 2021-03-26 (×3): 6 via TOPICAL

## 2021-03-24 MED ORDER — ACETAMINOPHEN 160 MG/5ML PO SOLN: 650.0000 mg | Freq: Once | ORAL | Status: AC

## 2021-03-24 MED ORDER — FENTANYL CITRATE (PF) 250 MCG/5ML IJ SOLN
INTRAMUSCULAR | Status: AC
  Filled 2021-03-24: qty 20

## 2021-03-24 MED ORDER — POTASSIUM CHLORIDE 10 MEQ/50ML IV SOLN
10.0000 meq | INTRAVENOUS | Status: AC
  Administered 2021-03-24 (×3): 10 meq via INTRAVENOUS
  Filled 2021-03-24: qty 50

## 2021-03-24 MED ORDER — STERILE WATER FOR INJECTION IJ SOLN
INTRAMUSCULAR | Status: AC
  Filled 2021-03-24: qty 10

## 2021-03-24 MED ORDER — AMIODARONE HCL IN DEXTROSE 360-4.14 MG/200ML-% IV SOLN
INTRAVENOUS | Status: AC
  Administered 2021-03-24: 150 mg via INTRAVENOUS
  Filled 2021-03-24: qty 200

## 2021-03-24 MED ORDER — MIDAZOLAM HCL (PF) 10 MG/2ML IJ SOLN
INTRAMUSCULAR | Status: AC
  Filled 2021-03-24: qty 2

## 2021-03-24 MED ORDER — ASPIRIN 81 MG PO CHEW: 324.0000 mg | CHEWABLE_TABLET | Freq: Every day | ORAL | Status: DC

## 2021-03-24 MED ORDER — VANCOMYCIN HCL 1000 MG IV SOLR
INTRAVENOUS | Status: DC | PRN
  Administered 2021-03-24: 3 g

## 2021-03-24 MED ORDER — ALBUMIN HUMAN 5 % IV SOLN: INTRAVENOUS | Status: DC | PRN

## 2021-03-24 MED ORDER — ACETAMINOPHEN 650 MG RE SUPP
650.0000 mg | Freq: Once | RECTAL | Status: AC
  Administered 2021-03-24: 650 mg via RECTAL

## 2021-03-24 MED ORDER — PHENYLEPHRINE HCL-NACL 20-0.9 MG/250ML-% IV SOLN
0.0000 ug/min | INTRAVENOUS | Status: DC
Start: 2021-03-24 — End: 2021-03-25

## 2021-03-24 MED ORDER — FENTANYL CITRATE (PF) 100 MCG/2ML IJ SOLN
INTRAMUSCULAR | Status: DC | PRN
  Administered 2021-03-24: 50 ug via INTRAVENOUS
  Administered 2021-03-24: 150 ug via INTRAVENOUS
  Administered 2021-03-24: 50 ug via INTRAVENOUS
  Administered 2021-03-24 (×3): 100 ug via INTRAVENOUS
  Administered 2021-03-24 (×2): 150 ug via INTRAVENOUS
  Administered 2021-03-24: 100 ug via INTRAVENOUS
  Administered 2021-03-24: 50 ug via INTRAVENOUS

## 2021-03-24 MED ORDER — ASPIRIN EC 325 MG PO TBEC
325.0000 mg | DELAYED_RELEASE_TABLET | Freq: Every day | ORAL | Status: DC
  Administered 2021-03-25 – 2021-03-27 (×3): 325 mg via ORAL
  Filled 2021-03-24 (×3): qty 1

## 2021-03-24 MED ORDER — MORPHINE SULFATE (PF) 2 MG/ML IV SOLN
1.0000 mg | INTRAVENOUS | Status: DC | PRN
  Administered 2021-03-25: 1 mg via INTRAVENOUS
  Filled 2021-03-24: qty 1

## 2021-03-24 MED ORDER — TRAMADOL HCL 50 MG PO TABS
50.0000 mg | ORAL_TABLET | Freq: Four times a day (QID) | ORAL | Status: DC | PRN
Start: 2021-03-24 — End: 2021-03-30
  Administered 2021-03-25: 50 mg via ORAL
  Filled 2021-03-24: qty 1

## 2021-03-24 MED ORDER — MUPIROCIN 2 % EX OINT
1.0000 "application " | TOPICAL_OINTMENT | Freq: Two times a day (BID) | CUTANEOUS | Status: AC
  Administered 2021-03-24 – 2021-03-28 (×10): 1 via NASAL
  Filled 2021-03-24 (×2): qty 22

## 2021-03-24 MED ORDER — AMIODARONE HCL IN DEXTROSE 360-4.14 MG/200ML-% IV SOLN
30.0000 mg/h | INTRAVENOUS | Status: DC
  Administered 2021-03-25 – 2021-03-26 (×4): 30 mg/h via INTRAVENOUS
  Filled 2021-03-24 (×3): qty 200

## 2021-03-24 MED ORDER — LIDOCAINE HCL (CARDIAC) PF 100 MG/5ML IV SOSY
PREFILLED_SYRINGE | INTRAVENOUS | Status: DC | PRN
  Administered 2021-03-24: 2 mg via INTRAVENOUS

## 2021-03-24 MED ORDER — ORAL CARE MOUTH RINSE: 15.0000 mL | Freq: Once | OROMUCOSAL | Status: AC

## 2021-03-24 MED ORDER — PLATELET POOR PLASMA OPTIME
Status: DC | PRN
  Administered 2021-03-24: 10 mL

## 2021-03-24 MED ORDER — PROPOFOL 10 MG/ML IV BOLUS
INTRAVENOUS | Status: AC
  Filled 2021-03-24: qty 20

## 2021-03-24 MED ORDER — PHENYLEPHRINE HCL-NACL 10-0.9 MG/250ML-% IV SOLN
INTRAVENOUS | Status: DC | PRN
  Administered 2021-03-24: 20 ug/min via INTRAVENOUS

## 2021-03-24 SURGICAL SUPPLY — 90 items
ADAPTER CARDIO PERF ANTE/RETRO (ADAPTER) ×3 IMPLANT
ADH SKN CLS APL DERMABOND .7 (GAUZE/BANDAGES/DRESSINGS) ×2
ADPR PRFSN 84XANTGRD RTRGD (ADAPTER) ×2
BAG DECANTER FOR FLEXI CONT (MISCELLANEOUS) ×3 IMPLANT
BLADE CLIPPER SURG (BLADE) ×3 IMPLANT
BLADE STERNUM SYSTEM 6 (BLADE) ×3 IMPLANT
BNDG CMPR MED 15X6 ELC VLCR LF (GAUZE/BANDAGES/DRESSINGS) ×2
BNDG ELASTIC 4X5.8 VLCR STR LF (GAUZE/BANDAGES/DRESSINGS) ×3 IMPLANT
BNDG ELASTIC 6X15 VLCR STRL LF (GAUZE/BANDAGES/DRESSINGS) ×3 IMPLANT
BNDG ELASTIC 6X5.8 VLCR STR LF (GAUZE/BANDAGES/DRESSINGS) ×3 IMPLANT
BNDG GAUZE ELAST 4 BULKY (GAUZE/BANDAGES/DRESSINGS) ×3 IMPLANT
CANISTER SUCT 3000ML PPV (MISCELLANEOUS) ×3 IMPLANT
CANNULA NON VENT 18FR 12 (CANNULA) ×1 IMPLANT
CATH CPB KIT HENDRICKSON (MISCELLANEOUS) ×3 IMPLANT
CATH ROBINSON RED A/P 18FR (CATHETERS) ×6 IMPLANT
CLIP RETRACTION 3.0MM CORONARY (MISCELLANEOUS) ×3 IMPLANT
CLIP VESOCCLUDE SM WIDE 24/CT (CLIP) ×9 IMPLANT
CONTAINER PROTECT SURGISLUSH (MISCELLANEOUS) ×3 IMPLANT
COVER MAYO STAND STRL (DRAPES) ×2 IMPLANT
DERMABOND ADVANCED (GAUZE/BANDAGES/DRESSINGS) ×1
DERMABOND ADVANCED .7 DNX12 (GAUZE/BANDAGES/DRESSINGS) ×2 IMPLANT
DRAIN CHANNEL 28F RND 3/8 FF (WOUND CARE) ×9 IMPLANT
DRAPE CARDIOVASCULAR INCISE (DRAPES) ×3
DRAPE SRG 135X102X78XABS (DRAPES) ×2 IMPLANT
DRAPE WARM FLUID 44X44 (DRAPES) ×3 IMPLANT
DRSG AQUACEL AG ADV 3.5X14 (GAUZE/BANDAGES/DRESSINGS) ×3 IMPLANT
ELECT CAUTERY BLADE 6.4 (BLADE) ×3 IMPLANT
ELECT REM PT RETURN 9FT ADLT (ELECTROSURGICAL) ×6
ELECTRODE REM PT RTRN 9FT ADLT (ELECTROSURGICAL) ×4 IMPLANT
FELT TEFLON 1X6 (MISCELLANEOUS) ×6 IMPLANT
GAUZE SPONGE 4X4 12PLY STRL (GAUZE/BANDAGES/DRESSINGS) ×6 IMPLANT
GLOVE SURG NEOP MICRO LF SZ7.5 (GLOVE) ×9 IMPLANT
GOWN STRL REUS W/ TWL LRG LVL3 (GOWN DISPOSABLE) ×8 IMPLANT
GOWN STRL REUS W/TWL LRG LVL3 (GOWN DISPOSABLE) ×12
HEMOSTAT POWDER SURGIFOAM 1G (HEMOSTASIS) ×6 IMPLANT
INSERT FOGARTY XLG (MISCELLANEOUS) ×3 IMPLANT
INSERT SUTURE HOLDER (MISCELLANEOUS) ×3 IMPLANT
KIT APPLICATOR RATIO 11:1 (KITS) ×1 IMPLANT
KIT BASIN OR (CUSTOM PROCEDURE TRAY) ×3 IMPLANT
KIT DISPOSABLE SPY ELITE (KITS) ×2 IMPLANT
KIT SUCTION CATH 14FR (SUCTIONS) ×3 IMPLANT
KIT TURNOVER KIT B (KITS) ×3 IMPLANT
KIT VASOVIEW HEMOPRO 2 VH 4000 (KITS) ×3 IMPLANT
MARKER GRAFT CORONARY BYPASS (MISCELLANEOUS) ×9 IMPLANT
NDL 18GX1X1/2 (RX/OR ONLY) (NEEDLE) ×1 IMPLANT
NEEDLE 18GX1X1/2 (RX/OR ONLY) (NEEDLE) ×3 IMPLANT
NS IRRIG 1000ML POUR BTL (IV SOLUTION) ×15 IMPLANT
PACK E OPEN HEART (SUTURE) ×3 IMPLANT
PACK OPEN HEART (CUSTOM PROCEDURE TRAY) ×3 IMPLANT
PACK PLATELET PROCEDURE 60 (MISCELLANEOUS) ×3 IMPLANT
PACK SPY-PHI (KITS) IMPLANT
PAD ARMBOARD 7.5X6 YLW CONV (MISCELLANEOUS) ×6 IMPLANT
PAD ELECT DEFIB RADIOL ZOLL (MISCELLANEOUS) ×3 IMPLANT
PENCIL BUTTON HOLSTER BLD 10FT (ELECTRODE) ×3 IMPLANT
POSITIONER HEAD DONUT 9IN (MISCELLANEOUS) ×3 IMPLANT
POWDER SURGICEL 3.0 GRAM (HEMOSTASIS) ×3 IMPLANT
PUNCH AORTIC ROTATE 4.5MM 8IN (MISCELLANEOUS) ×3 IMPLANT
SEALANT SURG COSEAL 4ML (VASCULAR PRODUCTS) ×5 IMPLANT
SET MPS 3-ND DEL (MISCELLANEOUS) ×2 IMPLANT
SUT BONE WAX W31G (SUTURE) ×3 IMPLANT
SUT MNCRL AB 3-0 PS2 18 (SUTURE) ×6 IMPLANT
SUT MNCRL AB 4-0 PS2 18 (SUTURE) ×1 IMPLANT
SUT PDS AB 1 CTX 36 (SUTURE) ×6 IMPLANT
SUT PROLENE 3 0 SH DA (SUTURE) ×3 IMPLANT
SUT PROLENE 5 0 C 1 36 (SUTURE) IMPLANT
SUT PROLENE 6 0 C 1 30 (SUTURE) ×9 IMPLANT
SUT PROLENE 7 0 BV 1 (SUTURE) ×2 IMPLANT
SUT PROLENE 8 0 BV175 6 (SUTURE) IMPLANT
SUT PROLENE BLUE 7 0 (SUTURE) ×3 IMPLANT
SUT SILK 2 0 SH CR/8 (SUTURE) IMPLANT
SUT SILK 3 0 SH CR/8 (SUTURE) IMPLANT
SUT STEEL 6MS V (SUTURE) ×3 IMPLANT
SUT STEEL SZ 6 DBL 3X14 BALL (SUTURE) ×3 IMPLANT
SUT VIC AB 2-0 CT1 27 (SUTURE) ×3
SUT VIC AB 2-0 CT1 TAPERPNT 27 (SUTURE) IMPLANT
SUT VIC AB 2-0 CTX 27 (SUTURE) IMPLANT
SUT VIC AB 3-0 X1 27 (SUTURE) IMPLANT
SYR 10ML LL (SYRINGE) IMPLANT
SYR 30ML LL (SYRINGE) ×3 IMPLANT
SYR 3ML LL SCALE MARK (SYRINGE) ×3 IMPLANT
SYSTEM SAHARA CHEST DRAIN ATS (WOUND CARE) ×3 IMPLANT
TAPE CLOTH SURG 4X10 WHT LF (GAUZE/BANDAGES/DRESSINGS) ×1 IMPLANT
TAPE PAPER 2X10 WHT MICROPORE (GAUZE/BANDAGES/DRESSINGS) ×1 IMPLANT
TIP DUAL SPRAY TOPICAL (TIP) ×2 IMPLANT
TOWEL GREEN STERILE (TOWEL DISPOSABLE) ×3 IMPLANT
TOWEL GREEN STERILE FF (TOWEL DISPOSABLE) ×3 IMPLANT
TRAY FOLEY SLVR 16FR TEMP STAT (SET/KITS/TRAYS/PACK) ×3 IMPLANT
TUBING LAP HI FLOW INSUFFLATIO (TUBING) ×3 IMPLANT
UNDERPAD 30X36 HEAVY ABSORB (UNDERPADS AND DIAPERS) ×3 IMPLANT
WATER STERILE IRR 1000ML POUR (IV SOLUTION) ×6 IMPLANT

## 2021-03-24 NOTE — Anesthesia Procedure Notes (Signed)
Central Venous Catheter Insertion Performed by: Beryle Lathe, MD, anesthesiologist Start/End7/04/2021 12:12 PM, 03/24/2021 12:19 PM Preanesthetic checklist: patient identified, IV checked, risks and benefits discussed, surgical consent, monitors and equipment checked, pre-op evaluation, timeout performed and anesthesia consent Position: Trendelenburg Lidocaine 1% used for infiltration and patient sedated Hand hygiene performed , maximum sterile barriers used  and Seldinger technique used Catheter size: 8.5 Fr Central line was placed.Sheath introducer Procedure performed using ultrasound guided technique. Ultrasound Notes:anatomy identified, needle tip was noted to be adjacent to the nerve/plexus identified, no ultrasound evidence of intravascular and/or intraneural injection and image(s) printed for medical record Attempts: 1 Following insertion, line sutured, dressing applied and Biopatch. Post procedure assessment: blood return through all ports, free fluid flow and no air  Patient tolerated the procedure well with no immediate complications.

## 2021-03-24 NOTE — H&P (Signed)
History and Physical Interval Note:  03/24/2021 12:47 PM  Kimberly Calhoun  has presented today for surgery, with the diagnosis of CAD.  The various methods of treatment have been discussed with the patient and family. After consideration of risks, benefits and other options for treatment, the patient has consented to  Procedure(s): CORONARY ARTERY BYPASS GRAFTING (CABG) (N/A) TRANSESOPHAGEAL ECHOCARDIOGRAM (TEE) (N/A) as a surgical intervention.  The patient's history has been reviewed, patient examined, no change in status, stable for surgery.  I have reviewed the patient's chart and labs.  Questions were answered to the patient's satisfaction.     Linden Dolin

## 2021-03-24 NOTE — Transfer of Care (Signed)
Immediate Anesthesia Transfer of Care Note  Patient: Kimberly Calhoun  Procedure(s) Performed: CORONARY ARTERY BYPASS GRAFTING (CABG) TIMES THREE USING LEFT INTERNAL MAMMARY ARTERY AND RIGHT GREATER SAPHENOUS VEIN HARVESTED ENDOSCOPICALLY (Chest) TRANSESOPHAGEAL ECHOCARDIOGRAM (TEE)  Patient Location:CICU  Anesthesia Type:General  Level of Consciousness: sedated and Patient remains intubated per anesthesia plan  Airway & Oxygen Therapy: Patient remains intubated per anesthesia plan and Patient placed on Ventilator (see vital sign flow sheet for setting)  Post-op Assessment: Report given to RN and Post -op Vital signs reviewed and stable  Post vital signs: Reviewed and stable  Last Vitals:  Vitals Value Taken Time  BP 114/64 03/24/21 1706  Temp 35.2 C 03/24/21 1709  Pulse 74 03/24/21 1709  Resp 21 03/24/21 1709  SpO2 100 % 03/24/21 1709  Vitals shown include unvalidated device data.  Last Pain:  Vitals:   03/24/21 1136  TempSrc: Oral  PainSc:       Patients Stated Pain Goal: 0 (03/23/21 1930)  Complications: No notable events documented.

## 2021-03-24 NOTE — Anesthesia Preprocedure Evaluation (Addendum)
Anesthesia Evaluation  Patient identified by MRN, date of birth, ID band Patient awake    Reviewed: Allergy & Precautions, NPO status , Patient's Chart, lab work & pertinent test results  History of Anesthesia Complications Negative for: history of anesthetic complications  Airway Mallampati: II  TM Distance: >3 FB Neck ROM: Full    Dental  (+) Dental Advisory Given, Teeth Intact   Pulmonary neg pulmonary ROS,    Pulmonary exam normal        Cardiovascular + CAD and + Past MI  Normal cardiovascular exam   '22 Carotid US - 1-39% b/l ICAS  '22 Cath - Mid LM to Dist LM lesion is 40% stenosed. Prox LAD lesion is 90% stenosed. Mid LAD to Dist LAD lesion is 99% stenosed. Prox RCA lesion is 50% stenosed. Dist RCA lesion is 30% stenosed. Prox RCA to Mid RCA lesion is 80% stenosed. RPDA lesion is 80% stenosed. Dist LAD-1 lesion is 99% stenosed. Dist LAD-2 lesion is 90% stenosed. Prox LAD to Mid LAD lesion is 40% stenosed with 40% stenosed side branch in 1st Diag. The left ventricular ejection fraction is greater than 65% by visual estimate  '22 TTE - EF 60 to 65%. There is moderate asymmetric left ventricular hypertrophy of the basal-septal segment. Left ventricular diastolic parameters are indeterminate. There is hypokinesis of the left ventricular, basal-mid  anteroseptal wall and inferoseptal wall. Mild mitral valve regurgitation.     Neuro/Psych  Headaches, negative psych ROS   GI/Hepatic negative GI ROS, Neg liver ROS,   Endo/Other  negative endocrine ROS  Renal/GU  CKD per history - normal creatinine, patient denies kidney problems      Musculoskeletal  (+) Arthritis ,   Abdominal   Peds  Hematology negative hematology ROS (+)   Anesthesia Other Findings Covid test negative   Reproductive/Obstetrics                            Anesthesia Physical Anesthesia Plan  ASA:  4  Anesthesia Plan: General   Post-op Pain Management:    Induction: Intravenous  PONV Risk Score and Plan: 3 and Treatment may vary due to age or medical condition  Airway Management Planned: Oral ETT  Additional Equipment: Arterial line, CVP, PA Cath, TEE and Ultrasound Guidance Line Placement  Intra-op Plan:   Post-operative Plan: Post-operative intubation/ventilation  Informed Consent: I have reviewed the patients History and Physical, chart, labs and discussed the procedure including the risks, benefits and alternatives for the proposed anesthesia with the patient or authorized representative who has indicated his/her understanding and acceptance.     Dental advisory given  Plan Discussed with: CRNA and Anesthesiologist  Anesthesia Plan Comments:        Anesthesia Quick Evaluation

## 2021-03-24 NOTE — Anesthesia Procedure Notes (Signed)
Central Venous Catheter Insertion Performed by: Beryle Lathe, MD, anesthesiologist Start/End7/04/2021 12:18 PM, 03/24/2021 12:19 PM Patient location: Pre-op. Preanesthetic checklist: patient identified, IV checked, risks and benefits discussed, surgical consent, monitors and equipment checked, pre-op evaluation, timeout performed and anesthesia consent Position: Trendelenburg Hand hygiene performed  and maximum sterile barriers used  Total catheter length 10. PA cath was placed.Swan type:thermodilution PA Cath depth:45 Procedure performed without using ultrasound guided technique. Attempts: 1 Patient tolerated the procedure well with no immediate complications.

## 2021-03-24 NOTE — Discharge Instructions (Addendum)
Discharge Instructions:  1. You may shower, please wash incisions daily with soap and water and keep dry.  If you wish to cover wounds with dressing you may do so but please keep clean and change daily.  No tub baths or swimming until incisions have completely healed.  If your incisions become red or develop any drainage please call our office at [phone]  2. No Driving until cleared by Dr. Atkins office and you are no longer using narcotic pain medications  3. Monitor your weight daily. Please use the same scale and weigh at same time... If you gain 5-10 lbs in 48 hours with associated lower extremity swelling, please contact our office at [phone]  4. Fever of 101.5 for at least 24 hours with no source, please contact our office at [phone]  5. Activity- up as tolerated, please walk at least 3 times per day.  Avoid strenuous activity, no lifting, pushing, or pulling with your arms over 8-10 lbs for a minimum of 6 weeks  6. If any questions or concerns arise, please do not hesitate to contact our office at [phone]    Information on my medicine - ELIQUIS (apixaban)  Why was Eliquis prescribed for you? Eliquis was prescribed for you to reduce the risk of a blood clot forming that can cause a stroke if you have a medical condition called atrial fibrillation (a type of irregular heartbeat).  What do You need to know about Eliquis ? Take your Eliquis TWICE DAILY - one tablet in the morning and one tablet in the evening with or without food. If you have difficulty swallowing the tablet whole please discuss with your pharmacist how to take the medication safely.  Take Eliquis exactly as prescribed by your doctor and DO NOT stop taking Eliquis without talking to the doctor who prescribed the medication.  Stopping may increase your risk of developing a stroke.  Refill your prescription before you run out.  After discharge, you should have regular check-up appointments with  your healthcare provider that is prescribing your Eliquis.  In the future your dose may need to be changed if your kidney function or weight changes by a significant amount or as you get older.  What do you do if you miss a dose? If you miss a dose, take it as soon as you remember on the same day and resume taking twice daily.  Do not take more than one dose of ELIQUIS at the same time to make up a missed dose.  Important Safety Information A possible side effect of Eliquis is bleeding. You should call your healthcare provider right away if you experience any of the following: ? Bleeding from an injury or your nose that does not stop. ? Unusual colored urine (red or dark brown) or unusual colored stools (red or black). ? Unusual bruising for unknown reasons. ? A serious fall or if you hit your head (even if there is no bleeding).  Some medicines may interact with Eliquis and might increase your risk of bleeding or clotting while on Eliquis. To help avoid this, consult your healthcare provider or pharmacist prior to using any new prescription or non-prescription medications, including herbals, vitamins, non-steroidal anti-inflammatory drugs (NSAIDs) and supplements.  This website has more information on Eliquis (apixaban): http://www.eliquis.com/eliquis/home 

## 2021-03-24 NOTE — Anesthesia Procedure Notes (Signed)
Procedure Name: Intubation Date/Time: 03/24/2021 1:20 PM Performed by: Tillman Abide, CRNA Pre-anesthesia Checklist: Patient identified, Emergency Drugs available, Suction available and Patient being monitored Patient Re-evaluated:Patient Re-evaluated prior to induction Oxygen Delivery Method: Circle System Utilized Preoxygenation: Pre-oxygenation with 100% oxygen Induction Type: IV induction Ventilation: Mask ventilation without difficulty Laryngoscope Size: Miller and 2 Grade View: Grade I Tube type: Oral Tube size: 7.0 mm Number of attempts: 1 Airway Equipment and Method: Stylet and Oral airway Placement Confirmation: ETT inserted through vocal cords under direct vision, positive ETCO2 and breath sounds checked- equal and bilateral Secured at: 22 cm Tube secured with: Tape Dental Injury: Teeth and Oropharynx as per pre-operative assessment

## 2021-03-24 NOTE — Hospital Course (Addendum)
HPI: [name] is an 80 year old female with history of dyslipidemia and chronic stage III kidney disease.  She has no prior cardiac history.  She presented to the [facility] ED on 03/21/2021 after an episode of chest discomfort radiating to her arms that lasted about 5 minutes.  She had been having similar episodes postprandially lasting less than a minute for a few months.  She was evaluated by cardiology in May of this year with a similar complaint of chest discomfort and shortness of breath.  The chest pain was felt to be originating from her chest wall and the shortness of breath was thought to possibly be of allergic reaction triggered by her cat. In the emergency room, EKG showed nonspecific ST-T wave changes.  Troponin was 87 and later rose to 90.  She was noted to be hypertensive in the emergency room although she had no prior history of hypertension.  She was admitted to the hospital and started on aspirin along with low-dose amlodipine.  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  The left ventricular ejection fraction was estimated at 60 to 65%.  There was no significant mitral insufficiency, no evidence of mitral stenosis, and no valve insufficiency.  On the day after admission, she had a Myoview nuclear stress test that showed a large area of ischemia.  Following the Myoview, she had chest pain once again and high-sensitivity troponin was again checked and had risen to above 3800.  She was started on heparin and nitroglycerin infusions.  The cardiology team proceeded with left heart catheterization in the evening on 03/22/2021.  This demonstrated a proximal 90% stenosis in the LAD before the first diagonal and a long area of 99% stenosis in the LAD after the second diagonal.  Additionally, there was an 80% mid stenosis in the RCA and a proximal 80% stenosis in the posterior descending coronary artery.  Dr. Vickey Sages discussed  the need for coronary artery bypass grafting surgery. Potential risks, benefits, and complications of the surgery were discussed with the patient and she agreed to proceed with surgery. Pre operative carotid duplex US showed no significant internal carotid artery stenosis bilaterally. She underwent a CABG x 3.  Hospital Course: She was transferred from the OR to [facility] ICU in stable condition. She was extubated after midnight without difficulty. She remained afebrile and hemodynamically stable. The patient was extubated the evening of surgery without difficulty. He/she remained afebrile and hemodynamically stable. Kimberly Calhoun, a line, chest tubes, and foley were removed early in the post operative course. She was started on an Amiodarone drip. Lopressor was started and titrated accordingly. She was volume over loaded and diuresed. Of note, she has CKD (stage III) and her creatinine post op remained stable. Her last creatinine was stable at 0.84. She had ABL anemia. She did not require a post op transfusion. Last H and H was stable at 10.1 and 30.3.  She was weaned off the insulin drip. The patient's glucose remained well controlled.The patient's HGA1C pre op was 5.8.  The patient was felt surgically stable for transfer from the ICU to [facility] for further convalescence on 07/10 .  She continues to progress with cardiac rehab. She was ambulating on room air. She had nausea on post op day 3 and was given Reglan. Nausea did resolve. She has been tolerating a diet and has had a bowel movement. Epicardial pacing wires were removed on 07/11.  Because of PAF,  ec asa was decreased to 81 mg and she was started on Apixaban (as discussed with Dr. Vickey Sages). Also, Lopressor was increased to 37.5 mg bid for better rate control. Chest tube sutures will be removed the day of discharge. The patient is felt surgically stable for discharge today.

## 2021-03-24 NOTE — Anesthesia Postprocedure Evaluation (Signed)
Anesthesia Post Note  Patient: Kimberly Calhoun  Procedure(s) Performed: CORONARY ARTERY BYPASS GRAFTING (CABG) TIMES THREE USING LEFT INTERNAL MAMMARY ARTERY AND RIGHT GREATER SAPHENOUS VEIN HARVESTED ENDOSCOPICALLY (Chest) TRANSESOPHAGEAL ECHOCARDIOGRAM (TEE)     Patient location during evaluation: ICU Anesthesia Type: General Level of consciousness: sedated and patient remains intubated per anesthesia plan Pain management: pain level controlled Vital Signs Assessment: post-procedure vital signs reviewed and stable Respiratory status: patient remains intubated per anesthesia plan Cardiovascular status: stable Postop Assessment: no apparent nausea or vomiting Anesthetic complications: no   No notable events documented.  Last Vitals:  Vitals:   03/24/21 1136 03/24/21 1706  BP: (!) 171/67 114/64  Pulse: 99 73  Resp: 17 14  Temp: 36.8 C   SpO2: 98% 100%    Last Pain:  Vitals:   03/24/21 1136  TempSrc: Oral  PainSc:                  Beryle Lathe

## 2021-03-24 NOTE — Progress Notes (Signed)
[facility] BRIEF SICU PROGRESS NOTE  Day of Surgery  S/P Procedure(s) (LRB): CORONARY ARTERY BYPASS GRAFTING (CABG) TIMES THREE USING LEFT INTERNAL MAMMARY ARTERY AND RIGHT GREATER SAPHENOUS VEIN HARVESTED ENDOSCOPICALLY (N/A) TRANSESOPHAGEAL ECHOCARDIOGRAM (TEE) (N/A)   Sedated on vent NSR w/ stable hemodynamics O2 sats 100% on 50% FiO2 Chest tube output low UOP adequate Labs pending  Plan: Continue routine early postop  Purcell Nails, MD 03/24/2021 5:33 PM

## 2021-03-24 NOTE — Progress Notes (Signed)
Progress Note  Patient Name: Kimberly Calhoun Date of Encounter: 03/24/2021  [facility] Cardiologist: Lance Muss, MD   Subjective   Doing well this morning. No chest pain or SOB. Anxious about her procedure today. Daughter at bedside as well.  Seen by CV surgery yesterday; planned for CABG today   Inpatient Medications    Scheduled Meds:  amLODipine  5 mg Oral Daily   aspirin  81 mg Oral Daily   Chlorhexidine Gluconate Cloth  6 each Topical Q0600   epinephrine  0-10 mcg/min Intravenous To OR   ezetimibe  10 mg Oral Daily   heparin-papaverine-plasmalyte irrigation   Irrigation To OR   insulin   Intravenous To OR   loratadine  10 mg Oral Daily   magnesium sulfate  40 mEq Other To OR   metoprolol succinate  25 mg Oral Daily   mupirocin ointment  1 application Nasal BID   phenylephrine  30-200 mcg/min Intravenous To OR   potassium chloride  80 mEq Other To OR   sodium chloride flush  3 mL Intravenous Q12H   sodium chloride flush  3 mL Intravenous Q12H   tranexamic acid  15 mg/kg Intravenous To OR   tranexamic acid  2 mg/kg Intracatheter To OR   Continuous Infusions:  sodium chloride     sodium chloride 100 mL/hr at 03/23/21 0330   sodium chloride      ceFAZolin (ANCEF) IV      ceFAZolin (ANCEF) IV     dexmedetomidine     heparin 30,000 units/NS 1000 mL solution for CELLSAVER     heparin 1,050 Units/hr (03/24/21 0538)   milrinone     nitroGLYCERIN Stopped (03/23/21 0430)   nitroGLYCERIN     norepinephrine     tranexamic acid (CYKLOKAPRON) infusion ([facility])     vancomycin     PRN Meds: sodium chloride, sodium chloride, acetaminophen, alum & mag hydroxide-simeth, nitroGLYCERIN, nitroGLYCERIN, ondansetron (ZOFRAN) IV, sodium chloride flush, sodium chloride flush   Vital Signs    Vitals:   03/23/21 1641 03/23/21 1930 03/23/21 2332 03/24/21 0538  BP: (!) 143/79 (!) 151/81 (!) 151/75 (!) 152/76  Pulse: (!) 101 (!) 106 91 95  Resp: Temp: 98.2 F  (36.8 C) 98.3 F (36.8 C) 98 F (36.7 C) 97.8 F (36.6 C)  TempSrc: Oral Oral Oral Oral  SpO2: 91% 93% 97% 97%  Weight:    61.8 kg  Height:        Intake/Output Summary (Last 24 hours) at 03/24/2021 0647 Last data filed at 03/24/2021 0549 Gross per 24 hour  Intake 1407.31 ml  Output 1 ml  Net 1406.31 ml    Last 3 Weights 03/24/2021 03/23/2021 03/22/2021  Weight (lbs) 136 lb 3.9 oz 136 lb 3.9 oz 142 lb 10.2 oz  Weight (kg) 61.8 kg 61.8 kg 64.7 kg      Telemetry    NSR- Personally Reviewed  ECG    No new tracing- Personally Reviewed  Physical Exam   GEN: No acute distress.   Neck: No JVD Cardiac: RRR, 2/6 systolic murmur. No rubs or gallops.  Respiratory: Clear to auscultation bilaterally. GI: Soft, nontender, non-distended  MS: No edema; No deformity. Neuro:  Nonfocal  Psych: Normal affect   Labs    High Sensitivity Troponin:   Recent Labs  Lab 03/21/21 1006 03/21/21 1221 03/22/21 1652 03/22/21 1811  TROPONINIHS 87* 90* 3,248* 3,895*       Chemistry Recent Labs  Lab  03/21/21 1006 03/22/21 0149 03/23/21 0031  NA 138 137 131*  K 3.8 4.4 4.2  CL 102 103 100  CO2 26 27 25   GLUCOSE 95 92 120*  BUN 21 14 18   CREATININE 0.93 0.86 0.89  CALCIUM 9.7 9.4 8.7*  GFRNONAA >60 >60 >60  ANIONGAP 10 7 6       Hematology Recent Labs  Lab 03/21/21 1006 03/22/21 0149 03/23/21 0031  WBC 16.1* 10.6* 11.5*  RBC 4.49 4.31 4.17  HGB 13.2 12.9 12.2  HCT 39.9 38.8 37.2  MCV 88.9 90.0 89.2  MCH 29.4 29.9 29.3  MCHC 33.1 33.2 32.8  RDW 14.3 14.1 14.0  PLT 402* 374 372     BNP Recent Labs  Lab 03/21/21 1006  BNP 252.7*      DDimer No results for input(s): DDIMER in the last 168 hours.   Radiology    CT CHEST WO CONTRAST  Result Date: 03/23/2021 CLINICAL DATA:  Thoracic aortic Disease. Preoperative planning for coronary bypass. EXAM: CT CHEST WITHOUT CONTRAST TECHNIQUE: Multidetector CT imaging of the chest was performed following the standard protocol  without IV contrast. COMPARISON:  None. FINDINGS: Cardiovascular: Normal heart size. No pericardial effusions. Coronary artery and mild aortic calcification. Normal caliber thoracic aorta. Mediastinum/Nodes: Thyroid gland is unremarkable. Esophagus is decompressed. Scattered lymph nodes in the mediastinum are not pathologically enlarged, likely reactive. Small esophageal hiatal hernia. Lungs/Pleura: There is a focal area of patchy tree-in-bud infiltration in a peribronchial distribution in the right middle lung and less prominently in the left lingula and left upper lung. These changes may represent early bronchopneumonia or sequela of airways disease. Airways are patent. Ovoid nodule along the right minor fissure measuring 0.5 x 0.9 cm. This likely represents an inter fissural lymph node. No pleural effusions. No pneumothorax. Upper Abdomen: No acute abnormalities demonstrated in the visualized upper abdomen. Musculoskeletal: Degenerative changes in the spine. No destructive bone lesions. IMPRESSION: 1. Patchy areas of peribronchial tree-in-bud nodular infiltrates bilaterally but most prominent in the right middle lung. These may represent early multifocal pneumonia or airways disease. 2. Right mid lung nodule along the minor fissure likely represents an inter fissural lymph node. 3. Small esophageal hiatal hernia. Electronically Signed   By: Burman NievesWilliam  Stevens M.D.   On: 03/23/2021 19:41   CARDIAC CATHETERIZATION  Result Date: 03/22/2021  Mid LM to Dist LM lesion is 40% stenosed.  Prox LAD lesion is 90% stenosed.  Mid LAD to Dist LAD lesion is 99% stenosed.  Prox RCA lesion is 50% stenosed.  Dist RCA lesion is 30% stenosed.  Prox RCA to Mid RCA lesion is 80% stenosed.  RPDA lesion is 80% stenosed.  Dist LAD-1 lesion is 99% stenosed.  Dist LAD-2 lesion is 90% stenosed.  Prox LAD to Mid LAD lesion is 40% stenosed with 40% stenosed side branch in 1st Diag.  The left ventricular ejection fraction is  greater than 65% by visual estimate.  Severe multivessel CAD with significant calcification in the proximal LAD with 40% smooth proximal stenosis followed by 90% stenosis with irregularity in the region of the first diagonal takeoff.  The LAD is subtotally occluded after a very tortuous large second diagonal vessel. 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 Large dominant RCA with 30% proximal stenosis followed by 50 and 80% eccentric mid stenoses with 30% stenosis before the PDA takeoff and 80% stenosis in the PDA.  The distal RCA supplies 4 large branches which  extend to the LV apex. 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 RECOMMENDATION: Angiograms will be reviewed with colleagues.  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  The Myoview study suggests significant viability to the LAD territory.  Consider surgical consultation for CABG revascularization.   NM Myocar Multi W/Spect W/Wall Motion / EF  Result Date: 03/22/2021 CLINICAL DATA:  Chest pain. History of macular degeneration and chronic kidney disease, stage III A. EXAM: MYOCARDIAL IMAGING WITH SPECT (REST AND PHARMACOLOGIC-STRESS) GATED LEFT VENTRICULAR WALL MOTION STUDY LEFT VENTRICULAR EJECTION FRACTION TECHNIQUE: Standard myocardial SPECT imaging was performed after resting intravenous injection of 10 mCi Tc-57m tetrofosmin. Subsequently, intravenous infusion of Lexiscan was performed under the supervision of the Cardiology staff. At peak effect of the drug, 32.3 mCi Tc-74m tetrofosmin was injected intravenously and standard myocardial SPECT imaging was performed. Quantitative gated imaging was also performed to evaluate left ventricular wall  motion, and estimate left ventricular ejection fraction. COMPARISON:  Chest radiograph 03/21/2021. FINDINGS: Perfusion: Study is mildly degraded by prominent subdiaphragmatic activity. There is a large reversible perfusion defect involving the left ventricular apex, distal segments of all walls and the mid septum. There is incomplete reversibility in the distal septum. Summed difference score is 17. Wall Motion: Diffusely abnormal wall motion with apical hypokinesis and paradoxical motion in the septum. Left Ventricular Ejection Fraction: 59 % End diastolic volume 57 ml End systolic volume 24 ml IMPRESSION: 1. Large area of reversible ischemia involving most of the septum, distal segments of all walls and the left ventricular apex as described. 2. Abnormal left ventricular wall motion with apical hypokinesis and paradoxical motion in the septum. 3. Left ventricular ejection fraction 59% 4. Non invasive risk stratification*: High *2012 Appropriate Use Criteria for Coronary Revascularization Focused Update: J Am Coll Cardiol. 2012;59(9):857-881. http://content.dementiazones.com.aspx?articleid=1201161 These results were discussed by telephone at the time of interpretation on 03/22/2021 at 3:07 pm with provider Theodore Demark, PA-C, who verbally acknowledged these results. Electronically Signed   By: Carey Bullocks M.D.   On: 03/22/2021 15:12   ECHOCARDIOGRAM COMPLETE  Result Date: 03/22/2021    ECHOCARDIOGRAM REPORT   Patient Name:   HETHER ANSELMO Date of Exam: 03/22/2021 Medical Rec #:  [id number]      Height:       63.0 in Accession #:    [id number]     Weight:       142.6 lb Date of Birth:  [birth year]      BSA:          1.675 m Patient Age:    79 years       BP:           188/85 mmHg Patient Gender: F              HR:           105 bpm. Exam Location:  Inpatient Procedure: 2D Echo Indications:    chest pain  History:        Patient has no prior history of Echocardiogram examinations.  Sonographer:    Delcie Roch Referring Phys: [id number] Beatriz Stallion  Sonographer Comments: Image acquisition challenging due to respiratory motion. IMPRESSIONS  1. LVOT gradient 43 mmHg with Valsalva. Left ventricular ejection fraction, by estimation, is 60 to 65%. The left ventricle has normal function.  The left ventricle demonstrates regional wall motion abnormalities (see scoring diagram/findings for description). There is moderate asymmetric left ventricular hypertrophy of the basal-septal segment. Left ventricular diastolic parameters are indeterminate. There is hypokinesis of the left ventricular, basal-mid anteroseptal wall and inferoseptal wall.  2. Right ventricular systolic function is normal. The right ventricular size is normal.  3. The mitral valve is degenerative. Mild mitral valve regurgitation. No evidence of mitral stenosis.  4. The aortic valve was not well visualized. Aortic valve regurgitation is not visualized.  5. The inferior vena cava is normal in size with greater than 50% respiratory variability, suggesting right atrial pressure of 3 mmHg. Comparison(s): No prior Echocardiogram. Conclusion(s)/Recommendation(s): Focal hypokinesis at basal septum with preserved EF. FINDINGS  Left Ventricle: LVOT gradient 43 mmHg with Valsalva. Left ventricular ejection fraction, by estimation, is 60 to 65%. The left ventricle has normal function. The left ventricle demonstrates regional wall motion abnormalities. The left ventricular internal cavity size was normal in size. There is moderate asymmetric left ventricular hypertrophy of the basal-septal segment. Left ventricular diastolic parameters are indeterminate. Right Ventricle: The right ventricular size is normal. No increase in right ventricular wall thickness. Right ventricular systolic function is normal. Left Atrium: Left atrial size was normal in size. Right Atrium: Right atrial size was normal in size. Pericardium: There is no evidence of pericardial effusion.  Presence of pericardial fat pad. Mitral Valve: The mitral valve is degenerative in appearance. There is moderate thickening of the mitral valve leaflet(s). There is moderate calcification of the mitral valve leaflet(s). Mild to moderate mitral annular calcification. Mild mitral valve regurgitation. No evidence of mitral valve stenosis. Tricuspid Valve: The tricuspid valve is grossly normal. Tricuspid valve regurgitation is trivial. No evidence of tricuspid stenosis. Aortic Valve: The aortic valve was not well visualized. Aortic valve regurgitation is not visualized. Pulmonic Valve: The pulmonic valve was not well visualized. Pulmonic valve regurgitation is not visualized. Aorta: The aortic root, ascending aorta, aortic arch and descending aorta are all structurally normal, with no evidence of dilitation or obstruction. Venous: The inferior vena cava is normal in size with greater than 50% respiratory variability, suggesting right atrial pressure of 3 mmHg. IAS/Shunts: The interatrial septum was not well visualized.  LEFT VENTRICLE PLAX 2D LVIDd:         3.90 cm  Diastology LVIDs:         2.40 cm  LV e' medial:    6.96 cm/s LV PW:         0.80 cm  LV E/e' medial:  10.6 LV IVS:        0.80 cm  LV e' lateral:   7.72 cm/s LVOT diam:     1.80 cm  LV E/e' lateral: 9.5 LVOT Area:     2.54 cm  RIGHT VENTRICLE RV S prime:     16.80 cm/s TAPSE (M-mode): 1.7 cm LEFT ATRIUM             Index       RIGHT ATRIUM          Index LA diam:        3.10 cm 1.85 cm/m  RA Area:     8.14 cm LA Vol (A2C):   26.6 ml 15.88 ml/m RA Volume:   14.70 ml 8.78 ml/m LA Vol (A4C):   27.4 ml 16.36 ml/m LA Biplane Vol: 28.7 ml 17.14 ml/m   AORTA Ao Root diam: 3.00 cm Ao Asc diam:  2.70 cm MV E velocity: 73.70 cm/s  MV A velocity: 147.00 cm/s  SHUNTS MV E/A ratio:  0.50         Systemic Diam: 1.80 cm Jodelle Red MD Electronically signed by Jodelle Red MD Signature Date/Time: 03/22/2021/6:38:34 PM    Final    VAS US DOPPLER PRE  CABG  Result Date: 03/23/2021 PREOPERATIVE VASCULAR EVALUATION Patient Name:  ERRICKA FALKNER  Date of Exam:   03/23/2021 Medical Rec #: [id number]       Accession #:    [id number] Date of Birth: 21-Apr-1941       Patient Gender: F Patient Age:   51Y Exam Location:  [facility] Procedure:      VAS US DOPPLER PRE CABG Referring Phys: [id number] BROADUS Z ATKINS --------------------------------------------------------------------------------  Indications: Pre-CABG. Performing Technologist: Jean Rosenthal RDMS,RVT  Examination Guidelines: A complete evaluation includes B-mode imaging, spectral Doppler, color Doppler, and power Doppler as needed of all accessible portions of each vessel. Bilateral testing is considered an integral part of a complete examination. Limited examinations for reoccurring indications may be performed as noted.  Right Carotid Findings: +----------+--------+--------+--------+----------------------------+--------+           PSV cm/sEDV cm/sStenosisDescribe                    Comments +----------+--------+--------+--------+----------------------------+--------+ CCA Prox  104     15                                                   +----------+--------+--------+--------+----------------------------+--------+ CCA Distal83      18                                                   +----------+--------+--------+--------+----------------------------+--------+ ICA Prox  64      17      1-39%   hyperechoic and heterogenous         +----------+--------+--------+--------+----------------------------+--------+ ICA Distal94      34                                                   +----------+--------+--------+--------+----------------------------+--------+ ECA       147     15                                                   +----------+--------+--------+--------+----------------------------+--------+ Portions of this table do not appear on this page.  +----------+--------+-------+----------------+------------+           PSV cm/sEDV cmsDescribe        Arm Pressure +----------+--------+-------+----------------+------------+ [id number]            Multiphasic, WNL             +----------+--------+-------+----------------+------------+ +---------+--------+--+--------+--+---------+ VertebralPSV cm/s69EDV cm/s20Antegrade +---------+--------+--+--------+--+---------+ Left Carotid Findings: +----------+--------+--------+--------+----------------------------+--------+           PSV cm/sEDV cm/sStenosisDescribe                    Comments +----------+--------+--------+--------+----------------------------+--------+ CCA Prox  109     19                                                   +----------+--------+--------+--------+----------------------------+--------+ CCA Distal75      16              heterogenous                         +----------+--------+--------+--------+----------------------------+--------+ ICA Prox  58      18      1-39%   hyperechoic and heterogenous         +----------+--------+--------+--------+----------------------------+--------+ ICA Distal84      27                                                   +----------+--------+--------+--------+----------------------------+--------+ ECA       140     12                                                   +----------+--------+--------+--------+----------------------------+--------+  +----------+--------+--------+----------------+------------+ SubclavianPSV cm/sEDV cm/sDescribe        Arm Pressure +----------+--------+--------+----------------+------------+           178             Multiphasic, WNL             +----------+--------+--------+----------------+------------+ +---------+--------+--+--------+--+---------+ VertebralPSV cm/s62EDV cm/s17Antegrade +---------+--------+--+--------+--+---------+  ABI Findings:  +--------+------------------+-----+---------+--------+ Right   Rt Pressure (mmHg)IndexWaveform Comment  +--------+------------------+-----+---------+--------+ Brachial                       triphasic         +--------+------------------+-----+---------+--------+ PTA                            triphasic         +--------+------------------+-----+---------+--------+ DP                             biphasic          +--------+------------------+-----+---------+--------+ +--------+------------------+-----+---------+-------+ Left    Lt Pressure (mmHg)IndexWaveform Comment +--------+------------------+-----+---------+-------+ Brachial                       triphasic        +--------+------------------+-----+---------+-------+ PTA                            triphasic        +--------+------------------+-----+---------+-------+ DP                             biphasic         +--------+------------------+-----+---------+-------+  Right Doppler Findings: +--------+--------+-----+---------+--------+ Site    PressureIndexDoppler  Comments +--------+--------+-----+---------+--------+ Brachial             triphasic         +--------+--------+-----+---------+--------+ Radial  triphasic         +--------+--------+-----+---------+--------+ Ulnar                triphasic         +--------+--------+-----+---------+--------+  Left Doppler Findings: +--------+--------+-----+---------+--------+ Site    PressureIndexDoppler  Comments +--------+--------+-----+---------+--------+ Brachial             triphasic         +--------+--------+-----+---------+--------+ Radial               triphasic         +--------+--------+-----+---------+--------+ Ulnar                triphasic         +--------+--------+-----+---------+--------+  Summary: Right Carotid: Velocities in the right ICA are consistent with a 1-39% stenosis. Left Carotid:  Velocities in the left ICA are consistent with a 1-39% stenosis. Vertebrals:  Bilateral vertebral arteries demonstrate antegrade flow. Subclavians: Normal flow hemodynamics were seen in bilateral subclavian              arteries. Right Upper Extremity: Doppler waveforms remain within normal limits with right radial compression. Doppler waveforms remain within normal limits with right ulnar compression. Left Upper Extremity: Doppler waveforms remain within normal limits with left radial compression. Doppler waveforms remain within normal limits with left ulnar compression.     Preliminary     Cardiac Studies  LHC 03/22/21: Mid LM to Dist LM lesion is 40% stenosed. Prox LAD lesion is 90% stenosed. Mid LAD to Dist LAD lesion is 99% stenosed. Prox RCA lesion is 50% stenosed. Dist RCA lesion is 30% stenosed. Prox RCA to Mid RCA lesion is 80% stenosed. RPDA lesion is 80% stenosed. Dist LAD-1 lesion is 99% stenosed. Dist LAD-2 lesion is 90% stenosed. Prox LAD to Mid LAD lesion is 40% stenosed with 40% stenosed side branch in 1st Diag. The left ventricular ejection fraction is greater than 65% by visual estimate.   Severe multivessel CAD with significant calcification in the proximal LAD with 40% smooth proximal stenosis followed by 90% stenosis with irregularity in the region of the first diagonal takeoff.  The LAD is subtotally occluded after a very tortuous large second diagonal vessel.   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   Large dominant RCA with 30% proximal stenosis followed by 50 and 80% eccentric mid stenoses with 30% stenosis before the PDA takeoff and 80% stenosis in the PDA.  The distal RCA supplies 4 large branches which extend to the LV apex.   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   RECOMMENDATION: Angiograms will be  reviewed with colleagues.  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  The Myoview study suggests significant viability to the LAD territory.  Consider surgical consultation for CABG revascularization. Diagnostic Dominance: Right    Intervention      Myoview 03/22/21: IMPRESSION: 1. Large area of reversible ischemia involving most of the septum, distal segments of all walls and the left ventricular apex as described.   2. Abnormal left ventricular wall motion with apical hypokinesis and paradoxical motion in the septum.   3. Left ventricular ejection fraction 59%   4. Non invasive risk stratification*: High  TTE 03/22/21: IMPRESSIONS   1. LVOT gradient 43 mmHg with Valsalva. Left ventricular ejection  fraction, by estimation, is 60 to 65%. The left ventricle has normal  function. The left ventricle demonstrates regional wall motion  abnormalities (see scoring diagram/findings for  description). There is moderate asymmetric left ventricular hypertrophy of  the basal-septal segment. Left ventricular diastolic parameters are  indeterminate. There is hypokinesis of the left ventricular, basal-mid  anteroseptal wall and inferoseptal wall.   2. Right ventricular systolic function is normal. The right ventricular  size is normal.   3. The mitral valve is degenerative. Mild mitral valve regurgitation. No  evidence of mitral stenosis.   4. The aortic valve was not well visualized. Aortic valve regurgitation  is not visualized.   5. The inferior vena cava is normal in size with greater than 50%  respiratory variability, suggesting right atrial pressure of 3 mmHg.   Comparison(s): No prior Echocardiogram.   Conclusion(s)/Recommendation(s): Focal hypokinesis at basal  septum with  preserved EF.                        Patient Profile     80 y.o. female with history of HLD, macular degeneration, and CKD stage 3a, who presents with progressive worsening dyspnea on exertion and chest pain found to have mildly elevated BNP and torponin prompting admission to Cardiology for TTE and ischemic work-up.  Assessment & Plan    #Severe multivessel CAD: #Chest pain:  Patient presented with intermittent chest pain predominately post-prandial (lunch/dinner) lasting for 1 minutes with a more prolonged episode lasting 5 minutes 03/20/21 prompting ED evaluation. Also notes progressive dyspnea on exertion and mild chest discomfort with "a lot of exertion." EKG with non-specific ST-T wave changes. HsTrop on admission mildly elevated with flat trend. Underwent myoview on 7/6 as detailed above found to have large reversible ischemia in the septum, all distal walls and apex. Post-myoview course complicated by refractory chest pain despite nitro gtt and rising troponin prompting LHC. Cath revealed severe multivessel CAD. 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 May be related to HTN and age related changes vs HOCM. No SAM and LVEF preserved. May be contributing to dyspnea on exertion as well as anginal symptoms as detailed above. -Continue metoprolol  XL daily and up-titrate as tolerated -Maintain good hydration; not on diuretics -Manage  multivessel CAD as above   #HTN: New diagnosis. Now improved -Continue amlodipine  daily -Continue metop  XL daily as above -Likely transition from amlodipine to ACE/ARB post-intervention    #HLD:  LDL 180, TC 270, TG 140, HDL 62. Refusing statins. Will need PCKS9i as out-patient -Intolerant to statins and patient refuses to take them -Continue zetia  daily -Will need PCSK9i as out-patient  #Abnormal CXR:  CXR showed volume loss and architectural distortion in the RML c/f chronic post infectious or inflammatory scarring. Recommended for repeat standing PA/lateral in 1-2 months. - Will need repeat CXR in 1-2 months    For questions or updates,  please contact [facility] Please consult [email or web address] for contact info under        Signed, Meriam Sprague, MD  03/24/2021, 6:47 AM

## 2021-03-24 NOTE — Anesthesia Procedure Notes (Signed)
Arterial Line Insertion Start/End7/04/2021 12:30 PM, 03/24/2021 12:40 PM Performed by: Samara Deist, CRNA, CRNA  Preanesthetic checklist: patient identified, IV checked, site marked, risks and benefits discussed, surgical consent, monitors and equipment checked, pre-op evaluation, timeout performed and anesthesia consent Left, radial was placed Catheter size: 20 G Hand hygiene performed , maximum sterile barriers used  and Seldinger technique used Allen's test indicative of satisfactory collateral circulation Attempts: 1 Procedure performed without using ultrasound guided technique. Following insertion, dressing applied and Biopatch. Post procedure assessment: normal and unchanged  Patient tolerated the procedure well with no immediate complications.

## 2021-03-24 NOTE — Brief Op Note (Addendum)
03/21/2021 - 03/24/2021  3:23 PM  PATIENT:  Kimberly Calhoun  80 y.o. female  PRE-OPERATIVE DIAGNOSIS:  CORONARY ARTERY DISEASE  POST-OPERATIVE DIAGNOSIS:  CORONARY ARTERY DISEASE  PROCEDURE: TRANSESOPHAGEAL ECHOCARDIOGRAM (TEE) CORONARY ARTERY BYPASS GRAFTING (CABG) TIMES THREE (LIMA to LAD, SVG to DIAGONAL, SVG to PDA) USING LEFT INTERNAL MAMMARY ARTERY AND RIGHT GREATER SAPHENOUS VEIN HARVESTED ENDOSCOPICALLY, Completion graft surveillance with indocyanine green fluorescence angiography EVH HARVEST TIME: 20 minutes;EVH PREP TIME: 14 minutes  SURGEON:  Surgeon(s) and Role:    Linden Dolin, MD - Primary  PHYSICIAN ASSISTANT: Doree Fudge PA-C  ASSISTANTS: Dorthey Sawyer RNFA   ANESTHESIA:   general  EBL:  Per anesthesia, perfusion record  DRAINS:  Chest tubes placed in the mediastinal and pleural spaces    COUNTS CORRECT:  YES  DICTATION: .Dragon Dictation  PLAN OF CARE: Admit to inpatient   PATIENT DISPOSITION:  ICU - intubated and hemodynamically stable.   Delay start of Pharmacological VTE agent (>24hrs) due to surgical blood loss or risk of bleeding: yes  BASELINE WEIGHT: 61.8 kg

## 2021-03-25 ENCOUNTER — Inpatient Hospital Stay (HOSPITAL_COMMUNITY): Payer: Medicare Other

## 2021-03-25 DIAGNOSIS — I422 Other hypertrophic cardiomyopathy: Secondary | ICD-10-CM

## 2021-03-25 DIAGNOSIS — Z951 Presence of aortocoronary bypass graft: Secondary | ICD-10-CM

## 2021-03-25 LAB — TYPE AND SCREEN
ABO/RH(D): B POS
Antibody Screen: NEGATIVE
Unit division: 0
Unit division: 0

## 2021-03-25 LAB — CBC
HCT: 28.7 % — ABNORMAL LOW (ref 36.0–46.0)
HCT: 30.5 % — ABNORMAL LOW (ref 36.0–46.0)
Hemoglobin: 9.9 g/dL — ABNORMAL LOW (ref 12.0–15.0)
Hemoglobin: 10.3 g/dL — ABNORMAL LOW (ref 12.0–15.0)
MCH: 30.2 pg (ref 26.0–34.0)
MCH: 29.9 pg (ref 26.0–34.0)
MCHC: 34.5 g/dL (ref 30.0–36.0)
MCHC: 33.8 g/dL (ref 30.0–36.0)
MCV: 87.5 fL (ref 80.0–100.0)
MCV: 88.4 fL (ref 80.0–100.0)
Platelets: 167 10*3/uL (ref 150–400)
Platelets: 195 10*3/uL (ref 150–400)
RBC: 3.28 MIL/uL — ABNORMAL LOW (ref 3.87–5.11)
RBC: 3.45 MIL/uL — ABNORMAL LOW (ref 3.87–5.11)
RDW: 15.6 % — ABNORMAL HIGH (ref 11.5–15.5)
RDW: 15.9 % — ABNORMAL HIGH (ref 11.5–15.5)
WBC: 11.7 10*3/uL — ABNORMAL HIGH (ref 4.0–10.5)
WBC: 15 10*3/uL — ABNORMAL HIGH (ref 4.0–10.5)
nRBC: 0 % (ref 0.0–0.2)
nRBC: 0 % (ref 0.0–0.2)

## 2021-03-25 LAB — BASIC METABOLIC PANEL
Anion gap: 8 (ref 5–15)
Anion gap: 6 (ref 5–15)
BUN: 13 mg/dL (ref 8–23)
BUN: 15 mg/dL (ref 8–23)
CO2: 20 mmol/L — ABNORMAL LOW (ref 22–32)
CO2: 21 mmol/L — ABNORMAL LOW (ref 22–32)
Calcium: 7.8 mg/dL — ABNORMAL LOW (ref 8.9–10.3)
Calcium: 8.1 mg/dL — ABNORMAL LOW (ref 8.9–10.3)
Chloride: 105 mmol/L (ref 98–111)
Chloride: 103 mmol/L (ref 98–111)
Creatinine, Ser: 0.83 mg/dL (ref 0.44–1.00)
Creatinine, Ser: 0.93 mg/dL (ref 0.44–1.00)
GFR, Estimated: >60 mL/min (ref >60)
GFR, Estimated: >60 mL/min (ref >60)
Glucose, Bld: 149 mg/dL — ABNORMAL HIGH (ref 70–99)
Glucose, Bld: 163 mg/dL — ABNORMAL HIGH (ref 70–99)
Potassium: 3.7 mmol/L (ref 3.5–5.1)
Potassium: 3.6 mmol/L (ref 3.5–5.1)
Sodium: 133 mmol/L — ABNORMAL LOW (ref 135–145)
Sodium: 130 mmol/L — ABNORMAL LOW (ref 135–145)

## 2021-03-25 LAB — POCT I-STAT 7, (LYTES, BLD GAS, ICA,H+H)
Acid-base deficit: 3 mmol/L — ABNORMAL HIGH (ref 0.0–2.0)
Acid-base deficit: 4 mmol/L — ABNORMAL HIGH (ref 0.0–2.0)
Bicarbonate: 20.2 mmol/L (ref 20.0–28.0)
Bicarbonate: 22 mmol/L (ref 20.0–28.0)
Calcium, Ion: 1.11 mmol/L — ABNORMAL LOW (ref 1.15–1.40)
Calcium, Ion: 1.13 mmol/L — ABNORMAL LOW (ref 1.15–1.40)
HCT: 22 % — ABNORMAL LOW (ref 36.0–46.0)
HCT: 32 % — ABNORMAL LOW (ref 36.0–46.0)
Hemoglobin: 10.9 g/dL — ABNORMAL LOW (ref 12.0–15.0)
Hemoglobin: 7.5 g/dL — ABNORMAL LOW (ref 12.0–15.0)
O2 Saturation: 98 %
O2 Saturation: 99 %
Patient temperature: 37.5
Patient temperature: 37.5
Potassium: 4.3 mmol/L (ref 3.5–5.1)
Potassium: 4.3 mmol/L (ref 3.5–5.1)
Sodium: 137 mmol/L (ref 135–145)
Sodium: 137 mmol/L (ref 135–145)
TCO2: 21 mmol/L — ABNORMAL LOW (ref 22–32)
TCO2: 23 mmol/L (ref 22–32)
pCO2 arterial: 33.6 mmHg (ref 32.0–48.0)
pCO2 arterial: 38.8 mmHg (ref 32.0–48.0)
pH, Arterial: 7.364 (ref 7.350–7.450)
pH, Arterial: 7.389 (ref 7.350–7.450)
pO2, Arterial: 107 mmHg (ref 83.0–108.0)
pO2, Arterial: 142 mmHg — ABNORMAL HIGH (ref 83.0–108.0)

## 2021-03-25 LAB — BPAM RBC
Blood Product Expiration Date: 202207152359
Blood Product Expiration Date: 202208022359
ISSUE DATE / TIME: 202207081434
ISSUE DATE / TIME: 202207081434
Unit Type and Rh: 7300
Unit Type and Rh: 7300

## 2021-03-25 LAB — MAGNESIUM
Magnesium: 2.6 mg/dL — ABNORMAL HIGH (ref 1.7–2.4)
Magnesium: 2.3 mg/dL (ref 1.7–2.4)

## 2021-03-25 LAB — GLUCOSE, CAPILLARY
Glucose-Capillary: 116 mg/dL — ABNORMAL HIGH (ref 70–99)
Glucose-Capillary: 133 mg/dL — ABNORMAL HIGH (ref 70–99)
Glucose-Capillary: 135 mg/dL — ABNORMAL HIGH (ref 70–99)
Glucose-Capillary: 137 mg/dL — ABNORMAL HIGH (ref 70–99)
Glucose-Capillary: 137 mg/dL — ABNORMAL HIGH (ref 70–99)
Glucose-Capillary: 154 mg/dL — ABNORMAL HIGH (ref 70–99)
Glucose-Capillary: 164 mg/dL — ABNORMAL HIGH (ref 70–99)
Glucose-Capillary: 166 mg/dL — ABNORMAL HIGH (ref 70–99)

## 2021-03-25 MED ORDER — POTASSIUM CHLORIDE CRYS ER 20 MEQ PO TBCR
20.0000 meq | EXTENDED_RELEASE_TABLET | ORAL | Status: DC
  Administered 2021-03-25: 20 meq via ORAL
  Filled 2021-03-25: qty 1

## 2021-03-25 MED ORDER — FUROSEMIDE 10 MG/ML IJ SOLN
20.0000 mg | Freq: Two times a day (BID) | INTRAMUSCULAR | Status: DC
  Administered 2021-03-25 – 2021-03-26 (×2): 20 mg via INTRAVENOUS
  Filled 2021-03-25 (×2): qty 2

## 2021-03-25 MED ORDER — INSULIN ASPART 100 UNIT/ML IJ SOLN
0.0000 [IU] | INTRAMUSCULAR | Status: DC
  Administered 2021-03-25: 2 [IU] via SUBCUTANEOUS
  Administered 2021-03-25 (×2): 4 [IU] via SUBCUTANEOUS
  Administered 2021-03-25: 2 [IU] via SUBCUTANEOUS

## 2021-03-25 MED ORDER — POTASSIUM CHLORIDE 10 MEQ/50ML IV SOLN
10.0000 meq | INTRAVENOUS | Status: AC
  Administered 2021-03-25 (×3): 10 meq via INTRAVENOUS
  Filled 2021-03-25: qty 50

## 2021-03-25 MED ORDER — ENOXAPARIN SODIUM 30 MG/0.3ML IJ SOSY
30.0000 mg | PREFILLED_SYRINGE | Freq: Every day | INTRAMUSCULAR | Status: DC
  Administered 2021-03-25 – 2021-03-27 (×3): 30 mg via SUBCUTANEOUS
  Filled 2021-03-25 (×3): qty 0.3

## 2021-03-25 MED ORDER — LEVALBUTEROL HCL 0.63 MG/3ML IN NEBU: 0.6300 mg | INHALATION_SOLUTION | Freq: Four times a day (QID) | RESPIRATORY_TRACT | Status: DC | PRN

## 2021-03-25 NOTE — Progress Notes (Signed)
Progress Note  Patient Name: Kimberly Calhoun Date of Encounter: 03/25/2021  [facility] Cardiologist: Lance Muss, MD   Subjective   Recovering well from CT Surgery 03/24/21 without issues.  Has some sternal pain but is improved.  Had PACs overnight; on amiodarone for suppression presently A pacing.  Inpatient Medications    Scheduled Meds:  acetaminophen  1,000 mg Oral Q6H   Or   acetaminophen (TYLENOL) oral liquid 160 mg/5 mL  1,000 mg Per Tube Q6H   aspirin EC  325 mg Oral Daily   Or   aspirin  324 mg Per Tube Daily   bisacodyl  10 mg Oral Daily   Or   bisacodyl  10 mg Rectal Daily   Chlorhexidine Gluconate Cloth  6 each Topical Q0600   colchicine  0.3 mg Oral BID   docusate sodium  200 mg Oral Daily   ezetimibe  10 mg Oral Daily   levalbuterol  0.63 mg Nebulization Q6H   loratadine  10 mg Oral Daily   metoprolol tartrate  12.5 mg Oral BID   Or   metoprolol tartrate  12.5 mg Per Tube BID   mupirocin ointment  1 application Nasal BID   [START ON 03/26/2021] pantoprazole  40 mg Oral Daily   potassium chloride  20 mEq Oral Q4H   sodium chloride flush  3 mL Intravenous Q12H   Continuous Infusions:  sodium chloride 20 mL/hr at 03/25/21 0900   sodium chloride     sodium chloride 20 mL/hr at 03/24/21 1700   amiodarone 30 mg/hr (03/25/21 0900)    ceFAZolin (ANCEF) IV Stopped (03/25/21 0645)   dexmedetomidine (PRECEDEX) IV infusion Stopped (03/24/21 1945)   electrolyte-A Stopped (03/25/21 0744)   insulin 0.5 Units/hr (03/25/21 0900)   lactated ringers     lactated ringers     lactated ringers 20 mL/hr at 03/25/21 0900   nitroGLYCERIN 0 mcg/min (03/24/21 1700)   phenylephrine (NEO-SYNEPHRINE) Adult infusion Stopped (03/25/21 0113)   PRN Meds: sodium chloride, dextrose, lactated ringers, metoprolol tartrate, midazolam, morphine injection, ondansetron (ZOFRAN) IV, oxyCODONE, sodium chloride flush, traMADol   Vital Signs    Vitals:   03/25/21 0730 03/25/21 0745  03/25/21 0800 03/25/21 0914  BP:   (!) 101/58 (!) 126/45  Pulse: 69 69 70 70  Resp: 18 17 (!) 22   Temp: 98.96 F (37.2 C) 98.78 F (37.1 C) 98.96 F (37.2 C)   TempSrc:   Core   SpO2: 98% 99% 97%   Weight:      Height:        Intake/Output Summary (Last 24 hours) at 03/25/2021 0938 Last data filed at 03/25/2021 0900 Gross per 24 hour  Intake 7380.23 ml  Output 2940 ml  Net 4440.23 ml   Last 3 Weights 03/25/2021 03/24/2021 03/23/2021  Weight (lbs) 149 lb 0.5 oz 136 lb 3.9 oz 136 lb 3.9 oz  Weight (kg) 67.6 kg 61.8 kg 61.8 kg      Telemetry    A pacing- SR with PACs seen (during AF)- Personally Reviewed  ECG    SR PM 03/24/21- Personally Reviewed  Physical Exam   GEN: No acute distress.   Neck: No JVD Cardiac: RRR, 2/6 systolic murmur. No rubs or gallops.  Respiratory: Clear to auscultation bilaterally. GI: Soft, nontender, non-distended  MS: No edema; No deformity. Neuro:  Nonfocal  Psych: Normal affect   Labs    High Sensitivity Troponin:   Recent Labs  Lab 03/21/21 1006 03/21/21 1221 03/22/21  1652 03/22/21 1811  TROPONINIHS 87* 90* 3,248* 3,895*      Chemistry Recent Labs  Lab 03/23/21 0031 03/24/21 0851 03/24/21 1329 03/24/21 1515 03/24/21 1547 03/24/21 1550 03/25/21 0018 03/25/21 0115 03/25/21 0625  NA 131* 139   < > 134* 136   < > 137 137 133*  K 4.2 5.0   < > 5.1 4.6   < > 4.3 4.3 3.7  CL 100 109   < > 103 102  --   --   --  105  CO2 25 27  --   --   --   --   --   --  20*  GLUCOSE 120* 102*   < > 127* 122*  --   --   --  149*  BUN 18 14   < > 11 11  --   --   --  13  CREATININE 0.89 0.82   < > 0.50 0.40*  --   --   --  0.83  CALCIUM 8.7* 9.5  --   --   --   --   --   --  7.8*  GFRNONAA >60 >60  --   --   --   --   --   --  >60  ANIONGAP 6 3*  --   --   --   --   --   --  8   < > = values in this interval not displayed.     Hematology Recent Labs  Lab 03/24/21 [id number] 03/24/21 1329 03/24/21 1508 03/24/21 1515 03/24/21 1711 03/24/21 1718  03/25/21 0018 03/25/21 0115 03/25/21 0625  WBC 11.3*  --   --   --  16.1*  --   --   --  11.7*  RBC 4.34  --   --   --  3.65*  --   --   --  3.28*  HGB 12.9   < > 7.8*   < > 11.0*   < > 7.5* 10.9* 9.9*  HCT 39.0   < > 22.9*   < > 32.5*   < > 22.0* 32.0* 28.7*  MCV 89.9  --   --   --  89.0  --   --   --  87.5  MCH 29.7  --   --   --  30.1  --   --   --  30.2  MCHC 33.1  --   --   --  33.8  --   --   --  34.5  RDW 14.0  --   --   --  14.7  --   --   --  15.6*  PLT 345  --  230  --  181  --   --   --  167   < > = values in this interval not displayed.    BNP Recent Labs  Lab 03/21/21 1006  BNP 252.7*     DDimer No results for input(s): DDIMER in the last 168 hours.   Radiology    CT CHEST WO CONTRAST  Result Date: 03/23/2021 CLINICAL DATA:  Thoracic aortic Disease. Preoperative planning for coronary bypass. EXAM: CT CHEST WITHOUT CONTRAST TECHNIQUE: Multidetector CT imaging of the chest was performed following the standard protocol without IV contrast. COMPARISON:  None. FINDINGS: Cardiovascular: Normal heart size. No pericardial effusions. Coronary artery and mild aortic calcification. Normal caliber thoracic aorta. Mediastinum/Nodes: Thyroid gland is unremarkable. Esophagus is decompressed. Scattered lymph nodes in the  mediastinum are not pathologically enlarged, likely reactive. Small esophageal hiatal hernia. Lungs/Pleura: There is a focal area of patchy tree-in-bud infiltration in a peribronchial distribution in the right middle lung and less prominently in the left lingula and left upper lung. These changes may represent early bronchopneumonia or sequela of airways disease. Airways are patent. Ovoid nodule along the right minor fissure measuring 0.5 x 0.9 cm. This likely represents an inter fissural lymph node. No pleural effusions. No pneumothorax. Upper Abdomen: No acute abnormalities demonstrated in the visualized upper abdomen. Musculoskeletal: Degenerative changes in the spine. No  destructive bone lesions. IMPRESSION: 1. Patchy areas of peribronchial tree-in-bud nodular infiltrates bilaterally but most prominent in the right middle lung. These may represent early multifocal pneumonia or airways disease. 2. Right mid lung nodule along the minor fissure likely represents an inter fissural lymph node. 3. Small esophageal hiatal hernia. Electronically Signed   By: Burman Nieves M.D.   On: 03/23/2021 19:41   DG Chest Port 1 View  Result Date: 03/25/2021 CLINICAL DATA:  Encounter for pneumothorax.  Status post CABG. EXAM: PORTABLE CHEST 1 VIEW COMPARISON:  Chest x-rays dated 03/24/2021 and 03/21/2021. FINDINGS: LEFT-sided chest tube is stable in position. Probable mild bibasilar atelectasis and/or small pleural effusions. Lungs otherwise clear. No pneumothorax is seen. Mediastinal drain and Swan-Ganz catheter are stable in position. Endotracheal tube and enteric tube have been removed. IMPRESSION: Probable mild bibasilar atelectasis and/or small pleural effusions. No pneumothorax seen. LEFT-sided chest tube is stable in position. Electronically Signed   By: Bary Richard M.D.   On: 03/25/2021 08:44   DG Chest Port 1 View  Result Date: 03/24/2021 CLINICAL DATA:  Patient status post CABG today. EXAM: PORTABLE CHEST 1 VIEW COMPARISON:  PA and lateral chest 03/21/2021 FINDINGS: Endotracheal tube is in good position with the tip at the level of the clavicular heads. Right IJ approach Swan-Ganz catheter tip is in the mid to distal right main pulmonary artery. Mediastinal drain and left chest tube noted. There is bibasilar atelectasis. No pneumothorax pulmonary edema. Heart size normal. Aortic atherosclerosis. IMPRESSION: Support tubes and lines projecting good position. Negative for pneumothorax or acute disease. Electronically Signed   By: Drusilla Kanner M.D.   On: 03/24/2021 18:39   ECHO INTRAOPERATIVE TEE  Result Date: 03/24/2021  *INTRAOPERATIVE TRANSESOPHAGEAL REPORT *  Patient Name:    Kimberly Calhoun Date of Exam: 03/24/2021 Medical Rec #:  [id number]      Height:       63.0 in Accession #:    [id number]     Weight:       136.2 lb Date of Birth:  03-Apr-1941      BSA:          1.64 m Patient Age:    79 years       BP:           153/82 mmHg Patient Gender: F              HR:           92 bpm. Exam Location:  Anesthesiology Transesophogeal exam was perform intraoperatively during surgical procedure. Patient was closely monitored under general anesthesia during the entirety of examination. Indications:     Coronary Artery Disease Performing Phys: [id number] TGPQDIY Z ATKINS Diagnosing Phys: Leslye Peer MD Complications: No known complications during this procedure. POST-OP IMPRESSIONS - Left Ventricle: The left ventricle is unchanged from pre-bypass. - Right Ventricle: The right ventricle appears unchanged from pre-bypass. - Aorta: The  aorta appears unchanged from pre-bypass. - Left Atrium: The left atrium appears unchanged from pre-bypass. - Left Atrial Appendage: The left atrial appendage appears unchanged from pre-bypass. - Aortic Valve: The aortic valve appears unchanged from pre-bypass. - Mitral Valve: The mitral valve appears unchanged from pre-bypass. - Tricuspid Valve: The tricuspid valve appears unchanged from pre-bypass. - Pulmonic Valve: The pulmonic valve appears unchanged from pre-bypass. - Interatrial Septum: The interatrial septum appears unchanged from pre-bypass. - Interventricular Septum: The interventricular septum appears unchanged from pre-bypass. - Pericardium: The pericardium appears unchanged from pre-bypass. PRE-OP FINDINGS  Left Ventricle: The left ventricle has normal systolic function, with an ejection fraction of 60-65%. The cavity size was normal. There is asymmetric left ventricular hypertrophy of the basal-septal segment. Left ventricular diastolic function could not  be evaluated.  LV Wall Scoring: Hypokinesis of the antero- and inferoseptal wall, basal segments.  Right  Ventricle: The right ventricle has normal systolic function. The cavity was normal. There is no increase in right ventricular wall thickness. Left Atrium: Left atrial size was normal in size. No left atrial/left atrial appendage thrombus was detected. Right Atrium: Right atrial size was normal in size. Interatrial Septum: No atrial level shunt detected by color flow Doppler. Pericardium: There is no evidence of pericardial effusion. Mitral Valve: The mitral valve is normal in structure. Mitral valve regurgitation mild to moderate. The MR jet is posteriorly-directed. Tricuspid Valve: The tricuspid valve was normal in structure. Tricuspid valve regurgitation is trivial by color flow Doppler. Aortic Valve: The aortic valve is tricuspid Aortic valve regurgitation was not visualized by color flow Doppler. Pulmonic Valve: The pulmonic valve was not assessed. Pulmonic valve regurgitation was not assessed by color flow Doppler.  Leslye Peer MD Electronically signed by Leslye Peer MD Signature Date/Time: 03/24/2021/4:54:52 PM    Final    VAS US DOPPLER PRE CABG  Result Date: 03/23/2021 PREOPERATIVE VASCULAR EVALUATION Patient Name:  Kimberly Calhoun  Date of Exam:   03/23/2021 Medical Rec #: [id number]       Accession #:    [id number] Date of Birth: 1941/04/10       Patient Gender: F Patient Age:   39Y Exam Location:  [facility] Procedure:      VAS US DOPPLER PRE CABG Referring Phys: [id number] BROADUS Z ATKINS --------------------------------------------------------------------------------  Indications: Pre-CABG. Performing Technologist: Jean Rosenthal RDMS,RVT  Examination Guidelines: A complete evaluation includes B-mode imaging, spectral Doppler, color Doppler, and power Doppler as needed of all accessible portions of each vessel. Bilateral testing is considered an integral part of a complete examination. Limited examinations for reoccurring indications may be performed as noted.  Right Carotid Findings:  +----------+--------+--------+--------+----------------------------+--------+           PSV cm/sEDV cm/sStenosisDescribe                    Comments +----------+--------+--------+--------+----------------------------+--------+ CCA Prox  104     15                                                   +----------+--------+--------+--------+----------------------------+--------+ CCA Distal83      18                                                   +----------+--------+--------+--------+----------------------------+--------+  ICA Prox  64      17      1-39%   hyperechoic and heterogenous         +----------+--------+--------+--------+----------------------------+--------+ ICA Distal94      34                                                   +----------+--------+--------+--------+----------------------------+--------+ ECA       147     15                                                   +----------+--------+--------+--------+----------------------------+--------+ Portions of this table do not appear on this page. +----------+--------+-------+----------------+------------+           PSV cm/sEDV cmsDescribe        Arm Pressure +----------+--------+-------+----------------+------------+ [id number]            Multiphasic, WNL             +----------+--------+-------+----------------+------------+ +---------+--------+--+--------+--+---------+ VertebralPSV cm/s69EDV cm/s20Antegrade +---------+--------+--+--------+--+---------+ Left Carotid Findings: +----------+--------+--------+--------+----------------------------+--------+           PSV cm/sEDV cm/sStenosisDescribe                    Comments +----------+--------+--------+--------+----------------------------+--------+ CCA Prox  109     19                                                   +----------+--------+--------+--------+----------------------------+--------+ CCA Distal75      16               heterogenous                         +----------+--------+--------+--------+----------------------------+--------+ ICA Prox  58      18      1-39%   hyperechoic and heterogenous         +----------+--------+--------+--------+----------------------------+--------+ ICA Distal84      27                                                   +----------+--------+--------+--------+----------------------------+--------+ ECA       140     12                                                   +----------+--------+--------+--------+----------------------------+--------+  +----------+--------+--------+----------------+------------+ SubclavianPSV cm/sEDV cm/sDescribe        Arm Pressure +----------+--------+--------+----------------+------------+           178             Multiphasic, WNL             +----------+--------+--------+----------------+------------+ +---------+--------+--+--------+--+---------+ VertebralPSV cm/s62EDV cm/s17Antegrade +---------+--------+--+--------+--+---------+  ABI Findings: +--------+------------------+-----+---------+--------+ Right   Rt Pressure (mmHg)IndexWaveform Comment  +--------+------------------+-----+---------+--------+ Brachial  triphasic         +--------+------------------+-----+---------+--------+ PTA                            triphasic         +--------+------------------+-----+---------+--------+ DP                             biphasic          +--------+------------------+-----+---------+--------+ +--------+------------------+-----+---------+-------+ Left    Lt Pressure (mmHg)IndexWaveform Comment +--------+------------------+-----+---------+-------+ Brachial                       triphasic        +--------+------------------+-----+---------+-------+ PTA                            triphasic        +--------+------------------+-----+---------+-------+ DP                              biphasic         +--------+------------------+-----+---------+-------+  Right Doppler Findings: +--------+--------+-----+---------+--------+ Site    PressureIndexDoppler  Comments +--------+--------+-----+---------+--------+ Brachial             triphasic         +--------+--------+-----+---------+--------+ Radial               triphasic         +--------+--------+-----+---------+--------+ Ulnar                triphasic         +--------+--------+-----+---------+--------+  Left Doppler Findings: +--------+--------+-----+---------+--------+ Site    PressureIndexDoppler  Comments +--------+--------+-----+---------+--------+ Brachial             triphasic         +--------+--------+-----+---------+--------+ Radial               triphasic         +--------+--------+-----+---------+--------+ Ulnar                triphasic         +--------+--------+-----+---------+--------+  Summary: Right Carotid: Velocities in the right ICA are consistent with a 1-39% stenosis. Left Carotid: Velocities in the left ICA are consistent with a 1-39% stenosis. Vertebrals:  Bilateral vertebral arteries demonstrate antegrade flow. Subclavians: Normal flow hemodynamics were seen in bilateral subclavian              arteries. Right Upper Extremity: Doppler waveforms remain within normal limits with right radial compression. Doppler waveforms remain within normal limits with right ulnar compression. Left Upper Extremity: Doppler waveforms remain within normal limits with left radial compression. Doppler waveforms remain within normal limits with left ulnar compression.     Preliminary     Cardiac Studies  LHC 03/22/21: Mid LM to Dist LM lesion is 40% stenosed. Prox LAD lesion is 90% stenosed. Mid LAD to Dist LAD lesion is 99% stenosed. Prox RCA lesion is 50% stenosed. Dist RCA lesion is 30% stenosed. Prox RCA to Mid RCA lesion is 80% stenosed. RPDA lesion is 80% stenosed. Dist  LAD-1 lesion is 99% stenosed. Dist LAD-2 lesion is 90% stenosed. Prox LAD to Mid LAD lesion is 40% stenosed with 40% stenosed side branch in 1st Diag. The left ventricular ejection fraction is greater than 65% by visual estimate.   Severe multivessel CAD with significant calcification  in the proximal LAD with 40% smooth proximal stenosis followed by 90% stenosis with irregularity in the region of the first diagonal takeoff.  The LAD is subtotally occluded after a very tortuous large second diagonal vessel.   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   Large dominant RCA with 30% proximal stenosis followed by 50 and 80% eccentric mid stenoses with 30% stenosis before the PDA takeoff and 80% stenosis in the PDA.  The distal RCA supplies 4 large branches which extend to the LV apex.   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   RECOMMENDATION: Angiograms will be reviewed with colleagues.  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  The Myoview study suggests significant viability to the LAD territory.  Consider surgical consultation for CABG revascularization. Diagnostic Dominance: Right    Intervention      Myoview 03/22/21: IMPRESSION: 1. Large area of reversible ischemia involving most of the septum, distal segments of all walls and the left ventricular apex as described.   2. Abnormal left ventricular wall motion with apical hypokinesis and paradoxical motion in the septum.   3. Left ventricular ejection fraction 59%   4. Non invasive risk stratification*: High                                                                      TTE 03/22/21: IMPRESSIONS   1. LVOT gradient 43  mmHg with Valsalva. Left ventricular ejection  fraction, by estimation, is 60 to 65%. The left ventricle has normal  function. The left ventricle demonstrates regional wall motion  abnormalities (see scoring diagram/findings for  description). There is moderate asymmetric left ventricular hypertrophy of  the basal-septal segment. Left ventricular diastolic parameters are  indeterminate. There is hypokinesis of the left ventricular, basal-mid  anteroseptal wall and inferoseptal wall.   2. Right ventricular systolic function is normal. The right ventricular  size is normal.   3. The mitral valve is degenerative. Mild mitral valve regurgitation. No  evidence of mitral stenosis.   4. The aortic valve was not well visualized. Aortic valve regurgitation  is not visualized.   5. The inferior vena cava is normal in size with greater than 50%  respiratory variability, suggesting right atrial pressure of 3 mmHg.   Comparison(s): No prior Echocardiogram.   Conclusion(s)/Recommendation(s): Focal hypokinesis at basal septum with  preserved EF.                        Patient Profile     80 y.o. female with history of HLD, macular degeneration, and CKD stage 3a, who presents with progressive worsening dyspnea on exertion and chest pain found to have mildly elevated BNP and torponin prompting admission to Cardiology for TTE and ischemic work-up.  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 without history of long standing hypertension (does have hx of HTN) HLD #LVOT Gradient on TTE: - continue ASA - -Adamently refusing statin; will  need PCSK9i as out-patient -Continue zetia  daily -Colchicine reasonable post operatively - continue BB - continue PRN nitro -Maintain good hydration; caution with diuretics - Will need repeat CXR in 1-2 months - Echo reviewed:  in the A3c view there dose appear to be basal septal hypertrophy of 15 mm  with early SAM and with associated late peaking MR by color Flow Doppler; patient has not history of long standing HTN that would account for prominent septal hypertrophy; though on imaging this is consistent with basal septal hypertrophy - would repeat echo at discharge follow up; low threshold to uptitrate BB and transition BB to verapamil or diltiazem - no significant AF at this time; amiodarone is presently suppressive in nature and be able to converted to PO quickly; given HOCM concerns AF may not be well tolerated  Discussed at length with patient and daughter      For questions or updates, please contact [facility] Please consult [email or web address] for contact info under        Signed, Christell Constant, MD  03/25/2021, 9:38 AM

## 2021-03-25 NOTE — Progress Notes (Signed)
[address]       [name] [address]             [phone]        CARDIOTHORACIC SURGERY PROGRESS NOTE   R1 Day Post-Op Procedure(s) (LRB): CORONARY ARTERY BYPASS GRAFTING (CABG) TIMES THREE USING LEFT INTERNAL MAMMARY ARTERY AND RIGHT GREATER SAPHENOUS VEIN HARVESTED ENDOSCOPICALLY (N/A) TRANSESOPHAGEAL ECHOCARDIOGRAM (TEE) (N/A)  Subjective: Looks good.  Mild soreness in chest.  No SOB.  No nausea  Objective: Vital signs: BP Readings from Last 1 Encounters:  03/25/21 (!) 126/45   Pulse Readings from Last 1 Encounters:  03/25/21 70   Resp Readings from Last 1 Encounters:  03/25/21 (!) 22   Temp Readings from Last 1 Encounters:  03/25/21 98.96 F (37.2 C) (Core)    Hemodynamics: PAP: (19-36)/(6-23) 28/12 CO:  [2.4 L/min-3.2 L/min] 3 L/min CI:  [1.4 L/min/m2-1.9 L/min/m2] 1.8 L/min/m2  Physical Exam:  Rhythm:   sinus  Breath sounds: clear  Heart sounds:  RRR  Incisions:  Dressing dry intact  Abdomen:  Soft, non-distended, non-tender  Extremities:  Warm, well-perfused  Chest tubes:  low volume thin serosanguinous output, no air leak   Intake/Output from previous day: 07/08 0701 - 07/09 0700 In: 7211.8 [P.O.:120; I.V.:4012.6; Blood:780; NG/GT:60; IV Piggyback:2239.3] Out: 2886 [Urine:2136; Blood:270; Chest Tube:480] Intake/Output this shift: Total I/O In: 225.5 [I.V.:225.5] Out: 140 [Urine:80; Chest Tube:60]  Lab Results:  CBC: Recent Labs    03/24/21 1711 03/24/21 1718 03/25/21 0115 03/25/21 0625  WBC 16.1*  --   --  11.7*  HGB 11.0*   < > 10.9* 9.9*  HCT 32.5*   < > 32.0* 28.7*  PLT 181  --   --  167   < > = values in this interval not displayed.    BMET:  Recent Labs    03/24/21 0851 03/24/21 1329 03/24/21 1547 03/24/21 1550 03/25/21 0115 03/25/21 0625  NA 139   < > 136   < > 137 133*  K 5.0   < > 4.6   < > 4.3 3.7  CL 109   < > 102  --   --  105  CO2 27  --   --   --   --  20*  GLUCOSE 102*   < > 122*  --    --  149*  BUN 14   < > 11  --   --  13  CREATININE 0.82   < > 0.40*  --   --  0.83  CALCIUM 9.5  --   --   --   --  7.8*   < > = values in this interval not displayed.     PT/INR:   Recent Labs    03/24/21 1711  LABPROT 16.4*  INR 1.3*    CBG (last 3)  Recent Labs    03/25/21 0205 03/25/21 0306 03/25/21 0403  GLUCAP 133* 137* 116*    ABG    Component Value Date/Time   PHART 7.364 03/25/2021 0115   PCO2ART 38.8 03/25/2021 0115   PO2ART 107 03/25/2021 0115   HCO3 22.0 03/25/2021 0115   TCO2 23 03/25/2021 0115   ACIDBASEDEF 3.0 (H) 03/25/2021 0115   O2SAT 98.0 03/25/2021 0115    CXR: PORTABLE CHEST 1 VIEW   COMPARISON:  Chest x-rays dated 03/24/2021 and 03/21/2021.   FINDINGS: LEFT-sided chest tube is stable in position. Probable mild bibasilar atelectasis and/or small pleural effusions. Lungs otherwise clear.  No pneumothorax is seen.   Mediastinal drain and Swan-Ganz catheter are stable in position. Endotracheal tube and enteric tube have been removed.   IMPRESSION: Probable mild bibasilar atelectasis and/or small pleural effusions. No pneumothorax seen. LEFT-sided chest tube is stable in position.     Electronically Signed   By: Bary Richard M.D.   On: 03/25/2021 08:44    Assessment/Plan: S/P Procedure(s) (LRB): CORONARY ARTERY BYPASS GRAFTING (CABG) TIMES THREE USING LEFT INTERNAL MAMMARY ARTERY AND RIGHT GREATER SAPHENOUS VEIN HARVESTED ENDOSCOPICALLY (N/A) TRANSESOPHAGEAL ECHOCARDIOGRAM (TEE) (N/A)  Doing well POD1 Mobilize D/C lines Gentle diuresis Routine care  Purcell Nails, MD 03/25/2021 10:40 AM

## 2021-03-25 NOTE — Progress Notes (Signed)
NIF -20 Vt 0.8 lts. Pt extubated to 4 lt Elliott. IS 250 x 5 with good effort. Pt sats 100% HR 80. Will monotor

## 2021-03-25 NOTE — Procedures (Signed)
Extubation Procedure Note  Patient Details:   Name: Kimberly Calhoun DOB: 09-16-1941 MRN: [id number]   Airway Documentation:  Airway 7 mm (Active)  Secured at (cm) 22 cm 03/24/21 2000  Measured From Lips 03/24/21 2000  Secured Location Center 03/24/21 2000  Secured By Pink Tape 03/24/21 2000  Cuff Pressure (cm H2O) Clear OR 27-39 CmH2O 03/24/21 2000  Site Condition Dry 03/24/21 2000   Vent end date: 03/25/21 Vent end time: 0023   Evaluation  O2 sats: stable throughout Complications: No apparent complications Patient did tolerate procedure well. Bilateral Breath Sounds: Clear   Yes  Rushie Chestnut [facility] 03/25/2021, 12:24 AM

## 2021-03-26 ENCOUNTER — Inpatient Hospital Stay (HOSPITAL_COMMUNITY): Payer: Medicare Other

## 2021-03-26 LAB — CBC
HCT: 29.5 % — ABNORMAL LOW (ref 36.0–46.0)
Hemoglobin: 9.9 g/dL — ABNORMAL LOW (ref 12.0–15.0)
MCH: 30 pg (ref 26.0–34.0)
MCHC: 33.6 g/dL (ref 30.0–36.0)
MCV: 89.4 fL (ref 80.0–100.0)
Platelets: 197 10*3/uL (ref 150–400)
RBC: 3.3 MIL/uL — ABNORMAL LOW (ref 3.87–5.11)
RDW: 15.9 % — ABNORMAL HIGH (ref 11.5–15.5)
WBC: 14.6 10*3/uL — ABNORMAL HIGH (ref 4.0–10.5)
nRBC: 0 % (ref 0.0–0.2)

## 2021-03-26 LAB — BASIC METABOLIC PANEL
Anion gap: 6 (ref 5–15)
BUN: 17 mg/dL (ref 8–23)
CO2: 23 mmol/L (ref 22–32)
Calcium: 8.2 mg/dL — ABNORMAL LOW (ref 8.9–10.3)
Chloride: 99 mmol/L (ref 98–111)
Creatinine, Ser: 0.85 mg/dL (ref 0.44–1.00)
GFR, Estimated: >60 mL/min (ref >60)
Glucose, Bld: 130 mg/dL — ABNORMAL HIGH (ref 70–99)
Potassium: 3.9 mmol/L (ref 3.5–5.1)
Sodium: 128 mmol/L — ABNORMAL LOW (ref 135–145)

## 2021-03-26 LAB — GLUCOSE, CAPILLARY
Glucose-Capillary: 83 mg/dL (ref 70–99)
Glucose-Capillary: 126 mg/dL — ABNORMAL HIGH (ref 70–99)
Glucose-Capillary: 136 mg/dL — ABNORMAL HIGH (ref 70–99)
Glucose-Capillary: 131 mg/dL — ABNORMAL HIGH (ref 70–99)
Glucose-Capillary: 86 mg/dL (ref 70–99)
Glucose-Capillary: 111 mg/dL — ABNORMAL HIGH (ref 70–99)
Glucose-Capillary: 136 mg/dL — ABNORMAL HIGH (ref 70–99)

## 2021-03-26 MED ORDER — ~~LOC~~ CARDIAC SURGERY, PATIENT & FAMILY EDUCATION: Freq: Once | Status: AC

## 2021-03-26 MED ORDER — POTASSIUM CHLORIDE CRYS ER 20 MEQ PO TBCR
20.0000 meq | EXTENDED_RELEASE_TABLET | Freq: Two times a day (BID) | ORAL | Status: DC
  Administered 2021-03-26 (×2): 20 meq via ORAL
  Filled 2021-03-26 (×2): qty 1

## 2021-03-26 MED ORDER — FUROSEMIDE 40 MG PO TABS
40.0000 mg | ORAL_TABLET | Freq: Two times a day (BID) | ORAL | Status: DC
  Administered 2021-03-26 – 2021-03-28 (×5): 40 mg via ORAL
  Filled 2021-03-26 (×5): qty 1

## 2021-03-26 MED ORDER — AMIODARONE HCL 200 MG PO TABS
400.0000 mg | ORAL_TABLET | Freq: Every day | ORAL | Status: DC
  Administered 2021-03-26 – 2021-03-30 (×5): 400 mg via ORAL
  Filled 2021-03-26 (×5): qty 2

## 2021-03-26 NOTE — Plan of Care (Signed)

## 2021-03-26 NOTE — Progress Notes (Signed)
Progress Note  Patient Name: Kimberly Calhoun Date of Encounter: 03/26/2021  [facility] Cardiologist: Lance Muss, MD   Subjective   No events overnight. Notes that that has had worse nausea overnight.  Daughter notes she has been searching Hypertrophic Cardiomyopathy on the internet and has been worried.  Inpatient Medications    Scheduled Meds:  acetaminophen  1,000 mg Oral Q6H   aspirin EC  325 mg Oral Daily   bisacodyl  10 mg Oral Daily   Or   bisacodyl  10 mg Rectal Daily   Chlorhexidine Gluconate Cloth  6 each Topical Q0600   colchicine  0.3 mg Oral BID   docusate sodium  200 mg Oral Daily   enoxaparin (LOVENOX) injection  30 mg Subcutaneous QHS   ezetimibe  10 mg Oral Daily   furosemide  20 mg Intravenous BID   insulin aspart  0-24 Units Subcutaneous Q4H   loratadine  10 mg Oral Daily   metoprolol tartrate  12.5 mg Oral BID   mupirocin ointment  1 application Nasal BID   pantoprazole  40 mg Oral Daily   sodium chloride flush  3 mL Intravenous Q12H   Continuous Infusions:  sodium chloride     amiodarone 30 mg/hr (03/26/21 0757)    ceFAZolin (ANCEF) IV Stopped (03/25/21 2326)   lactated ringers     lactated ringers Stopped (03/25/21 1121)   PRN Meds: levalbuterol, metoprolol tartrate, morphine injection, ondansetron (ZOFRAN) IV, oxyCODONE, sodium chloride flush, traMADol   Vital Signs    Vitals:   03/26/21 0400 03/26/21 0500 03/26/21 0600 03/26/21 0803  BP: 131/68 115/62 127/60   Pulse: 66 65 66   Resp: 18 20 19    Temp: 97.9 F (36.6 C)   97.8 F (36.6 C)  TempSrc: Oral   Oral  SpO2: 100% 100% 100%   Weight:  69.4 kg    Height:        Intake/Output Summary (Last 24 hours) at 03/26/2021 0852 Last data filed at 03/26/2021 0600 Gross per 24 hour  Intake 1079.73 ml  Output 1405 ml  Net -325.27 ml   Last 3 Weights 03/26/2021 03/25/2021 03/24/2021  Weight (lbs) 153 lb 149 lb 0.5 oz 136 lb 3.9 oz  Weight (kg) 69.4 kg 67.6 kg 61.8 kg       Telemetry    SR with PACs; rare A pacing  Personally Reviewed  ECG    No new 03/24/21 - Personally Reviewed  Physical Exam   GEN: No acute distress.   Neck: No JVD Cardiac: RRR, 2/6 systolic murmur. No rubs or gallops.  Respiratory: Clear to auscultation bilaterally. GI: Soft, nontender, non-distended  MS: No edema; No deformity. Neuro:  Nonfocal  Psych: Normal affect   Labs    High Sensitivity Troponin:   Recent Labs  Lab 03/21/21 1006 03/21/21 1221 03/22/21 1652 03/22/21 1811  TROPONINIHS 87* 90* 3,248* 3,895*      Chemistry Recent Labs  Lab 03/25/21 0625 03/25/21 1750 03/26/21 0500  NA 133* 130* 128*  K 3.7 3.6 3.9  CL 105 103 99  CO2 20* 21* 23  GLUCOSE 149* 163* 130*  BUN 13 15 17   CREATININE 0.83 0.93 0.85  CALCIUM 7.8* 8.1* 8.2*  GFRNONAA >60 >60 >60  ANIONGAP 8 6 6      Hematology Recent Labs  Lab 03/25/21 0625 03/25/21 1700 03/26/21 0500  WBC 11.7* 15.0* 14.6*  RBC 3.28* 3.45* 3.30*  HGB 9.9* 10.3* 9.9*  HCT 28.7* 30.5* 29.5*  MCV  87.5 88.4 89.4  MCH 30.2 29.9 30.0  MCHC 34.5 33.8 33.6  RDW 15.6* 15.9* 15.9*  PLT 167 195 197    BNP Recent Labs  Lab 03/21/21 1006  BNP 252.7*     DDimer No results for input(s): DDIMER in the last 168 hours.   Radiology    DG Chest Port 1 View  Result Date: 03/26/2021 CLINICAL DATA:  Status post CABG. EXAM: PORTABLE CHEST 1 VIEW COMPARISON:  Chest x-ray dated 03/25/2021. FINDINGS: Heart size and mediastinal contours are stable. LEFT-sided chest tube is stable in position. RIGHT IJ Cordis remains status post Swan-Ganz catheter removal. Probable mild atelectasis and/or small pleural effusions at each lung base. Lungs otherwise clear. No pneumothorax is seen. IMPRESSION: Probable mild atelectasis and/or small pleural effusions at each lung base. No pneumothorax seen. Electronically Signed   By: Bary RichardStan  Maynard M.D.   On: 03/26/2021 08:48   DG Chest Port 1 View  Result Date: 03/25/2021 CLINICAL DATA:   Encounter for pneumothorax.  Status post CABG. EXAM: PORTABLE CHEST 1 VIEW COMPARISON:  Chest x-rays dated 03/24/2021 and 03/21/2021. FINDINGS: LEFT-sided chest tube is stable in position. Probable mild bibasilar atelectasis and/or small pleural effusions. Lungs otherwise clear. No pneumothorax is seen. Mediastinal drain and Swan-Ganz catheter are stable in position. Endotracheal tube and enteric tube have been removed. IMPRESSION: Probable mild bibasilar atelectasis and/or small pleural effusions. No pneumothorax seen. LEFT-sided chest tube is stable in position. Electronically Signed   By: Bary RichardStan  Maynard M.D.   On: 03/25/2021 08:44   DG Chest Port 1 View  Result Date: 03/24/2021 CLINICAL DATA:  Patient status post CABG today. EXAM: PORTABLE CHEST 1 VIEW COMPARISON:  PA and lateral chest 03/21/2021 FINDINGS: Endotracheal tube is in good position with the tip at the level of the clavicular heads. Right IJ approach Swan-Ganz catheter tip is in the mid to distal right main pulmonary artery. Mediastinal drain and left chest tube noted. There is bibasilar atelectasis. No pneumothorax pulmonary edema. Heart size normal. Aortic atherosclerosis. IMPRESSION: Support tubes and lines projecting good position. Negative for pneumothorax or acute disease. Electronically Signed   By: Drusilla Kannerhomas  Dalessio M.D.   On: 03/24/2021 18:39   ECHO INTRAOPERATIVE TEE  Result Date: 03/24/2021  *INTRAOPERATIVE TRANSESOPHAGEAL REPORT *  Patient Name:   Kimberly Calhoun Date of Exam: 03/24/2021 Medical Rec #:  [id number]      Height:       63.0 in Accession #:    [id number]     Weight:       136.2 lb Date of Birth:  06/02/1941      BSA:          1.64 m Patient Age:    79 years       BP:           153/82 mmHg Patient Gender: F              HR:           92 bpm. Exam Location:  Anesthesiology Transesophogeal exam was perform intraoperatively during surgical procedure. Patient was closely monitored under general anesthesia during the entirety of  examination. Indications:     Coronary Artery Disease Performing Phys: [id number] Z ATKINS Diagnosing Phys: Leslye Peerhomas Brock MD Complications: No known complications during this procedure. POST-OP IMPRESSIONS - Left Ventricle: The left ventricle is unchanged from pre-bypass. - Right Ventricle: The right ventricle appears unchanged from pre-bypass. - Aorta: The aorta appears unchanged from pre-bypass. - Left Atrium: The  left atrium appears unchanged from pre-bypass. - Left Atrial Appendage: The left atrial appendage appears unchanged from pre-bypass. - Aortic Valve: The aortic valve appears unchanged from pre-bypass. - Mitral Valve: The mitral valve appears unchanged from pre-bypass. - Tricuspid Valve: The tricuspid valve appears unchanged from pre-bypass. - Pulmonic Valve: The pulmonic valve appears unchanged from pre-bypass. - Interatrial Septum: The interatrial septum appears unchanged from pre-bypass. - Interventricular Septum: The interventricular septum appears unchanged from pre-bypass. - Pericardium: The pericardium appears unchanged from pre-bypass. PRE-OP FINDINGS  Left Ventricle: The left ventricle has normal systolic function, with an ejection fraction of 60-65%. The cavity size was normal. There is asymmetric left ventricular hypertrophy of the basal-septal segment. Left ventricular diastolic function could not  be evaluated.  LV Wall Scoring: Hypokinesis of the antero- and inferoseptal wall, basal segments.  Right Ventricle: The right ventricle has normal systolic function. The cavity was normal. There is no increase in right ventricular wall thickness. Left Atrium: Left atrial size was normal in size. No left atrial/left atrial appendage thrombus was detected. Right Atrium: Right atrial size was normal in size. Interatrial Septum: No atrial level shunt detected by color flow Doppler. Pericardium: There is no evidence of pericardial effusion. Mitral Valve: The mitral valve is normal in structure.  Mitral valve regurgitation mild to moderate. The MR jet is posteriorly-directed. Tricuspid Valve: The tricuspid valve was normal in structure. Tricuspid valve regurgitation is trivial by color flow Doppler. Aortic Valve: The aortic valve is tricuspid Aortic valve regurgitation was not visualized by color flow Doppler. Pulmonic Valve: The pulmonic valve was not assessed. Pulmonic valve regurgitation was not assessed by color flow Doppler.  Leslye Peer MD Electronically signed by Leslye Peer MD Signature Date/Time: 03/24/2021/4:54:52 PM    Final     Cardiac Studies  LHC 03/22/21: Mid LM to Dist LM lesion is 40% stenosed. Prox LAD lesion is 90% stenosed. Mid LAD to Dist LAD lesion is 99% stenosed. Prox RCA lesion is 50% stenosed. Dist RCA lesion is 30% stenosed. Prox RCA to Mid RCA lesion is 80% stenosed. RPDA lesion is 80% stenosed. Dist LAD-1 lesion is 99% stenosed. Dist LAD-2 lesion is 90% stenosed. Prox LAD to Mid LAD lesion is 40% stenosed with 40% stenosed side branch in 1st Diag. The left ventricular ejection fraction is greater than 65% by visual estimate.   Severe multivessel CAD with significant calcification in the proximal LAD with 40% smooth proximal stenosis followed by 90% stenosis with irregularity in the region of the first diagonal takeoff.  The LAD is subtotally occluded after a very tortuous large second diagonal vessel.   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   Large dominant RCA with 30% proximal stenosis followed by 50 and 80% eccentric mid stenoses with 30% stenosis before the PDA takeoff and 80% stenosis in the PDA.  The distal RCA supplies 4 large branches which extend to the LV apex.   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   RECOMMENDATION: Angiograms will be reviewed with colleagues.  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  The Myoview study suggests significant viability to the LAD territory.  Consider surgical consultation for CABG revascularization. Diagnostic Dominance: Right    Intervention      Myoview 03/22/21:  IMPRESSION: 1. Large area of reversible ischemia involving most of the septum, distal segments of all walls and the left ventricular apex as described.   2. Abnormal left ventricular wall motion with apical hypokinesis and paradoxical motion in the septum.   3. Left ventricular ejection fraction 59%   4. Non invasive risk stratification*: High                                                                      TTE 03/22/21: IMPRESSIONS   1. LVOT gradient 43 mmHg with Valsalva. Left ventricular ejection  fraction, by estimation, is 60 to 65%. The left ventricle has normal  function. The left ventricle demonstrates regional wall motion  abnormalities (see scoring diagram/findings for  description). There is moderate asymmetric left ventricular hypertrophy of  the basal-septal segment. Left ventricular diastolic parameters are  indeterminate. There is hypokinesis of the left ventricular, basal-mid  anteroseptal wall and inferoseptal wall.   2. Right ventricular systolic function is normal. The right ventricular  size is normal.   3. The mitral valve is degenerative. Mild mitral valve regurgitation. No  evidence of mitral stenosis.   4. The aortic valve was not well visualized. Aortic valve regurgitation  is not visualized.   5. The inferior vena cava is normal in size with greater than 50%  respiratory variability, suggesting right atrial pressure of 3 mmHg.   Comparison(s): No prior Echocardiogram.   Conclusion(s)/Recommendation(s): Focal hypokinesis at basal septum with preserved EF.                         Patient Profile     80 y.o. female with history of HLD, macular degeneration, and CKD stage 3a, who presents with progressive worsening dyspnea on exertion and chest pain found to have mildly elevated BNP and torponin prompting admission to Cardiology for TTE and ischemic work-up.  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 without history of long standing hypertension (does have hx of HTN) HLD #LVOT Gradient on TTE: - Continue ASA - Adamantly refusing statin per primary cardiologist; will need PCSK9i as out-patient; we have discussed that zetia is not a statin and patient is now amenable to taking it - Colchicine reasonable post operatively - continue PRN nitro (non needed post) -Maintain good hydration; caution with diuretics as this could increase LVOT gradient and cause dizziness and and LVOTO related symptoms (suspect that this is related to Basal Septal Hypertrophy) - Will need repeat CXR in 1-2 months - starting low dose metoprolol today (held yesterday for BP concerns) - Repeat echo at discharge follow up for LVOT gradient eval - no significant AF at this time-> amiodarone IV being transitioned to PO   Discussed with patient, family, and Dr. Cornelius Moras and nursing team  For questions or updates, please contact [facility] Please consult [email or web address] for contact info under        Signed, Christell Constant, MD  03/26/2021, 8:52 AM

## 2021-03-26 NOTE — Progress Notes (Signed)
      [address]       Kimberly Calhoun [address]             [phone]        CARDIOTHORACIC SURGERY PROGRESS NOTE   R2 Days Post-Op Procedure(s) (LRB): CORONARY ARTERY BYPASS GRAFTING (CABG) TIMES THREE USING LEFT INTERNAL MAMMARY ARTERY AND RIGHT GREATER SAPHENOUS VEIN HARVESTED ENDOSCOPICALLY (N/A) TRANSESOPHAGEAL ECHOCARDIOGRAM (TEE) (N/A)  Subjective: Looks good. Feels weak.  Objective: Vital signs: BP Readings from Last 1 Encounters:  03/26/21 (!) 113/59   Pulse Readings from Last 1 Encounters:  03/26/21 69   Resp Readings from Last 1 Encounters:  03/26/21 (!) 21   Temp Readings from Last 1 Encounters:  03/26/21 97.8 F (36.6 C) (Oral)    Hemodynamics: PAP: (21-22)/(8-9) 22/9  Physical Exam:  Rhythm:   Sinus w/ PACs  Breath sounds: clear  Heart sounds:  RRR  Incisions:  Dressing dry, intact  Abdomen:  Soft, non-distended, non-tender  Extremities:  Warm, well-perfused  Chest tubes:  low volume thin serosanguinous output, no air leak   Intake/Output from previous day: 07/09 0701 - 07/10 0700 In: 1291.1 [P.O.:360; I.V.:580.8; IV Piggyback:350.3] Out: 1430 [Urine:910; Emesis/NG output:60; Chest Tube:460] Intake/Output this shift: No intake/output data recorded.  Lab Results:  CBC: Recent Labs    03/25/21 1700 03/26/21 0500  WBC 15.0* 14.6*  HGB 10.3* 9.9*  HCT 30.5* 29.5*  PLT 195 197    BMET:  Recent Labs    03/25/21 1750 03/26/21 0500  NA 130* 128*  K 3.6 3.9  CL 103 99  CO2 21* 23  GLUCOSE 163* 130*  BUN 15 17  CREATININE 0.93 0.85  CALCIUM 8.1* 8.2*     PT/INR:   Recent Labs    03/24/21 1711  LABPROT 16.4*  INR 1.3*    CBG (last 3)  Recent Labs    03/25/21 1204 03/25/21 2327 03/26/21 0757  GLUCAP 166* 135* 111*    ABG    Component Value Date/Time   PHART 7.364 03/25/2021 0115   PCO2ART 38.8 03/25/2021 0115   PO2ART 107 03/25/2021 0115   HCO3 22.0 03/25/2021 0115   TCO2 23 03/25/2021 0115    ACIDBASEDEF 3.0 (H) 03/25/2021 0115   O2SAT 98.0 03/25/2021 0115    CXR: PORTABLE CHEST 1 VIEW   COMPARISON:  Chest x-ray dated 03/25/2021.   FINDINGS: Heart size and mediastinal contours are stable. LEFT-sided chest tube is stable in position. RIGHT IJ Cordis remains status post Swan-Ganz catheter removal.   Probable mild atelectasis and/or small pleural effusions at each lung base. Lungs otherwise clear. No pneumothorax is seen.   IMPRESSION: Probable mild atelectasis and/or small pleural effusions at each lung base. No pneumothorax seen.     Electronically Signed   By: Bary Richard M.D.   On: 03/26/2021 08:48    Assessment/Plan: S/P Procedure(s) (LRB): CORONARY ARTERY BYPASS GRAFTING (CABG) TIMES THREE USING LEFT INTERNAL MAMMARY ARTERY AND RIGHT GREATER SAPHENOUS VEIN HARVESTED ENDOSCOPICALLY (N/A) TRANSESOPHAGEAL ECHOCARDIOGRAM (TEE) (N/A)  Doing well POD2 NSR w/ frequent PAC's on amiodarone, stable BP Breathing comfortably w/ O2 sats 99-100% Expected post op volume excess, weight reportedly 5 kg > preop, UOP adequate Expected post op acute blood loss anemia, stable  Mobilize Diuresis D/C tubes Convert amiodarone to oral Transfer 4E    Kimberly Nails, MD 03/26/2021 10:11 AM

## 2021-03-26 NOTE — Discharge Summary (Addendum)
Physician Discharge Summary       [address]       [name] [id number]             [phone]    Patient ID: Kimberly Calhoun MRN: [id number] DOB/AGE: 1941/06/13 80 y.o.  Admit date: 03/21/2021 Discharge date: 03/30/2021  Admission Diagnoses: NSTEMI (non-ST elevated myocardial infarction) (HCC) 2.  Coronary artery disease  Discharge Diagnoses:   S/P CABG x 3 Expected post op blood loss anemia History of CKD (stage III) (HCC) History of hyperlipidemia 5. History of osteopenia 6. History of colonic polyps 7. History of arthritis  Consults: cardiology  Procedure (s):  TRANSESOPHAGEAL ECHOCARDIOGRAM (TEE) CORONARY ARTERY BYPASS GRAFTING (CABG) TIMES THREE (LIMA to LAD, SVG to DIAGONAL, SVG to PDA) USING LEFT INTERNAL MAMMARY ARTERY AND RIGHT GREATER SAPHENOUS VEIN HARVESTED ENDOSCOPICALLY, Completion graft surveillance with indocyanine green fluorescence angiography by Dr. Vickey Sages on 03/24/2021.   History of Presenting Illness: [name] is an 80 year old female with history of dyslipidemia and chronic stage III kidney disease.  She has no prior cardiac history.  She presented to the [facility] ED on 03/21/2021 after an episode of chest discomfort radiating to her arms that lasted about 5 minutes.  She had been having similar episodes postprandially lasting less than a minute for a few months.  She was evaluated by cardiology in May of this year with a similar complaint of chest discomfort and shortness of breath.  The chest pain was felt to be originating from her chest wall and the shortness of breath was thought to possibly be of allergic reaction triggered by her cat. In the emergency room, EKG showed nonspecific ST-T wave changes.  Troponin was 87 and later rose to 90.  She was noted to be hypertensive in the emergency room although she had no prior history of hypertension.  She was admitted to the hospital and started on aspirin along with low-dose  amlodipine.  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  The left ventricular ejection fraction was estimated at 60 to 65%.  There was no significant mitral insufficiency, no evidence of mitral stenosis, and no valve insufficiency.  On the day after admission, she had a Myoview nuclear stress test that showed a large area of ischemia.  Following the Myoview, she had chest pain once again and high-sensitivity troponin was again checked and had risen to above 3800.  She was started on heparin and nitroglycerin infusions.  The cardiology team proceeded with left heart catheterization in the evening on 03/22/2021.  This demonstrated a proximal 90% stenosis in the LAD before the first diagonal and a long area of 99% stenosis in the LAD after the second diagonal.  Additionally, there was an 80% mid stenosis in the RCA and a proximal 80% stenosis in the posterior descending coronary artery.  Dr. Vickey Sages discussed the need for coronary artery bypass grafting surgery. Potential risks, benefits, and complications of the surgery were discussed with the patient and she agreed to proceed with surgery. Pre operative carotid duplex US showed no significant internal carotid artery stenosis bilaterally. She underwent a CABG x 3.  Brief Hospital Course:  as transferred from the OR to [facility] ICU in stable condition. She was extubated after midnight without difficulty. She remained afebrile and hemodynamically stable. The patient was extubated the evening of surgery without difficulty. She remained afebrile and hemodynamically stable. Kimberly Calhoun, a line, chest tubes, and  foley were removed early in the post operative course. She was started on an Amiodarone drip. Lopressor was started and titrated accordingly. She was volume over loaded and diuresed. Of note, she has CKD (stage III) and her creatinine post op remained stable. Her last creatinine was stable at 0.84. She had  ABL anemia. She did not require a post op transfusion. Last H and H was stable at 10.1 and 30.3.  She was weaned off the insulin drip. The patient's glucose remained well controlled.The patient's HGA1C pre op was 5.8.  Accu checks and SS PRN were stopped upon transfer. The patient was felt surgically stable for transfer from the ICU to [facility] for further convalescence on 07/10 .  She continues to progress with cardiac rehab. She was ambulating on room air. She had nausea on post op day 3 and was given Reglan;nausea did resolve. She has been tolerating a diet and has had a bowel movement. She had loose stools so stool softeners were stopped. Colchicine will be stopped at discharge as well. Epicardial pacing wires were removed on 07/11.  Because of PAF, ec asa was decreased to 81 mg and she was started on Apixaban (as discussed with Dr. Vickey Sages). Also, Lopressor was increased to 37.5 mg bid for better rate control. Follow up appointment has been made in the a fib clinic. Patient has allergy to statin. Cardiology will decide whether or not to try PCSK9 as an outpatient. Chest tube sutures will be removed today. As discussed with Dr. Vickey Sages, the patient is felt surgically stable for discharge today.    Latest Vital Signs: Blood pressure 129/63, pulse 99, temperature 98.2 F (36.8 C), temperature source Oral, resp. rate 19, height 5\' 3"  (1.6 m), weight 63 kg, SpO2 96 %.  Physical Exam:  Cardiovascular: RRR Pulmonary:Mostly clear Abdomen: Soft, non tender, bowel sounds present. Extremities: Mild bilateral lower extremity edema R>L but is decreasing. Ecchymosis right thigh Wounds: Sternal wound is clean and dry.  No erythema or signs of infection. [name] wound is clean and dry  Discharge Condition: Stable and discharged to home.  Recent laboratory studies:  Lab Results  Component Value Date   WBC 12.5 (H) 03/28/2021   HGB 10.1 (L) 03/28/2021   HCT 30.3 (L) 03/28/2021   MCV 88.6 03/28/2021   PLT 301  03/28/2021   Lab Results  Component Value Date   NA 134 (L) 03/28/2021   K 4.0 03/28/2021   CL 99 03/28/2021   CO2 28 03/28/2021   CREATININE 0.84 03/28/2021   GLUCOSE 95 03/28/2021      Diagnostic Studies: DG Chest 2 View  Result Date: 03/28/2021 CLINICAL DATA:  Pneumothorax EXAM: CHEST - 2 VIEW COMPARISON:  03/27/2021 FINDINGS: No definite residual pneumothorax. Small bilateral pleural effusions with adjacent atelectasis. Stable cardiomediastinal contours. IMPRESSION: No definite residual pneumothorax. Small bilateral pleural effusions with adjacent atelectasis. Electronically Signed   By: 05/28/2021 M.D.   On: 03/28/2021 13:26   DG Chest 2 View  Result Date: 03/27/2021 CLINICAL DATA:  Recent CABG.  Atelectasis. EXAM: CHEST - 2 VIEW COMPARISON:  03/26/2021 and CT chest 03/23/2021. FINDINGS: Trachea is midline. Heart is enlarged. Trace left apical pneumothorax. Epicardial pacer wires remain in place. Other support devices has been removed. Mild bibasilar subsegmental atelectasis. Right apical pleural thickening. Small bilateral pleural effusions. IMPRESSION: 1. Trace left apical pneumothorax after chest tube removal. 2. Small bilateral pleural effusions. 3. Mild bibasilar subsegmental atelectasis. Electronically Signed   By: 05/24/2021 M.D.  On: 03/27/2021 08:20   DG Chest 2 View  Result Date: 03/21/2021 CLINICAL DATA:  80 year old female with history of chest pain and shortness of breath. EXAM: CHEST - 2 VIEW COMPARISON:  No priors. FINDINGS: Lung volumes are normal. No consolidative airspace disease. There is some volume loss and architectural distortion in the region of the right middle lobe. No pleural effusions. No pneumothorax. No pulmonary nodule or mass noted. Pulmonary vasculature and the cardiomediastinal silhouette are within normal limits. Atherosclerotic calcifications in the thoracic aorta. IMPRESSION: 1. Volume loss and architectural distortion in the region of the  right middle lobe which could reflect areas of chronic post infectious or inflammatory scarring, however, no prior studies are available for comparison. Repeat standing PA and lateral chest radiograph is recommended in 1-2 months to ensure the stability of this finding. 2. Aortic atherosclerosis. Electronically Signed   By: Trudie Reed M.D.   On: 03/21/2021 10:33   CT CHEST WO CONTRAST  Result Date: 03/23/2021 CLINICAL DATA:  Thoracic aortic Disease. Preoperative planning for coronary bypass. EXAM: CT CHEST WITHOUT CONTRAST TECHNIQUE: Multidetector CT imaging of the chest was performed following the standard protocol without IV contrast. COMPARISON:  None. FINDINGS: Cardiovascular: Normal heart size. No pericardial effusions. Coronary artery and mild aortic calcification. Normal caliber thoracic aorta. Mediastinum/Nodes: Thyroid gland is unremarkable. Esophagus is decompressed. Scattered lymph nodes in the mediastinum are not pathologically enlarged, likely reactive. Small esophageal hiatal hernia. Lungs/Pleura: There is a focal area of patchy tree-in-bud infiltration in a peribronchial distribution in the right middle lung and less prominently in the left lingula and left upper lung. These changes may represent early bronchopneumonia or sequela of airways disease. Airways are patent. Ovoid nodule along the right minor fissure measuring 0.5 x 0.9 cm. This likely represents an inter fissural lymph node. No pleural effusions. No pneumothorax. Upper Abdomen: No acute abnormalities demonstrated in the visualized upper abdomen. Musculoskeletal: Degenerative changes in the spine. No destructive bone lesions. IMPRESSION: 1. Patchy areas of peribronchial tree-in-bud nodular infiltrates bilaterally but most prominent in the right middle lung. These may represent early multifocal pneumonia or airways disease. 2. Right mid lung nodule along the minor fissure likely represents an inter fissural lymph node. 3. Small  esophageal hiatal hernia. Electronically Signed   By: Burman Nieves M.D.   On: 03/23/2021 19:41   CARDIAC CATHETERIZATION  Result Date: 03/22/2021  Mid LM to Dist LM lesion is 40% stenosed.  Prox LAD lesion is 90% stenosed.  Mid LAD to Dist LAD lesion is 99% stenosed.  Prox RCA lesion is 50% stenosed.  Dist RCA lesion is 30% stenosed.  Prox RCA to Mid RCA lesion is 80% stenosed.  RPDA lesion is 80% stenosed.  Dist LAD-1 lesion is 99% stenosed.  Dist LAD-2 lesion is 90% stenosed.  Prox LAD to Mid LAD lesion is 40% stenosed with 40% stenosed side branch in 1st Diag.  The left ventricular ejection fraction is greater than 65% by visual estimate.  Severe multivessel CAD with significant calcification in the proximal LAD with 40% smooth proximal stenosis followed by 90% stenosis with irregularity in the region of the first diagonal takeoff.  The LAD is subtotally occluded after a very tortuous large second diagonal vessel. 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 Large dominant RCA with 30% proximal stenosis followed by 50 and 80% eccentric mid stenoses with 30% stenosis before the PDA takeoff and 80% stenosis in the PDA.  The  distal RCA supplies 4 large branches which extend to the LV apex. 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 RECOMMENDATION: Angiograms will be reviewed with colleagues.  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  The Myoview study suggests significant viability to the LAD territory.  Consider surgical consultation for CABG revascularization.   NM Myocar Multi W/Spect W/Wall Motion / EF  Result Date: 03/22/2021 CLINICAL DATA:  Chest pain. History of macular degeneration and  chronic kidney disease, stage III A. EXAM: MYOCARDIAL IMAGING WITH SPECT (REST AND PHARMACOLOGIC-STRESS) GATED LEFT VENTRICULAR WALL MOTION STUDY LEFT VENTRICULAR EJECTION FRACTION TECHNIQUE: Standard myocardial SPECT imaging was performed after resting intravenous injection of 10 mCi Tc-7m tetrofosmin. Subsequently, intravenous infusion of Lexiscan was performed under the supervision of the Cardiology staff. At peak effect of the drug, 32.3 mCi Tc-9m tetrofosmin was injected intravenously and standard myocardial SPECT imaging was performed. Quantitative gated imaging was also performed to evaluate left ventricular wall motion, and estimate left ventricular ejection fraction. COMPARISON:  Chest radiograph 03/21/2021. FINDINGS: Perfusion: Study is mildly degraded by prominent subdiaphragmatic activity. There is a large reversible perfusion defect involving the left ventricular apex, distal segments of all walls and the mid septum. There is incomplete reversibility in the distal septum. Summed difference score is 17. Wall Motion: Diffusely abnormal wall motion with apical hypokinesis and paradoxical motion in the septum. Left Ventricular Ejection Fraction: 59 % End diastolic volume 57 ml End systolic volume 24 ml IMPRESSION: 1. Large area of reversible ischemia involving most of the septum, distal segments of all walls and the left ventricular apex as described. 2. Abnormal left ventricular wall motion with apical hypokinesis and paradoxical motion in the septum. 3. Left ventricular ejection fraction 59% 4. Non invasive risk stratification*: High *2012 Appropriate Use Criteria for Coronary Revascularization Focused Update: J Am Coll Cardiol. 2012;59(9):857-881. http://content.dementiazones.com.aspx?articleid=1201161 These results were discussed by telephone at the time of interpretation on 03/22/2021 at 3:07 pm with provider Theodore Demark, PA-C, who verbally acknowledged these results. Electronically Signed    By: Carey Bullocks M.D.   On: 03/22/2021 15:12   DG Chest Port 1 View  Result Date: 03/26/2021 CLINICAL DATA:  Status post CABG. EXAM: PORTABLE CHEST 1 VIEW COMPARISON:  Chest x-ray dated 03/25/2021. FINDINGS: Heart size and mediastinal contours are stable. LEFT-sided chest tube is stable in position. RIGHT IJ Cordis remains status post Swan-Ganz catheter removal. Probable mild atelectasis and/or small pleural effusions at each lung base. Lungs otherwise clear. No pneumothorax is seen. IMPRESSION: Probable mild atelectasis and/or small pleural effusions at each lung base. No pneumothorax seen. Electronically Signed   By: Bary Richard M.D.   On: 03/26/2021 08:48   DG Chest Port 1 View  Result Date: 03/25/2021 CLINICAL DATA:  Encounter for pneumothorax.  Status post CABG. EXAM: PORTABLE CHEST 1 VIEW COMPARISON:  Chest x-rays dated 03/24/2021 and 03/21/2021. FINDINGS: LEFT-sided chest tube is stable in position. Probable mild bibasilar atelectasis and/or small pleural effusions. Lungs otherwise clear. No pneumothorax is seen. Mediastinal drain and Swan-Ganz catheter are stable in position. Endotracheal tube and enteric tube have been removed. IMPRESSION: Probable mild bibasilar atelectasis and/or small pleural effusions. No pneumothorax seen. LEFT-sided chest tube is stable in position. Electronically Signed   By: Bary Richard M.D.   On: 03/25/2021 08:44   DG Chest Port 1  View  Result Date: 03/24/2021 CLINICAL DATA:  Patient status post CABG today. EXAM: PORTABLE CHEST 1 VIEW COMPARISON:  PA and lateral chest 03/21/2021 FINDINGS: Endotracheal tube is in good position with the tip at the level of the clavicular heads. Right IJ approach Swan-Ganz catheter tip is in the mid to distal right main pulmonary artery. Mediastinal drain and left chest tube noted. There is bibasilar atelectasis. No pneumothorax pulmonary edema. Heart size normal. Aortic atherosclerosis. IMPRESSION: Support tubes and lines projecting  good position. Negative for pneumothorax or acute disease. Electronically Signed   By: Drusilla Kanner M.D.   On: 03/24/2021 18:39   ECHOCARDIOGRAM COMPLETE  Result Date: 03/22/2021    ECHOCARDIOGRAM REPORT   Patient Name:   Kimberly Calhoun Date of Exam: 03/22/2021 Medical Rec #:  [id number]      Height:       63.0 in Accession #:    [id number]     Weight:       142.6 lb Date of Birth:  09-Feb-1941      BSA:          1.675 m Patient Age:    79 years       BP:           188/85 mmHg Patient Gender: F              HR:           105 bpm. Exam Location:  Inpatient Procedure: 2D Echo Indications:    chest pain  History:        Patient has no prior history of Echocardiogram examinations.  Sonographer:    Delcie Roch Referring Phys: [id number] Beatriz Stallion  Sonographer Comments: Image acquisition challenging due to respiratory motion. IMPRESSIONS  1. LVOT gradient 43 mmHg with Valsalva. Left ventricular ejection fraction, by estimation, is 60 to 65%. The left ventricle has normal function. The left ventricle demonstrates regional wall motion abnormalities (see scoring diagram/findings for description). There is moderate asymmetric left ventricular hypertrophy of the basal-septal segment. Left ventricular diastolic parameters are indeterminate. There is hypokinesis of the left ventricular, basal-mid anteroseptal wall and inferoseptal wall.  2. Right ventricular systolic function is normal. The right ventricular size is normal.  3. The mitral valve is degenerative. Mild mitral valve regurgitation. No evidence of mitral stenosis.  4. The aortic valve was not well visualized. Aortic valve regurgitation is not visualized.  5. The inferior vena cava is normal in size with greater than 50% respiratory variability, suggesting right atrial pressure of 3 mmHg. Comparison(s): No prior Echocardiogram. Conclusion(s)/Recommendation(s): Focal hypokinesis at basal septum with preserved EF. FINDINGS  Left Ventricle: LVOT gradient 43  mmHg with Valsalva. Left ventricular ejection fraction, by estimation, is 60 to 65%. The left ventricle has normal function. The left ventricle demonstrates regional wall motion abnormalities. The left ventricular internal cavity size was normal in size. There is moderate asymmetric left ventricular hypertrophy of the basal-septal segment. Left ventricular diastolic parameters are indeterminate. Right Ventricle: The right ventricular size is normal. No increase in right ventricular wall thickness. Right ventricular systolic function is normal. Left Atrium: Left atrial size was normal in size. Right Atrium: Right atrial size was normal in size. Pericardium: There is no evidence of pericardial effusion. Presence of pericardial fat pad. Mitral Valve: The mitral valve is degenerative in appearance. There is moderate thickening of the mitral valve leaflet(s). There is moderate calcification of the mitral valve leaflet(s). Mild to moderate mitral annular calcification. Mild mitral valve  regurgitation. No evidence of mitral valve stenosis. Tricuspid Valve: The tricuspid valve is grossly normal. Tricuspid valve regurgitation is trivial. No evidence of tricuspid stenosis. Aortic Valve: The aortic valve was not well visualized. Aortic valve regurgitation is not visualized. Pulmonic Valve: The pulmonic valve was not well visualized. Pulmonic valve regurgitation is not visualized. Aorta: The aortic root, ascending aorta, aortic arch and descending aorta are all structurally normal, with no evidence of dilitation or obstruction. Venous: The inferior vena cava is normal in size with greater than 50% respiratory variability, suggesting right atrial pressure of 3 mmHg. IAS/Shunts: The interatrial septum was not well visualized.  LEFT VENTRICLE PLAX 2D LVIDd:         3.90 cm  Diastology LVIDs:         2.40 cm  LV e' medial:    6.96 cm/s LV PW:         0.80 cm  LV E/e' medial:  10.6 LV IVS:        0.80 cm  LV e' lateral:   7.72 cm/s  LVOT diam:     1.80 cm  LV E/e' lateral: 9.5 LVOT Area:     2.54 cm  RIGHT VENTRICLE RV S prime:     16.80 cm/s TAPSE (M-mode): 1.7 cm LEFT ATRIUM             Index       RIGHT ATRIUM          Index LA diam:        3.10 cm 1.85 cm/m  RA Area:     8.14 cm LA Vol (A2C):   26.6 ml 15.88 ml/m RA Volume:   14.70 ml 8.78 ml/m LA Vol (A4C):   27.4 ml 16.36 ml/m LA Biplane Vol: 28.7 ml 17.14 ml/m   AORTA Ao Root diam: 3.00 cm Ao Asc diam:  2.70 cm MV E velocity: 73.70 cm/s MV A velocity: 147.00 cm/s  SHUNTS MV E/A ratio:  0.50         Systemic Diam: 1.80 cm Jodelle Red MD Electronically signed by Jodelle Red MD Signature Date/Time: 03/22/2021/6:38:34 PM    Final    ECHO INTRAOPERATIVE TEE  Result Date: 03/24/2021  *INTRAOPERATIVE TRANSESOPHAGEAL REPORT *  Patient Name:   Kimberly Calhoun Date of Exam: 03/24/2021 Medical Rec #:  [id number]      Height:       63.0 in Accession #:    [id number]     Weight:       136.2 lb Date of Birth:  1941/09/11      BSA:          1.64 m Patient Age:    79 years       BP:           153/82 mmHg Patient Gender: F              HR:           92 bpm. Exam Location:  Anesthesiology Transesophogeal exam was perform intraoperatively during surgical procedure. Patient was closely monitored under general anesthesia during the entirety of examination. Indications:     Coronary Artery Disease Performing Phys: [id number] OIZTIWP Z ATKINS Diagnosing Phys: Leslye Peer MD Complications: No known complications during this procedure. POST-OP IMPRESSIONS - Left Ventricle: The left ventricle is unchanged from pre-bypass. - Right Ventricle: The right ventricle appears unchanged from pre-bypass. - Aorta: The aorta appears unchanged from pre-bypass. - Left Atrium: The left atrium appears unchanged from pre-bypass. -  Left Atrial Appendage: The left atrial appendage appears unchanged from pre-bypass. - Aortic Valve: The aortic valve appears unchanged from pre-bypass. - Mitral Valve: The mitral  valve appears unchanged from pre-bypass. - Tricuspid Valve: The tricuspid valve appears unchanged from pre-bypass. - Pulmonic Valve: The pulmonic valve appears unchanged from pre-bypass. - Interatrial Septum: The interatrial septum appears unchanged from pre-bypass. - Interventricular Septum: The interventricular septum appears unchanged from pre-bypass. - Pericardium: The pericardium appears unchanged from pre-bypass. PRE-OP FINDINGS  Left Ventricle: The left ventricle has normal systolic function, with an ejection fraction of 60-65%. The cavity size was normal. There is asymmetric left ventricular hypertrophy of the basal-septal segment. Left ventricular diastolic function could not  be evaluated.  LV Wall Scoring: Hypokinesis of the antero- and inferoseptal wall, basal segments.  Right Ventricle: The right ventricle has normal systolic function. The cavity was normal. There is no increase in right ventricular wall thickness. Left Atrium: Left atrial size was normal in size. No left atrial/left atrial appendage thrombus was detected. Right Atrium: Right atrial size was normal in size. Interatrial Septum: No atrial level shunt detected by color flow Doppler. Pericardium: There is no evidence of pericardial effusion. Mitral Valve: The mitral valve is normal in structure. Mitral valve regurgitation mild to moderate. The MR jet is posteriorly-directed. Tricuspid Valve: The tricuspid valve was normal in structure. Tricuspid valve regurgitation is trivial by color flow Doppler. Aortic Valve: The aortic valve is tricuspid Aortic valve regurgitation was not visualized by color flow Doppler. Pulmonic Valve: The pulmonic valve was not assessed. Pulmonic valve regurgitation was not assessed by color flow Doppler.  Leslye Peer MD Electronically signed by Leslye Peer MD Signature Date/Time: 03/24/2021/4:54:52 PM    Final    VAS US DOPPLER PRE CABG  Result Date: 03/25/2021 PREOPERATIVE VASCULAR EVALUATION Patient Name:   Kimberly Calhoun  Date of Exam:   03/23/2021 Medical Rec #: [id number]       Accession #:    [id number] Date of Birth: 03/02/1941       Patient Gender: F Patient Age:   14Y Exam Location:  [facility] Procedure:      VAS US DOPPLER PRE CABG Referring Phys: [id number] BROADUS Z ATKINS --------------------------------------------------------------------------------  Indications: Pre-CABG. Performing Technologist: Jean Rosenthal RDMS,RVT  Examination Guidelines: A complete evaluation includes B-mode imaging, spectral Doppler, color Doppler, and power Doppler as needed of all accessible portions of each vessel. Bilateral testing is considered an integral part of a complete examination. Limited examinations for reoccurring indications may be performed as noted.  Right Carotid Findings: +----------+--------+--------+--------+----------------------------+--------+           PSV cm/sEDV cm/sStenosisDescribe                    Comments +----------+--------+--------+--------+----------------------------+--------+ CCA Prox  104     15                                                   +----------+--------+--------+--------+----------------------------+--------+ CCA Distal83      18                                                   +----------+--------+--------+--------+----------------------------+--------+ ICA Prox  64  17      1-39%   hyperechoic and heterogenous         +----------+--------+--------+--------+----------------------------+--------+ ICA Distal94      34                                                   +----------+--------+--------+--------+----------------------------+--------+ ECA       147     15                                                   +----------+--------+--------+--------+----------------------------+--------+ Portions of this table do not appear on this page. +----------+--------+-------+----------------+------------+           PSV cm/sEDV  cmsDescribe        Arm Pressure +----------+--------+-------+----------------+------------+ [id number]            Multiphasic, WNL             +----------+--------+-------+----------------+------------+ +---------+--------+--+--------+--+---------+ VertebralPSV cm/s69EDV cm/s20Antegrade +---------+--------+--+--------+--+---------+ Left Carotid Findings: +----------+--------+--------+--------+----------------------------+--------+           PSV cm/sEDV cm/sStenosisDescribe                    Comments +----------+--------+--------+--------+----------------------------+--------+ CCA Prox  109     19                                                   +----------+--------+--------+--------+----------------------------+--------+ CCA Distal75      16              heterogenous                         +----------+--------+--------+--------+----------------------------+--------+ ICA Prox  58      18      1-39%   hyperechoic and heterogenous         +----------+--------+--------+--------+----------------------------+--------+ ICA Distal84      27                                                   +----------+--------+--------+--------+----------------------------+--------+ ECA       140     12                                                   +----------+--------+--------+--------+----------------------------+--------+ +----------+--------+--------+----------------+------------+ SubclavianPSV cm/sEDV cm/sDescribe        Arm Pressure +----------+--------+--------+----------------+------------+           178             Multiphasic, WNL             +----------+--------+--------+----------------+------------+ +---------+--------+--+--------+--+---------+ VertebralPSV cm/s62EDV cm/s17Antegrade +---------+--------+--+--------+--+---------+  ABI Findings: +--------+------------------+-----+---------+--------+ Right   Rt Pressure (mmHg)IndexWaveform  Comment  +--------+------------------+-----+---------+--------+ Brachial  triphasic         +--------+------------------+-----+---------+--------+ PTA                            triphasic         +--------+------------------+-----+---------+--------+ DP                             biphasic          +--------+------------------+-----+---------+--------+ +--------+------------------+-----+---------+-------+ Left    Lt Pressure (mmHg)IndexWaveform Comment +--------+------------------+-----+---------+-------+ Brachial                       triphasic        +--------+------------------+-----+---------+-------+ PTA                            triphasic        +--------+------------------+-----+---------+-------+ DP                             biphasic         +--------+------------------+-----+---------+-------+  Right Doppler Findings: +--------+--------+-----+---------+--------+ Site    PressureIndexDoppler  Comments +--------+--------+-----+---------+--------+ Brachial             triphasic         +--------+--------+-----+---------+--------+ Radial               triphasic         +--------+--------+-----+---------+--------+ Ulnar                triphasic         +--------+--------+-----+---------+--------+  Left Doppler Findings: +--------+--------+-----+---------+--------+ Site    PressureIndexDoppler  Comments +--------+--------+-----+---------+--------+ Brachial             triphasic         +--------+--------+-----+---------+--------+ Radial               triphasic         +--------+--------+-----+---------+--------+ Ulnar                triphasic         +--------+--------+-----+---------+--------+  Summary: Right Carotid: Velocities in the right ICA are consistent with a 1-39% stenosis. Left Carotid: Velocities in the left ICA are consistent with a 1-39% stenosis. Vertebrals:  Bilateral vertebral  arteries demonstrate antegrade flow. Subclavians: Normal flow hemodynamics were seen in bilateral subclavian              arteries. Right Upper Extremity: Doppler waveforms remain within normal limits with right radial compression. Doppler waveforms remain within normal limits with right ulnar compression. Left Upper Extremity: Doppler waveforms remain within normal limits with left radial compression. Doppler waveforms remain within normal limits with left ulnar compression.  Electronically signed by Heath Lark on 03/25/2021 at 4:27:35 PM.    Final    Discharge Instructions     Amb Referral to Cardiac Rehabilitation   Complete by: As directed    Diagnosis:  CABG STEMI     CABG X ___: 3   After initial evaluation and assessments completed: Virtual Based Care may be provided alone or in conjunction with Phase 2 Cardiac Rehab based on patient barriers.: Yes       Discharge Medications: Allergies as of 03/30/2021       Reactions   Macrobid [nitrofurantoin] Other (See Comments)   Cold chills, muscle weakness.   Mold Extract [  trichophyton] Other (See Comments)   Allergies.    Statins Other (See Comments)   Other Other (See Comments)   Allergies.  Mold in Cheese - stuffy nose and headache  Chocolate- stuffy nose and headache  Mildew - stuffy nose and headache        Medication List     STOP taking these medications    ibuprofen 200 MG tablet Commonly known as: ADVIL       TAKE these medications    acetaminophen 500 MG tablet Commonly known as: TYLENOL Take 500 mg by mouth every 6 (six) hours as needed for moderate pain or headache.   albuterol 108 (90 Base) MCG/ACT inhaler Commonly known as: VENTOLIN HFA 1-2 puffs as needed What changed: Another medication with the same name was removed. Continue taking this medication, and follow the directions you see here.   amiodarone 200 MG tablet Commonly known as: PACERONE Take 1 tablet (200 mg total) by mouth 2 (two) times  daily. For one week then take Amiodarone 200 mg daily thereafter   apixaban 5 MG Tabs tablet Commonly known as: ELIQUIS Take 1 tablet (5 mg total) by mouth 2 (two) times daily.   aspirin EC 81 MG tablet Take 81 mg by mouth every morning.   CALCIUM PO Take 1 tablet by mouth at bedtime as needed.   cetirizine 10 MG tablet Commonly known as: ZYRTEC Take 10 mg by mouth daily.   Cyanocobalamin 5000 MCG Subl Place under the tongue.   ezetimibe 10 MG tablet Commonly known as: ZETIA Take 1 tablet (10 mg total) by mouth daily. Start taking on: March 31, 2021   furosemide 40 MG tablet Commonly known as: LASIX Take 1 tablet (40 mg total) by mouth daily. For 4 days then stop. Start taking on: March 31, 2021   Metoprolol Tartrate 37.5 MG Tabs Take 37.5 mg by mouth 2 (two) times daily.   multivitamin with minerals Tabs tablet Take 1 tablet by mouth at bedtime. Centrum silver.   potassium chloride SA 20 MEQ tablet Commonly known as: KLOR-CON Take 1 tablet (20 mEq total) by mouth daily. For 4 days then stop. Start taking on: March 31, 2021   SYSTANE BALANCE OP Apply 1 drop to eye daily as needed (dry eyes.).   traMADol 50 MG tablet Commonly known as: ULTRAM Take 1 tablet (50 mg total) by mouth every 6 (six) hours as needed for moderate pain.   vitamin C 500 MG tablet Commonly known as: ASCORBIC ACID Take 500 mg by mouth every morning.   VITAMIN D PO Take 1 tablet by mouth every morning.   vitamin E 180 MG (400 UNITS) capsule Take 1 capsule (400 Units total) by mouth every morning. Start taking on: April 24, 2021 What changed: These instructions start on April 24, 2021. If you are unsure what to do until then, ask your doctor or other care provider.   zinc gluconate 50 MG tablet Take 50 mg by mouth daily.       The patient has been discharged on:   1.Beta Blocker:  Yes [ x  ]                              No   [   ]                              If No,  reason:  2.Ace  Inhibitor/ARB: Yes [   ]                                     No  [  x  ]                                     If No, reason:Labile BP  3.Statin:   Yes [  x]                  No  [  x ]                  If No, reason:Allergy. Cardiology to decide if PCSK9 should be started  4.Marlowe Kays:  Yes  [  x ]                  No   [   ]                  If No, reason:  Patient had ACS upon admission:No  Plavix/P2Y12 inhibitor: Yes [   ]                                      No  [ x  ]   Follow Up Appointments:  Follow-up Information     Linden Dolin, MD. Go on 04/13/2021.   Specialty: Cardiothoracic Surgery Why: Appointment time is at 3:30 pm Contact information: [address] [place] [address] [phone]         Manson Passey, [place]. Go on 04/19/2021.   Specialty: Cardiology Why: Appointment time is at 11:15 am Contact information: [address] [place] [address] [phone]         Danice Goltz, [place]. Go on 04/11/2021.   Specialty: Cardiology Why: Appointment time is at 2:00 pm Contact information: [address] [place] [address] [phone]                 Signed: Lelon Huh [facility] 03/30/2021, 12:28 PM

## 2021-03-27 ENCOUNTER — Encounter (HOSPITAL_COMMUNITY): Payer: Self-pay | Admitting: Cardiothoracic Surgery

## 2021-03-27 ENCOUNTER — Inpatient Hospital Stay (HOSPITAL_COMMUNITY): Payer: Medicare Other

## 2021-03-27 DIAGNOSIS — I480 Paroxysmal atrial fibrillation: Secondary | ICD-10-CM

## 2021-03-27 LAB — CBC
HCT: 29.6 % — ABNORMAL LOW (ref 36.0–46.0)
Hemoglobin: 10.1 g/dL — ABNORMAL LOW (ref 12.0–15.0)
MCH: 29.9 pg (ref 26.0–34.0)
MCHC: 34.1 g/dL (ref 30.0–36.0)
MCV: 87.6 fL (ref 80.0–100.0)
Platelets: 214 10*3/uL (ref 150–400)
RBC: 3.38 MIL/uL — ABNORMAL LOW (ref 3.87–5.11)
RDW: 15.1 % (ref 11.5–15.5)
WBC: 15 10*3/uL — ABNORMAL HIGH (ref 4.0–10.5)
nRBC: 0 % (ref 0.0–0.2)

## 2021-03-27 LAB — BASIC METABOLIC PANEL
Anion gap: 8 (ref 5–15)
BUN: 13 mg/dL (ref 8–23)
CO2: 23 mmol/L (ref 22–32)
Calcium: 8.2 mg/dL — ABNORMAL LOW (ref 8.9–10.3)
Chloride: 98 mmol/L (ref 98–111)
Creatinine, Ser: 0.8 mg/dL (ref 0.44–1.00)
GFR, Estimated: >60 mL/min (ref >60)
Glucose, Bld: 118 mg/dL — ABNORMAL HIGH (ref 70–99)
Potassium: 3.6 mmol/L (ref 3.5–5.1)
Sodium: 129 mmol/L — ABNORMAL LOW (ref 135–145)

## 2021-03-27 MED ORDER — ADULT MULTIVITAMIN W/MINERALS CH
1.0000 | ORAL_TABLET | Freq: Every day | ORAL | Status: DC
  Administered 2021-03-27 – 2021-03-30 (×4): 1 via ORAL
  Filled 2021-03-27 (×3): qty 1

## 2021-03-27 MED ORDER — METOCLOPRAMIDE HCL 5 MG/ML IJ SOLN
10.0000 mg | Freq: Four times a day (QID) | INTRAMUSCULAR | Status: DC
  Administered 2021-03-27 – 2021-03-28 (×4): 10 mg via INTRAVENOUS
  Filled 2021-03-27 (×3): qty 2

## 2021-03-27 MED ORDER — ENSURE ENLIVE PO LIQD
237.0000 mL | Freq: Two times a day (BID) | ORAL | Status: DC
  Administered 2021-03-27 – 2021-03-29 (×3): 237 mL via ORAL

## 2021-03-27 MED ORDER — METOPROLOL TARTRATE 25 MG PO TABS
25.0000 mg | ORAL_TABLET | Freq: Two times a day (BID) | ORAL | Status: DC
  Administered 2021-03-27 – 2021-03-28 (×3): 25 mg via ORAL
  Filled 2021-03-27 (×3): qty 1

## 2021-03-27 MED ORDER — POTASSIUM CHLORIDE CRYS ER 20 MEQ PO TBCR
40.0000 meq | EXTENDED_RELEASE_TABLET | Freq: Two times a day (BID) | ORAL | Status: AC
  Administered 2021-03-27 (×2): 40 meq via ORAL
  Filled 2021-03-27 (×2): qty 2

## 2021-03-27 NOTE — Progress Notes (Addendum)
Progress Note  Patient Name: Kimberly Calhoun Date of Encounter: 03/27/2021  [facility] Cardiologist: Lance MussJayadeep Varanasi, MD   Subjective   Not feeling well this morning but no specific complaints. Feels she a little short of breath with shallow breathing. We discussed importance of using her incentive spirometer. She has some chest soreness. No complaints of palpitations or racing heart beat sensations.    Inpatient Medications    Scheduled Meds:  acetaminophen  1,000 mg Oral Q6H   amiodarone  400 mg Oral Daily   aspirin EC  325 mg Oral Daily   bisacodyl  10 mg Oral Daily   Or   bisacodyl  10 mg Rectal Daily   Chlorhexidine Gluconate Cloth  6 each Topical Q0600   colchicine  0.3 mg Oral BID   docusate sodium  200 mg Oral Daily   enoxaparin (LOVENOX) injection  30 mg Subcutaneous QHS   ezetimibe  10 mg Oral Daily   furosemide  40 mg Oral BID   loratadine  10 mg Oral Daily   metoCLOPramide (REGLAN) injection  10 mg Intravenous Q6H   metoprolol tartrate  25 mg Oral BID   mupirocin ointment  1 application Nasal BID   pantoprazole  40 mg Oral Daily   potassium chloride  40 mEq Oral BID   sodium chloride flush  3 mL Intravenous Q12H   Continuous Infusions:  sodium chloride     lactated ringers     lactated ringers Stopped (03/25/21 1121)   PRN Meds: levalbuterol, metoprolol tartrate, morphine injection, ondansetron (ZOFRAN) IV, oxyCODONE, sodium chloride flush, traMADol   Vital Signs    Vitals:   03/26/21 2341 03/27/21 0349 03/27/21 0526 03/27/21 0825  BP: (!) 121/58 122/67  140/79  Pulse: 82 (!) 102  (!) 109  Resp: 19 20  20   Temp: 97.8 F (36.6 C) 97.7 F (36.5 C)  98.3 F (36.8 C)  TempSrc: Oral Oral  Oral  SpO2: 95% 96%  96%  Weight:   68 kg   Height:        Intake/Output Summary (Last 24 hours) at 03/27/2021 0927 Last data filed at 03/27/2021 0539 Gross per 24 hour  Intake 289.49 ml  Output 550 ml  Net -260.51 ml   Last 3 Weights 03/27/2021 03/26/2021  03/25/2021  Weight (lbs) 150 lb 153 lb 149 lb 0.5 oz  Weight (kg) 68.04 kg 69.4 kg 67.6 kg      Telemetry    2 episodes of atrial fibrillation this morning from ~3:15am-5:30am and again from ~8am-~9:15am with rates up to 110s during these episodes - Personally Reviewed  ECG    No new tracings - Personally Reviewed  Physical Exam   GEN: No acute distress.   Neck: No JVD Cardiac: RRR, no murmurs, rubs, or gallops; sternal wound healing well   Respiratory: crackles at lung bases without wheezing/rhonchi. GI: Soft, nontender, non-distended  MS: No edema; No deformity. Neuro:  Nonfocal  Psych: Normal affect   Labs    High Sensitivity Troponin:   Recent Labs  Lab 03/21/21 1006 03/21/21 1221 03/22/21 1652 03/22/21 1811  TROPONINIHS 87* 90* 3,248* 3,895*      Chemistry Recent Labs  Lab 03/25/21 1750 03/26/21 0500 03/27/21 0031  NA 130* 128* 129*  K 3.6 3.9 3.6  CL 103 99 98  CO2 21* 23 23  GLUCOSE 163* 130* 118*  BUN 15 17 13   CREATININE 0.93 0.85 0.80  CALCIUM 8.1* 8.2* 8.2*  GFRNONAA >60 >60 >60  ANIONGAP 6 6 8      Hematology Recent Labs  Lab 03/25/21 1700 03/26/21 0500 03/27/21 0031  WBC 15.0* 14.6* 15.0*  RBC 3.45* 3.30* 3.38*  HGB 10.3* 9.9* 10.1*  HCT 30.5* 29.5* 29.6*  MCV 88.4 89.4 87.6  MCH 29.9 30.0 29.9  MCHC 33.8 33.6 34.1  RDW 15.9* 15.9* 15.1  PLT 195 197 214    BNP Recent Labs  Lab 03/21/21 1006  BNP 252.7*     DDimer No results for input(s): DDIMER in the last 168 hours.   Radiology    DG Chest 2 View  Result Date: 03/27/2021 CLINICAL DATA:  Recent CABG.  Atelectasis. EXAM: CHEST - 2 VIEW COMPARISON:  03/26/2021 and CT chest 03/23/2021. FINDINGS: Trachea is midline. Heart is enlarged. Trace left apical pneumothorax. Epicardial pacer wires remain in place. Other support devices has been removed. Mild bibasilar subsegmental atelectasis. Right apical pleural thickening. Small bilateral pleural effusions. IMPRESSION: 1. Trace left  apical pneumothorax after chest tube removal. 2. Small bilateral pleural effusions. 3. Mild bibasilar subsegmental atelectasis. Electronically Signed   By: 05/24/2021 M.D.   On: 03/27/2021 08:20   DG Chest Port 1 View  Result Date: 03/26/2021 CLINICAL DATA:  Status post CABG. EXAM: PORTABLE CHEST 1 VIEW COMPARISON:  Chest x-ray dated 03/25/2021. FINDINGS: Heart size and mediastinal contours are stable. LEFT-sided chest tube is stable in position. RIGHT IJ Cordis remains status post Swan-Ganz catheter removal. Probable mild atelectasis and/or small pleural effusions at each lung base. Lungs otherwise clear. No pneumothorax is seen. IMPRESSION: Probable mild atelectasis and/or small pleural effusions at each lung base. No pneumothorax seen. Electronically Signed   By: 05/26/2021 M.D.   On: 03/26/2021 08:48    Cardiac Studies   LHC 03/22/21: Mid LM to Dist LM lesion is 40% stenosed. Prox LAD lesion is 90% stenosed. Mid LAD to Dist LAD lesion is 99% stenosed. Prox RCA lesion is 50% stenosed. Dist RCA lesion is 30% stenosed. Prox RCA to Mid RCA lesion is 80% stenosed. RPDA lesion is 80% stenosed. Dist LAD-1 lesion is 99% stenosed. Dist LAD-2 lesion is 90% stenosed. Prox LAD to Mid LAD lesion is 40% stenosed with 40% stenosed side branch in 1st Diag. The left ventricular ejection fraction is greater than 65% by visual estimate.   Severe multivessel CAD with significant calcification in the proximal LAD with 40% smooth proximal stenosis followed by 90% stenosis with irregularity in the region of the first diagonal takeoff.  The LAD is subtotally occluded after a very tortuous large second diagonal vessel.   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   Large dominant RCA with 30% proximal stenosis followed by 50 and 80% eccentric mid stenoses with 30% stenosis before the PDA takeoff and 80% stenosis in the PDA.  The distal RCA supplies 4  large branches which extend to the LV apex.   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   RECOMMENDATION: Angiograms will be reviewed with colleagues.  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  The Myoview study suggests significant viability to the LAD territory.  Consider surgical consultation for CABG revascularization. Diagnostic Dominance: Right      Intervention           Myoview 03/22/21:  IMPRESSION: 1. Large area of reversible ischemia involving most of the septum, distal segments of all walls and the left ventricular apex as described.   2. Abnormal left ventricular wall motion with apical hypokinesis and paradoxical motion in the septum.   3. Left ventricular ejection fraction 59%   4. Non invasive risk stratification*: High                                                                       TTE 03/22/21: IMPRESSIONS   1. LVOT gradient 43 mmHg with Valsalva. Left ventricular ejection  fraction, by estimation, is 60 to 65%. The left ventricle has normal  function. The left ventricle demonstrates regional wall motion  abnormalities (see scoring diagram/findings for  description). There is moderate asymmetric left ventricular hypertrophy of  the basal-septal segment. Left ventricular diastolic parameters are  indeterminate. There is hypokinesis of the left ventricular, basal-mid  anteroseptal wall and inferoseptal wall.   2. Right ventricular systolic function is normal. The right ventricular  size is normal.   3. The mitral valve is degenerative. Mild mitral valve regurgitation. No  evidence of mitral stenosis.   4. The aortic valve was not well visualized. Aortic valve regurgitation  is not visualized.   5. The  inferior vena cava is normal in size with greater than 50%  respiratory variability, suggesting right atrial pressure of 3 mmHg.   Comparison(s): No prior Echocardiogram.   Conclusion(s)/Recommendation(s): Focal hypokinesis at basal septum with preserved EF.      Patient Profile     80 y.o. female with history of HLD, macular degeneration, and CKD stage 3a, who presents with progressive worsening dyspnea on exertion and chest pain found to have mildly elevated BNP and troponin prompting admission to cardiology for ischemic work-up  Assessment & Plan    1. 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 HsTrop mildly elevated. She underwent a NST which showed a large area of reversible ischemia of the septum, distal segments of all walls, and LV apex. Echocardiogram showed EF 60-65%, indeterminate LV diastolic function, LV hypertorphy of the basal-septal segment with LVOT gradient 43 mmHg, hypokinesis of the LV basal-mid anteroseptal/inferoseptal wall, and mild MR. 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 She has been progressing well.  - Continue aspirin - Refusing statin. Started on zetia this admission but will need a referral to the lipid clinic for PCSK9-inhibitor - Continue B blocker - Agree with ongoing IV lasix today for post-op volume management given ongoing crackles at lung bases.   2. Post-op atrial fibrillation: started on IV>po amiodarone. Episode of atrial fibrillation with RVR early this morning from ~3:15am-5:30am and again from ~8am-~9:15am. Currently in sinus rhythm. Metoprolol tartrate increased to 25mg  BID this morning.    - Anticipate she will need anticoagulation prior to discharge given recurrent episodes  - will defer to primary team on when to initiate this as she still has a pacer wire in place.  - Continue amiodarone for rhythm control -  Continue metoprolol for rate control - Anticipate a cardiac monitor post-discharge to evaluate for recurrent atrial fibrillation and if no further episodes, could discontinue anticoagulation at that time.   3. 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  - Anticipate repeat echo outpatient to re-evaluate LVOT - Monitor volume status closely and avoid dehydration  4. HLD: LDL 180 this admission. Adamantly refusing statins which are listed in her allergies. Agreeable to zetia which was initiated this admission - Continue zetia - Will need referral to lipid clinic at discharge for PCSK9-inhibitor  For questions or updates, please contact [facility] Please consult [email or web address] for contact info under     Signed, Beatriz Stallion, PA-C  03/27/2021, 9:27 AM    History and all data above reviewed.  Patient examined.  I agree with the findings as above.  No chest pain.  Feels fatigued.  No acute SOB.  PAF today. The patient exam reveals COR:RRR  ,  Lungs: Clear  ,  Abd: Positive bowel sounds, no rebound no guarding, Ext No edema  .  All available labs, radiology testing, previous records reviewed. Agree with documented assessment and plan.  PAF:  I would suggest home with Eliquis when OK with surgery and Dr. Eldridge Dace can follow with home monitoring to see if we can discontinue this eventually.  Risk for thromboemblic events is higher with HCM, atrial fib.    Fayrene Fearing Ryson Bacha  11:43 AM  03/27/2021

## 2021-03-27 NOTE — Progress Notes (Addendum)
CARDIAC REHAB PHASE I   PRE:  Rate/Rhythm: 109 Afib  BP:  Sitting: 127/64      SaO2: 96 RA  MODE:  Ambulation: 200 ft   POST:  Rate/Rhythm: 130 Afib  BP:  Sitting: 131/49      SaO2: 96 RA  Pt tolerated exercise well and amb 200 ft w/ front wheel walker and moderate assist. Pt C/o SOB, O2 sat 96 RA. Pt to bed encouraging PLB, O2 sat 98 RA. Pt in bed w/ call bell in reach. Encouraged continued IS use and staying in the tube.  [id number]  Joya San, MS, ACM-CEP 03/27/2021 8:31 AM

## 2021-03-27 NOTE — Progress Notes (Addendum)
      [address]       [place] [address]             [phone]        3 Days Post-Op Procedure(s) (LRB): CORONARY ARTERY BYPASS GRAFTING (CABG) TIMES THREE USING LEFT INTERNAL MAMMARY ARTERY AND RIGHT GREATER SAPHENOUS VEIN HARVESTED ENDOSCOPICALLY (N/A) TRANSESOPHAGEAL ECHOCARDIOGRAM (TEE) (N/A)  Subjective: Patient sitting in chair, eating a a hard boiled egg. She has nausea this am, but no abdominal pain or vomiting.  Objective: Vital signs in last 24 hours: Temp:  [97.7 F (36.5 C)-98.9 F (37.2 C)] 97.7 F (36.5 C) (07/11 0349) Pulse Rate:  [69-102] 102 (07/11 0349) Cardiac Rhythm: Atrial fibrillation (07/11 0316) Resp:  [18-21] 20 (07/11 0349) BP: (113-135)/(52-76) 122/67 (07/11 0349) SpO2:  [95 %-100 %] 96 % (07/11 0349) Weight:  [68 kg] 68 kg (07/11 0526)  Pre op weight 61.8 kg Current Weight  03/27/21 68 kg      Intake/Output from previous day: 07/10 0701 - 07/11 0700 In: 439.5 [P.O.:280; I.V.:59.5; IV Piggyback:100] Out: 650 [Urine:650]   Physical Exam:  Cardiovascular: Slightly tachycardia Pulmonary: Diminished bibasilar breath sounds Abdomen: Soft, non tender, bowel sounds present. Extremities: Mild bilateral lower extremity edema R>L. Ecchymosis right thigh Wounds: Aquacel removed and sternal wound is clean and dry.  No erythema or signs of infection. [name] wound is clean and dry  Lab Results: CBC: Recent Labs    03/26/21 0500 03/27/21 0031  WBC 14.6* 15.0*  HGB 9.9* 10.1*  HCT 29.5* 29.6*  PLT 197 214   BMET:  Recent Labs    03/26/21 0500 03/27/21 0031  NA 128* 129*  K 3.9 3.6  CL 99 98  CO2 23 23  GLUCOSE 130* 118*  BUN 17 13  CREATININE 0.85 0.80  CALCIUM 8.2* 8.2*    PT/INR:  Lab Results  Component Value Date   INR 1.3 (H) 03/24/2021   INR 1.0 03/24/2021   ABG:  INR: Will add last result for INR, ABG once components are confirmed Will add last 4 CBG results once components are  confirmed  Assessment/Plan:  1. CV - PAF. A fib this am. On Amiodarone 400 mg bid and Lopressor 12.5 mg bid. Will increase Lopressor to 25 mg bid. If has further a fib, will need to consider anticoagulation 2.  Pulmonary - On room air. CXR this am appears to show bibasilar atelectasis, small effusions. Encourage incentive spirometer 3. Volume Overload - On Lasix 40 mg bid 4.  Expected post op acute blood loss anemia - H and H this am stable at 10.1 and 29.6 5. Supplement potassium 6. Hyponatremia post op. Sodium this am 129 7. History of CKD (stage III)-creatinine this am stable at 0.8. 8. Remove EPW 9. GI-will give scheduled Reglan. If continues with nausea, may have to decrease diet. Monitor for now  [facility] 03/27/2021,6:57 AM

## 2021-03-27 NOTE — Progress Notes (Signed)
D/c pacing wire per order. Pacing wire ends intact. Scant serous drainage on the pacing wire site. Steri strips and gauze 4x4 applied. Pt tolerance well. VSS. Will continue to monitor the pt.   Lawson Radar, RN

## 2021-03-27 NOTE — Progress Notes (Signed)
Initial Nutrition Assessment  DOCUMENTATION CODES:   Not applicable  INTERVENTION:   Ensure Enlive po BID, each supplement provides 350 kcal and 20 grams of protein MVI with minerals daily  NUTRITION DIAGNOSIS:   Increased nutrient needs related to post-op healing as evidenced by estimated needs.  GOAL:   Patient will meet greater than or equal to 90% of their needs  MONITOR:   PO intake, Supplement acceptance  REASON FOR ASSESSMENT:   Consult Assessment of nutrition requirement/status  ASSESSMENT:   80 yo female admitted with chest pain. S/P TEE and CABG x 3 on 7/8. PMH includes HLD, macular degeneration, CKD stage 3a.  Patient reports good intake at home PTA. She ate 3 meals and snacks between meals at home. Since her surgery on 7/8, she has been nauseous and poor appetite. She received a boiled egg for breakfast yesterday and today because that's all she felt like she could eat. Appetite and nausea has improved today and she feels like she will be able to eat better now. Discussed the importance of adequate protein and calorie intake to support healing and recovery. She agreed to try Ensure Enlive/Plus BID between meals.   Currently on a heart healthy diet. Meal completions: 0-50%  Labs reviewed. Na 129 CBG: 111-136  Medications reviewed and include colace, lasix, reglan, protonix, klor-con.  Weight history reviewed. No significant weight changes noted.   NUTRITION - FOCUSED PHYSICAL EXAM:  Flowsheet Row Most Recent Value  Orbital Region No depletion  Upper Arm Region No depletion  Thoracic and Lumbar Region No depletion  Buccal Region No depletion  Temple Region No depletion  Clavicle Bone Region No depletion  Clavicle and Acromion Bone Region Mild depletion  Scapular Bone Region No depletion  Dorsal Hand Mild depletion  Patellar Region No depletion  Anterior Thigh Region No depletion  Posterior Calf Region No depletion  Edema (RD Assessment) None  Hair  Reviewed  Eyes Reviewed  Mouth Reviewed  Skin Reviewed  Nails Reviewed       Diet Order:   Diet Order             Diet Heart Room service appropriate? Yes; Fluid consistency: Thin  Diet effective now                   EDUCATION NEEDS:   Education needs have been addressed  Skin:  Skin Assessment: Reviewed RN Assessment (surgical incisions to R leg and chest)  Last BM:  7/7  Height:   Ht Readings from Last 1 Encounters:  03/21/21 5\' 3"  (1.6 m)    Weight:   Wt Readings from Last 1 Encounters:  03/27/21 68 kg    Ideal Body Weight:  52.3 kg  BMI:  Body mass index is 26.57 kg/m.  Estimated Nutritional Needs:   Kcal:  1600-1800  Protein:  80-95 gm  Fluid:  >/= 1.6 L    05/28/21, RD, LDN, CNSC Please refer to Amion for contact information.

## 2021-03-27 NOTE — Progress Notes (Signed)
Mobility Specialist: Progress Note   03/27/21 1203  Mobility  Activity Ambulated in hall  Level of Assistance Standby assist, set-up cues, supervision of patient - no hands on  Assistive Device Front wheel walker  Distance Ambulated (ft) 220 ft  Mobility Ambulated with assistance in hallway  Mobility Response Tolerated well  Mobility performed by Mobility specialist  Bed Position Chair  $Mobility charge 1 Mobility   Pre-Mobility: 89 HR, 117/70 BP Post-Mobility: 91 HR, 128/68 BP  Pt c/o feeling fatigued halfway through ambulation, otherwise asx. Pt back recliner after walk with call bell and phone within reach.   [facility] Kimberly Calhoun Mobility Specialist Mobility Specialist Phone: [phone]

## 2021-03-27 NOTE — Progress Notes (Signed)
Patient sleeping and Central Monitoring called to notify me of Patient in At. Fib. Rate in 90-110 R.N.aware . Cont. To monitor patient and rhythm.

## 2021-03-27 NOTE — Care Management Important Message (Signed)
Important Message  Patient Details  Name: Kimberly Calhoun MRN: [id number] Date of Birth: 27-Nov-1940   Medicare Important Message Given:  Yes     Renie Ora 03/27/2021, 9:57 AM

## 2021-03-28 ENCOUNTER — Other Ambulatory Visit (HOSPITAL_COMMUNITY): Payer: Self-pay

## 2021-03-28 ENCOUNTER — Inpatient Hospital Stay (HOSPITAL_COMMUNITY): Payer: Medicare Other

## 2021-03-28 LAB — BASIC METABOLIC PANEL
Anion gap: 7 (ref 5–15)
BUN: 14 mg/dL (ref 8–23)
CO2: 28 mmol/L (ref 22–32)
Calcium: 8.7 mg/dL — ABNORMAL LOW (ref 8.9–10.3)
Chloride: 99 mmol/L (ref 98–111)
Creatinine, Ser: 0.84 mg/dL (ref 0.44–1.00)
GFR, Estimated: >60 mL/min (ref >60)
Glucose, Bld: 95 mg/dL (ref 70–99)
Potassium: 4 mmol/L (ref 3.5–5.1)
Sodium: 134 mmol/L — ABNORMAL LOW (ref 135–145)

## 2021-03-28 LAB — CBC
HCT: 30.3 % — ABNORMAL LOW (ref 36.0–46.0)
Hemoglobin: 10.1 g/dL — ABNORMAL LOW (ref 12.0–15.0)
MCH: 29.5 pg (ref 26.0–34.0)
MCHC: 33.3 g/dL (ref 30.0–36.0)
MCV: 88.6 fL (ref 80.0–100.0)
Platelets: 301 10*3/uL (ref 150–400)
RBC: 3.42 MIL/uL — ABNORMAL LOW (ref 3.87–5.11)
RDW: 14.9 % (ref 11.5–15.5)
WBC: 12.5 10*3/uL — ABNORMAL HIGH (ref 4.0–10.5)
nRBC: 0 % (ref 0.0–0.2)

## 2021-03-28 LAB — GLUCOSE, CAPILLARY
Glucose-Capillary: 149 mg/dL — ABNORMAL HIGH (ref 70–99)
Glucose-Capillary: 145 mg/dL — ABNORMAL HIGH (ref 70–99)
Glucose-Capillary: 110 mg/dL — ABNORMAL HIGH (ref 70–99)

## 2021-03-28 LAB — MAGNESIUM: Magnesium: 1.9 mg/dL (ref 1.7–2.4)

## 2021-03-28 MED ORDER — LOPERAMIDE HCL 2 MG PO CAPS: 2.0000 mg | ORAL_CAPSULE | ORAL | Status: DC | PRN

## 2021-03-28 MED ORDER — METOPROLOL TARTRATE 25 MG PO TABS
37.5000 mg | ORAL_TABLET | Freq: Two times a day (BID) | ORAL | Status: DC
Start: 2021-03-28 — End: 2021-03-30
  Administered 2021-03-28 – 2021-03-30 (×4): 37.5 mg via ORAL
  Filled 2021-03-28 (×4): qty 1

## 2021-03-28 MED ORDER — APIXABAN 5 MG PO TABS
5.0000 mg | ORAL_TABLET | Freq: Two times a day (BID) | ORAL | Status: DC
  Administered 2021-03-28 – 2021-03-30 (×5): 5 mg via ORAL
  Filled 2021-03-28 (×4): qty 1

## 2021-03-28 MED ORDER — METOCLOPRAMIDE HCL 5 MG/ML IJ SOLN: 10.0000 mg | Freq: Four times a day (QID) | INTRAMUSCULAR | Status: DC

## 2021-03-28 MED ORDER — VITAMIN E 180 MG (400 UNIT) PO CAPS: 400.0000 [IU] | ORAL_CAPSULE | Freq: Every morning | ORAL | Status: AC

## 2021-03-28 MED ORDER — METOPROLOL TARTRATE 12.5 MG HALF TABLET
12.5000 mg | ORAL_TABLET | Freq: Once | ORAL | Status: AC
  Administered 2021-03-28: 12.5 mg via ORAL

## 2021-03-28 MED ORDER — ASPIRIN EC 81 MG PO TBEC
81.0000 mg | DELAYED_RELEASE_TABLET | Freq: Every day | ORAL | Status: DC
  Administered 2021-03-28 – 2021-03-30 (×3): 81 mg via ORAL
  Filled 2021-03-28 (×3): qty 1

## 2021-03-28 MED ORDER — AMIODARONE IV BOLUS ONLY 150 MG/100ML
150.0000 mg | Freq: Once | INTRAVENOUS | Status: AC
  Administered 2021-03-28: 150 mg via INTRAVENOUS
  Filled 2021-03-28: qty 100

## 2021-03-28 NOTE — TOC Benefit Eligibility Note (Signed)
Patient Advocate Encounter  Insurance verification completed.    The patient is currently admitted and upon discharge could be taking Eliquis 5 mg.  The current 30 day co-pay is, $47.00.   The patient is insured through [employer] Medicare Part D    Raquon Milledge, CPhT Pharmacy Patient Advocate Specialist [place] [facility]wardship Team Direct Number: [phone]  Fax: [phone]        

## 2021-03-28 NOTE — Progress Notes (Signed)
CARDIAC REHAB PHASE I   PRE:  Rate/Rhythm: 84 SR  BP:  Sitting: 123/66      SaO2: 97 RA  MODE:  Ambulation: 470 ft   POST:  Rate/Rhythm: 97 SR  BP:  Sitting: 125/64      SaO2: 96 RA  Pt tried to use arms when getting up. Gave staying in the tube cues when getting up. Daughter told me she would keep giving her sternal precaution cues. Pt tolerated exercise well and amb 470 ft w/ front wheel walker and standby assist. Pt looked less fatigue then when I first saw her. Pt C/O SOB during the walk. Check O2 sat, 98 RA. Pt to recliner after walk w/ call bell in reach. Encouraged pt to stay in the tube and IS use. Asked pt how many tines she should used IS. She said 10 per hour.  [phone]  Joya San, MS, ACM-CEP 03/28/2021 11:41 AM

## 2021-03-28 NOTE — Op Note (Signed)
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  Lorayne Marek, MD  Assistant: Jacques Earthly, PA-C  Anesthesia: General  Operative Findings: Preserved left ventricular systolic function Good quality left internal mammary artery conduit Good quality saphenous vein conduit Good quality target vessels for grafting    BRIEF CLINICAL NOTE AND INDICATIONS FOR SURGERY  80 year old lady presented to the hospital with chest pain.  She was found to have nonspecific EKG changes.  Her highly sensitive troponin was mildly elevated.  She underwent a nuclear stress test which was positive.  This was followed by left heart catheterization demonstrating multivessel coronary artery disease.  She is referred for CABG.  She has been thoroughly evaluated and considered a good candidate for the procedure.  She now presents for CABG   DETAILS OF THE OPERATIVE PROCEDURE  Preparation:  The patient is brought to the operating room on the above mentioned date and central monitoring was established by the anesthesia team including placement of Swan-Ganz catheter and radial arterial line. The patient is placed in the supine position on the operating table.  Intravenous antibiotics are administered. General endotracheal anesthesia is induced uneventfully. A Foley catheter is placed.  Baseline transesophageal echocardiogram was performed.  Findings were notable for preserved LV function and LV hypertrophy  The patient's chest, abdomen, both groins, and both lower extremities are  prepared and draped in a sterile manner. A time out procedure is performed.   Surgical Approach and Conduit Harvest:  A median sternotomy incision was performed and the left internal mammary artery is dissected from the chest wall and prepared for bypass grafting. The left internal mammary artery is notably good quality conduit. Simultaneously, the greater saphenous vein is obtained from the patient's right thigh using endoscopic vein harvest technique. The saphenous vein is notably good quality conduit. After removal of the saphenous vein, the small surgical incisions in the lower extremity are closed with absorbable suture. Following systemic heparinization, the left internal mammary artery was transected distally noted to have excellent flow.   Extracorporeal Cardiopulmonary Bypass and Myocardial Protection:  The pericardium is opened. The ascending aorta is nondiseased in appearance. The ascending aorta and the right atrium are cannulated for cardiopulmonary bypass.  Adequate heparinization is verified.    The entire pre-bypass portion of the operation was notable for stable hemodynamics.  Cardiopulmonary bypass was begun and the surface of the heart is inspected. Distal target vessels are selected for coronary artery bypass grafting. A cardioplegia cannula is placed in the ascending aorta.    The patient is allowed to cool passively to 34C systemic temperature.  The aortic cross clamp is applied and cold blood cardioplegia is delivered initially in an antegrade fashion through the aortic root.    Iced saline slush is applied for topical hypothermia.  The initial cardioplegic arrest is rapid with early diastolic arrest.  Repeat doses of cardioplegia are administered intermittently throughout the entire cross clamp portion of the operation through the aortic root and through subsequently placed vein grafts in order to maintain completely flat electrocardiogram.   Coronary Artery Bypass  Grafting:  The  posterior descending branch of  the right coronary artery was grafted using a reversed saphenous vein graft in an end-to-side fashion.  At the site of distal anastomosis the target vessel was good quality and measured approximately 1.5 mm in diameter. The  diagonal branch of the left anterior descending coronary artery was grafted using a reversed saphenous vein graft in an end-to-side fashion.  At the site of distal anastomosis the target vessel was good quality and measured approximately 1.5 mm in diameter. The distal left anterior coronary artery was grafted with the left internal mammary artery in an end-to-side fashion.  At the site of distal anastomosis the target vessel was good quality and measured approximately 1.5 mm in diameter. Anastomotic patency and runoff was confirmed with indocyanine green fluorescence imaging (SPY).   All proximal vein graft anastomoses were placed directly to the ascending aorta prior to removal of the aortic cross clamp.  A reanimation dose of cardioplegia was given down the aortic root and aortic clamp was removed after de-airing procedures have been performed.   Procedure Completion:  All proximal and distal coronary anastomoses were inspected for hemostasis and appropriate graft orientation. Epicardial pacing wires are fixed to the right ventricular outflow tract and to the right atrial appendage. The patient is rewarmed to 37C temperature. The patient is weaned and disconnected from cardiopulmonary bypass.  The patient's rhythm at separation from bypass was sinus bradycardia.  The patient was weaned from cardiopulmonary bypass  without any inotropic support.   Followup transesophageal echocardiogram performed after separation from bypass revealed  no changes from the preoperative exam.  The aortic and venous cannula were removed uneventfully. Protamine was administered to reverse the anticoagulation. The mediastinum and pleural space were  inspected for hemostasis and irrigated with saline solution. The mediastinum and left pleural space were drained using fluted chest tubes placed through separate stab incisions inferiorly.  The soft tissues anterior to the aorta were reapproximated loosely. The sternum is closed with double strength sternal wire. The soft tissues anterior to the sternum were closed in multiple layers and the skin is closed with a running subcuticular skin closure.  The post-bypass portion of the operation was notable for stable rhythm and hemodynamics.     Disposition:  The patient tolerated the procedure well and is transported to the surgical intensive care in stable condition. There are no intraoperative complications. All sponge instrument and needle counts are verified correct at completion of the operation.    Brantley Fling, MD 03/28/2021 12:44 PM

## 2021-03-28 NOTE — Progress Notes (Addendum)
      [address]       [place] [address]             [phone]        4 Days Post-Op Procedure(s) (LRB): CORONARY ARTERY BYPASS GRAFTING (CABG) TIMES THREE USING LEFT INTERNAL MAMMARY ARTERY AND RIGHT GREATER SAPHENOUS VEIN HARVESTED ENDOSCOPICALLY (N/A) TRANSESOPHAGEAL ECHOCARDIOGRAM (TEE) (N/A)  Subjective: Nausea resolved with Reglan. She states she has a pain under breast/upper abdomen once in awhile when takes a deep breath  Objective: Vital signs in last 24 hours: Temp:  [97.8 F (36.6 C)-98.3 F (36.8 C)] 98.1 F (36.7 C) (07/12 0433) Pulse Rate:  [81-119] 92 (07/12 0433) Cardiac Rhythm: Normal sinus rhythm (07/11 2345) Resp:  [17-20] 17 (07/12 0433) BP: (115-140)/(63-79) 128/70 (07/12 0433) SpO2:  [93 %-97 %] 93 % (07/12 0433) Weight:  [64.9 kg] 64.9 kg (07/12 0446)  Pre op weight 61.8 kg Current Weight  03/28/21 64.9 kg      Intake/Output from previous day: No intake/output data recorded.   Physical Exam:  Cardiovascular: SR Pulmonary:Slightly diminished bibasilar breath sounds Abdomen: Soft, non tender, bowel sounds present. Extremities: Mild bilateral lower extremity edema R>L. Ecchymosis right thigh Wounds: Sternal wound is clean and dry.  No erythema or signs of infection. [name] wound is clean and dry  Lab Results: CBC: Recent Labs    03/27/21 0031 03/28/21 0233  WBC 15.0* 12.5*  HGB 10.1* 10.1*  HCT 29.6* 30.3*  PLT 214 301    BMET:  Recent Labs    03/27/21 0031 03/28/21 0233  NA 129* 134*  K 3.6 4.0  CL 98 99  CO2 23 28  GLUCOSE 118* 95  BUN 13 14  CREATININE 0.80 0.84  CALCIUM 8.2* 8.7*     PT/INR:  Lab Results  Component Value Date   INR 1.3 (H) 03/24/2021   INR 1.0 03/24/2021   ABG:  INR: Will add last result for INR, ABG once components are confirmed Will add last 4 CBG results once components are confirmed  Assessment/Plan:  1. CV - Possible HCOM. PAF. A fib this am. On Amiodarone 400 mg  bid and Lopressor 25 mg bid. Will start Apixaban, as discussed with Dr. Vickey Sages. 2.  Pulmonary - On room air. Will re check CXR as with small left apical pneumothorax.  Encourage incentive spirometer 3. Volume Overload - On Lasix 40 mg bid 4.  Expected post op acute blood loss anemia - H and H this am stable at 10.1 and 30.3 5. Hyponatremia post op. Sodium this am up to 134 6. History of CKD (stage III)-creatinine this am stable at 0.8. 7. GI-will continue scheduled Reglan.  8. Possible discharge 1-2 days  Donielle M ZimmermanPA-C 03/28/2021,7:06 AM  Pt seen and examined; agree with documentation.  Sheddrick Lattanzio Z. Vickey Sages, MD [phone]

## 2021-03-28 NOTE — Progress Notes (Signed)
ANTICOAGULATION CONSULT NOTE - Initial Consult  Pharmacy Consult for apixaban Indication: atrial fibrillation  Allergies  Allergen Reactions   Macrobid [Nitrofurantoin] Other (See Comments)    Cold chills, muscle weakness.   Mold Extract [Trichophyton] Other (See Comments)    Allergies.    Statins Other (See Comments)   Other Other (See Comments)    Allergies.   Mold in Cheese - stuffy nose and headache  Chocolate- stuffy nose and headache  Mildew - stuffy nose and headache    Patient Measurements: Height: 5\' 3"  (160 cm) Weight: 64.9 kg (143 lb 1.6 oz) IBW/kg (Calculated) : 52.4 Heparin Dosing Weight: 63.2 kg   Vital Signs: Temp: 98.4 F (36.9 C) (07/12 0756) Temp Source: Oral (07/12 0756) BP: 125/85 (07/12 0756) Pulse Rate: 97 (07/12 0756)  Labs: Recent Labs    03/26/21 0500 03/27/21 0031 03/28/21 0233  HGB 9.9* 10.1* 10.1*  HCT 29.5* 29.6* 30.3*  PLT 197 214 301  CREATININE 0.85 0.80 0.84    Estimated Creatinine Clearance: 47.6 mL/min (by C-G formula based on SCr of 0.84 mg/dL).   Medical History: Past Medical History:  Diagnosis Date   Arthritis    Chronic kidney disease, stage 3 (HCC)    Headache    Hypercholesteremia    Hyperlipidemia    Osteopenia    Personal history of colonic polyps     Medications:  Scheduled:   acetaminophen  1,000 mg Oral Q6H   amiodarone  400 mg Oral Daily   aspirin EC  81 mg Oral Daily   bisacodyl  10 mg Oral Daily   Or   bisacodyl  10 mg Rectal Daily   Chlorhexidine Gluconate Cloth  6 each Topical Q0600   colchicine  0.3 mg Oral BID   docusate sodium  200 mg Oral Daily   enoxaparin (LOVENOX) injection  30 mg Subcutaneous QHS   ezetimibe  10 mg Oral Daily   feeding supplement  237 mL Oral BID BM   furosemide  40 mg Oral BID   loratadine  10 mg Oral Daily   metoprolol tartrate  25 mg Oral BID   multivitamin with minerals  1 tablet Oral Daily   mupirocin ointment  1 application Nasal BID   pantoprazole  40 mg  Oral Daily   sodium chloride flush  3 mL Intravenous Q12H    Assessment: 79 yof who underwent CABG - no AC PTA. Now found to be in Afib post-op.  Plan to start on apixaban - given age>80 but Scr <1.5 and weight has been >60 kg, qualifies for 5 mg BID dosing. Hgb 10.1, plt 301. No s/sx of bleeding.   Goal of Therapy:  Monitor platelets by anticoagulation protocol: Yes   Plan:  Start apixaban 5 mg BID  Monitor daily CBC, for s/sx of bleeding - watch wt given borderline for dose adjustment   05/29/21, PharmD, BCCCP Clinical Pharmacist  Phone: [phone] 03/28/2021 9:21 AM  Please check AMION for all [facility] Pharmacy phone numbers After 10:00 PM, call Main Pharmacy [phone]

## 2021-03-28 NOTE — Progress Notes (Signed)
Approximately around 9 am, pt's HR went up to 140s with afib rhythm. Pt asymtomatic. PA notified. Amio bolus x1 ordered. Will continue to monitor the pt.   Lawson Radar, RN

## 2021-03-28 NOTE — Progress Notes (Addendum)
Progress Note  Patient Name: Kimberly Calhoun Date of Encounter: 03/28/2021  [facility] Cardiologist: Lance Muss, MD   Subjective   Feeling better today. Breathing is improved. No complaints of chest pain. Remains unaware of her paroxysmal atrial fibrillation.  Inpatient Medications    Scheduled Meds:  acetaminophen  1,000 mg Oral Q6H   amiodarone  400 mg Oral Daily   aspirin EC  81 mg Oral Daily   bisacodyl  10 mg Oral Daily   Or   bisacodyl  10 mg Rectal Daily   Chlorhexidine Gluconate Cloth  6 each Topical Q0600   colchicine  0.3 mg Oral BID   docusate sodium  200 mg Oral Daily   enoxaparin (LOVENOX) injection  30 mg Subcutaneous QHS   ezetimibe  10 mg Oral Daily   feeding supplement  237 mL Oral BID BM   furosemide  40 mg Oral BID   loratadine  10 mg Oral Daily   metoCLOPramide (REGLAN) injection  10 mg Intravenous Q6H   metoprolol tartrate  25 mg Oral BID   multivitamin with minerals  1 tablet Oral Daily   mupirocin ointment  1 application Nasal BID   pantoprazole  40 mg Oral Daily   sodium chloride flush  3 mL Intravenous Q12H   Continuous Infusions:  sodium chloride     lactated ringers     lactated ringers Stopped (03/25/21 1121)   PRN Meds: levalbuterol, metoprolol tartrate, morphine injection, ondansetron (ZOFRAN) IV, oxyCODONE, sodium chloride flush, traMADol   Vital Signs    Vitals:   03/28/21 0013 03/28/21 0433 03/28/21 0446 03/28/21 0756  BP: 132/74 128/70  125/85  Pulse: 88 92  97  Resp: 19 17  17   Temp: 97.9 F (36.6 C) 98.1 F (36.7 C)  98.4 F (36.9 C)  TempSrc: Oral Oral  Oral  SpO2: 94% 93%  98%  Weight:   64.9 kg   Height:       No intake or output data in the 24 hours ending 03/28/21 0800 Last 3 Weights 03/28/2021 03/27/2021 03/26/2021  Weight (lbs) 143 lb 1.6 oz 150 lb 153 lb  Weight (kg) 64.91 kg 68.04 kg 69.4 kg      Telemetry    Increased frequency of atrial fibrillation overnight - persistent this morning with  rates in the 110s - Personally Reviewed  ECG    No new tracings - Personally Reviewed  Physical Exam   GEN: No acute distress.   Neck: No JVD Cardiac: IRIR, no murmurs, rubs, or gallops.  Respiratory: Clear to auscultation bilaterally. GI: Soft, nontender, non-distended  MS: No edema; No deformity. Neuro:  Nonfocal  Psych: Normal affect   Labs    High Sensitivity Troponin:   Recent Labs  Lab 03/21/21 1006 03/21/21 1221 03/22/21 1652 03/22/21 1811  TROPONINIHS 87* 90* 3,248* 3,895*      Chemistry Recent Labs  Lab 03/26/21 0500 03/27/21 0031 03/28/21 0233  NA 128* 129* 134*  K 3.9 3.6 4.0  CL 99 98 99  CO2 23 23 28   GLUCOSE 130* 118* 95  BUN 17 13 14   CREATININE 0.85 0.80 0.84  CALCIUM 8.2* 8.2* 8.7*  GFRNONAA >60 >60 >60  ANIONGAP 6 8 7      Hematology Recent Labs  Lab 03/26/21 0500 03/27/21 0031 03/28/21 0233  WBC 14.6* 15.0* 12.5*  RBC 3.30* 3.38* 3.42*  HGB 9.9* 10.1* 10.1*  HCT 29.5* 29.6* 30.3*  MCV 89.4 87.6 88.6  MCH 30.0 29.9 29.5  MCHC 33.6 34.1 33.3  RDW 15.9* 15.1 14.9  PLT 197 214 301    BNP Recent Labs  Lab 03/21/21 1006  BNP 252.7*     DDimer No results for input(s): DDIMER in the last 168 hours.   Radiology    DG Chest 2 View  Result Date: 03/27/2021 CLINICAL DATA:  Recent CABG.  Atelectasis. EXAM: CHEST - 2 VIEW COMPARISON:  03/26/2021 and CT chest 03/23/2021. FINDINGS: Trachea is midline. Heart is enlarged. Trace left apical pneumothorax. Epicardial pacer wires remain in place. Other support devices has been removed. Mild bibasilar subsegmental atelectasis. Right apical pleural thickening. Small bilateral pleural effusions. IMPRESSION: 1. Trace left apical pneumothorax after chest tube removal. 2. Small bilateral pleural effusions. 3. Mild bibasilar subsegmental atelectasis. Electronically Signed   By: Leanna Battles M.D.   On: 03/27/2021 08:20    Cardiac Studies   LHC 03/22/21: Mid LM to Dist LM lesion is 40%  stenosed. Prox LAD lesion is 90% stenosed. Mid LAD to Dist LAD lesion is 99% stenosed. Prox RCA lesion is 50% stenosed. Dist RCA lesion is 30% stenosed. Prox RCA to Mid RCA lesion is 80% stenosed. RPDA lesion is 80% stenosed. Dist LAD-1 lesion is 99% stenosed. Dist LAD-2 lesion is 90% stenosed. Prox LAD to Mid LAD lesion is 40% stenosed with 40% stenosed side branch in 1st Diag. The left ventricular ejection fraction is greater than 65% by visual estimate.   Severe multivessel CAD with significant calcification in the proximal LAD with 40% smooth proximal stenosis followed by 90% stenosis with irregularity in the region of the first diagonal takeoff.  The LAD is subtotally occluded after a very tortuous large second diagonal vessel.   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   Large dominant RCA with 30% proximal stenosis followed by 50 and 80% eccentric mid stenoses with 30% stenosis before the PDA takeoff and 80% stenosis in the PDA.  The distal RCA supplies 4 large branches which extend to the LV apex.   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   RECOMMENDATION: Angiograms will be reviewed with colleagues.  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  The Myoview study suggests significant viability to the LAD territory.  Consider surgical consultation for CABG revascularization. Diagnostic Dominance: Right      Intervention           Myoview 03/22/21: IMPRESSION: 1. Large area of reversible ischemia involving most of the septum, distal segments of all walls and the left ventricular apex as described.   2. Abnormal left ventricular wall motion with apical hypokinesis  and paradoxical motion in the septum.   3. Left ventricular ejection fraction 59%   4. Non invasive risk stratification*: High                                                                       TTE 03/22/21: IMPRESSIONS   1. LVOT gradient 43 mmHg with Valsalva. Left ventricular ejection  fraction, by estimation, is 60 to  65%. The left ventricle has normal  function. The left ventricle demonstrates regional wall motion  abnormalities (see scoring diagram/findings for  description). There is moderate asymmetric left ventricular hypertrophy of  the basal-septal segment. Left ventricular diastolic parameters are  indeterminate. There is hypokinesis of the left ventricular, basal-mid  anteroseptal wall and inferoseptal wall.   2. Right ventricular systolic function is normal. The right ventricular  size is normal.   3. The mitral valve is degenerative. Mild mitral valve regurgitation. No  evidence of mitral stenosis.   4. The aortic valve was not well visualized. Aortic valve regurgitation  is not visualized.   5. The inferior vena cava is normal in size with greater than 50%  respiratory variability, suggesting right atrial pressure of 3 mmHg.   Comparison(s): No prior Echocardiogram.   Conclusion(s)/Recommendation(s): Focal hypokinesis at basal septum with preserved EF.        Patient Profile     80 y.o. female with history of HLD, macular degeneration, and CKD stage 3a, who presents with progressive worsening dyspnea on exertion and chest pain found to have mildly elevated BNP and troponin prompting admission to cardiology for ischemic work-up  Assessment & Plan    1. 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 HsTrop mildly elevated. She underwent a NST which showed a large area of reversible ischemia of the septum, distal segments of all walls, and LV apex. Echocardiogram showed EF 60-65%, indeterminate LV diastolic function, LV hypertorphy of the  basal-septal segment with LVOT gradient 43 mmHg, hypokinesis of the LV basal-mid anteroseptal/inferoseptal wall, and mild MR. 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 She has been progressing well. - Continue aspirin - Refusing statin. Started on zetia this admission but will need a referral to the lipid clinic for PCSK9-inhibitor - Continue B blocker - Continue lasix 40mg  BID - may be able to de-escalate to once daily starting tomorrow.    2. Post-op atrial fibrillation: started on IV>po amiodarone. Paroxysmal episodes this admission, persistent this morning with rates in the 110s-120s - Would benefit from anticoagulation at this point - recommend eliquis 5mg  BID.  - Continue amiodarone for rhythm control - 7 day course of 400mg  BID, then 200mg  BID x7 days, then 200mg  daily - Continue metoprolol for rate control - will increase to 37.5mg  BID today for improved rate control - Anticipate a cardiac monitor post-discharge to evaluate for recurrent atrial fibrillation and if no further episodes, could discontinue anticoagulation at that time.    3. 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 - Anticipate repeat echo outpatient to re-evaluate LVOT - Monitor volume status closely and avoid dehydration   4. HLD: LDL 180 this admission. Adamantly refusing statins which are listed in her allergies. Agreeable to zetia which was initiated this admission - Continue zetia - Will need referral to lipid clinic at discharge for PCSK9-inhibitor  For questions or updates, please contact [facility] Please consult [email or web address] for contact info under    Signed, , PA-C  03/28/2021,  8:00 AM    History and all data above reviewed.  Patient examined.  I agree with the findings as above.  Ambulating in  room.  Feels "OK."  PAF with increased rate earlier today but currently in NSR with PACs.  The patient exam reveals COR:RRR  ,  Lungs: Clear  ,  Abd: Positive bowel sounds, no rebound no guarding, Ext   Mild right greater than left leg edema  .  All available labs, radiology testing, previous records reviewed. Agree with documented assessment and plan.   PAF:  OK to continue meds as listed.   Add DOAC.  As an out patient we will Dr. Eldridge DaceVaranasi decide when to discontinue amiodarone and when/if to stop warfarin if no recurrent fib as she gets further from surgery.    Fayrene FearingJames Dashon Mcintire  11:18 AM  03/28/2021

## 2021-03-28 NOTE — Progress Notes (Signed)
Mobility Specialist: Progress Note   03/28/21 1409  Mobility  Activity Ambulated in hall  Level of Assistance Independent  Assistive Device Front wheel walker  Distance Ambulated (ft) 470 ft  Mobility Ambulated with assistance in hallway  Mobility Response Tolerated well  Mobility performed by Mobility specialist  $Mobility charge 1 Mobility   Post-Mobility: 95 HR  Pt asx during ambulation. Pt to bed after walk with call bell and phone at her side.   [facility] Carleena Mires Mobility Specialist Mobility Specialist Phone: [phone]

## 2021-03-29 MED ORDER — FUROSEMIDE 40 MG PO TABS
40.0000 mg | ORAL_TABLET | Freq: Every day | ORAL | Status: DC
  Administered 2021-03-29 – 2021-03-30 (×2): 40 mg via ORAL
  Filled 2021-03-29 (×2): qty 1

## 2021-03-29 MED ORDER — POTASSIUM CHLORIDE CRYS ER 20 MEQ PO TBCR
20.0000 meq | EXTENDED_RELEASE_TABLET | Freq: Every day | ORAL | Status: DC
  Administered 2021-03-29 – 2021-03-30 (×2): 20 meq via ORAL
  Filled 2021-03-29 (×2): qty 1

## 2021-03-29 NOTE — Progress Notes (Signed)
Mobility Specialist: Progress Note   03/29/21 1724  Mobility  Activity Ambulated in hall  Level of Assistance Independent  Assistive Device None  Distance Ambulated (ft) 470 ft  Mobility Ambulated independently in hallway  Mobility Response Tolerated well  Mobility performed by Mobility specialist  Bed Position Chair  $Mobility charge 1 Mobility   Pt asx throughout ambulation. Pt back to recliner after walk to eat dinner. Pt has call bell and phone within reach.   [facility] Shan Padgett Mobility Specialist Mobility Specialist Phone: [phone]

## 2021-03-29 NOTE — TOC Benefit Eligibility Note (Signed)
Transition of Care [facility]) Benefit Eligibility Note    Patient Details  Name: Kimberly Calhoun MRN: [id number] Date of Birth: 1940-10-24   Medication/Dose: ELIQUIS  5 MG BID  Covered?: Yes  Tier: 3 Drug  Prescription Coverage Preferred Pharmacy: [facility]   Roseanne Kaufman with Person/Company/Phone Number:: LOVELY  @  [employer] #  [phone]  Co-Pay: $47.00  Prior Approval: No  Deductible: Met (OUT-OF-POCKET:UNMET)  Additional Notes: APIXABAN: Crecencio Mc Phone Number: 03/29/2021, 12:22 PM

## 2021-03-29 NOTE — Progress Notes (Signed)
      [address]       [place] [address]             [phone]        5 Days Post-Op Procedure(s) (LRB): CORONARY ARTERY BYPASS GRAFTING (CABG) TIMES THREE USING LEFT INTERNAL MAMMARY ARTERY AND RIGHT GREATER SAPHENOUS VEIN HARVESTED ENDOSCOPICALLY (N/A) TRANSESOPHAGEAL ECHOCARDIOGRAM (TEE) (N/A)  Subjective: No specific complaint this am. She states she had a coughing spell last night when a fib rate increased.  Objective: Vital signs in last 24 hours: Temp:  [98.2 F (36.8 C)-98.7 F (37.1 C)] 98.3 F (36.8 C) (07/13 0338) Pulse Rate:  [82-97] 82 (07/13 0338) Cardiac Rhythm: Normal sinus rhythm (07/13 0350) Resp:  [17-20] 20 (07/13 0338) BP: (112-133)/(64-85) 129/65 (07/13 0338) SpO2:  [94 %-98 %] 98 % (07/13 0338) Weight:  [63.6 kg] 63.6 kg (07/13 0338)  Pre op weight 61.8 kg Current Weight  03/29/21 63.6 kg      Intake/Output from previous day: 07/12 0701 - 07/13 0700 In: 240 [P.O.:240] Out: -    Physical Exam:  Cardiovascular: RRR Pulmonary:Mostly clear Abdomen: Soft, non tender, bowel sounds present. Extremities: Mild bilateral lower extremity edema R>L but is decreasing. Ecchymosis right thigh Wounds: Sternal wound is clean and dry.  No erythema or signs of infection. [name] wound is clean and dry  Lab Results: CBC: Recent Labs    03/27/21 0031 03/28/21 0233  WBC 15.0* 12.5*  HGB 10.1* 10.1*  HCT 29.6* 30.3*  PLT 214 301    BMET:  Recent Labs    03/27/21 0031 03/28/21 0233  NA 129* 134*  K 3.6 4.0  CL 98 99  CO2 23 28  GLUCOSE 118* 95  BUN 13 14  CREATININE 0.80 0.84  CALCIUM 8.2* 8.7*     PT/INR:  Lab Results  Component Value Date   INR 1.3 (H) 03/24/2021   INR 1.0 03/24/2021   ABG:  INR: Will add last result for INR, ABG once components are confirmed Will add last 4 CBG results once components are confirmed  Assessment/Plan:  1. CV - Possible HCOM. PAF. When she is in a fib rates can be above 130+.  On Amiodarone 400 mg bid, Lopressor 37.5 mg bid, and Apixaban. With BP, unlikely to be able to titrate Lopressor anymore. Will give Amiodarone bolus if rate increased again.  2.  Pulmonary - On room air. Encourage incentive spirometer 3. Volume Overload - On Lasix 40 mg bid. I doubt in and outs are accurate. Will decrease Lasix to daily 4.  Expected post op acute blood loss anemia - H and H this am stable at 10.1 and 30.3 5. Hyponatremia post op. Sodium this am up to 134 6. History of CKD (stage III)-creatinine this am stable at 0.8. 7. Will consider discharge once a fib rate better controlled  Kimberly Calhoun 03/29/2021,7:12 AM

## 2021-03-29 NOTE — Progress Notes (Signed)
CARDIAC REHAB PHASE I   PRE:  Rate/Rhythm: 97 SR    BP: sitting 122/64    SaO2: 95 RA  MODE:  Ambulation: 470 ft   POST:  Rate/Rhythm: 125 ST with many PACs    BP: sitting 144/67     SaO2: 95 RA  Pt feeling well. Able to stand independently, walk to BR then ambulate hall without assist. Steady. HR elevated but still appears ST with many PACs (p wave appears present before almost all beats). Pt with some SOB but overall tolerated well. Return to recliner, encouraged IS. [phone]   Harriet Masson CES, ACSM 03/29/2021 10:34 AM

## 2021-03-29 NOTE — Progress Notes (Signed)
Mobility Specialist: Progress Note   03/29/21 1357  Mobility  Activity Refused mobility   Attempted to see pt earlier today and she had just ambulated with cardiac rehab. Came back to see pt and refused d/t getting up and down a lot for BM, will f/u as able.  [facility] Cade Olberding Mobility Specialist Mobility Specialist Phone: [phone]

## 2021-03-29 NOTE — Progress Notes (Signed)
Progress Note  Patient Name: Kimberly Calhoun Date of Encounter: 03/29/2021  Primary Cardiologist:   Lance Muss, MD   Subjective   No pain.  Sitting and visiting with her daughter.  No pain  Inpatient Medications    Scheduled Meds:  acetaminophen  1,000 mg Oral Q6H   amiodarone  400 mg Oral Daily   apixaban  5 mg Oral BID   aspirin EC  81 mg Oral Daily   colchicine  0.3 mg Oral BID   ezetimibe  10 mg Oral Daily   feeding supplement  237 mL Oral BID BM   furosemide  40 mg Oral Daily   loratadine  10 mg Oral Daily   metoprolol tartrate  37.5 mg Oral BID   multivitamin with minerals  1 tablet Oral Daily   pantoprazole  40 mg Oral Daily   potassium chloride  20 mEq Oral Daily   sodium chloride flush  3 mL Intravenous Q12H   Continuous Infusions:  sodium chloride     lactated ringers     lactated ringers Stopped (03/25/21 1121)   PRN Meds: levalbuterol, loperamide, metoprolol tartrate, morphine injection, ondansetron (ZOFRAN) IV, oxyCODONE, sodium chloride flush, traMADol   Vital Signs    Vitals:   03/28/21 1937 03/29/21 0000 03/29/21 0025 03/29/21 0338  BP: 132/71  126/64 129/65  Pulse: 94  94 82  Resp: 20 (P) 20 20 20   Temp: 98.2 F (36.8 C)  98.2 F (36.8 C) 98.3 F (36.8 C)  TempSrc: Oral  Oral Oral  SpO2: 94%  98% 98%  Weight:    63.6 kg  Height:        Intake/Output Summary (Last 24 hours) at 03/29/2021 0910 Last data filed at 03/28/2021 0939 Gross per 24 hour  Intake 240 ml  Output --  Net 240 ml   Filed Weights   03/27/21 0526 03/28/21 0446 03/29/21 0338  Weight: 68 kg 64.9 kg 63.6 kg    Telemetry    PAF, NSR - Personally Reviewed  ECG    NA - Personally Reviewed  Physical Exam   GEN: No acute distress.   Neck: No  JVD Cardiac: RRR, no murmurs, rubs, or gallops.  Respiratory: Clear  to auscultation bilaterally. GI: Soft, nontender, non-distended  MS:     Mild right leg edema; No deformity. Neuro:  Nonfocal  Psych: Normal  affect  Labs    Chemistry Recent Labs  Lab 03/26/21 0500 03/27/21 0031 03/28/21 0233  NA 128* 129* 134*  K 3.9 3.6 4.0  CL 99 98 99  CO2 23 23 28   GLUCOSE 130* 118* 95  BUN 17 13 14   CREATININE 0.85 0.80 0.84  CALCIUM 8.2* 8.2* 8.7*  GFRNONAA >60 >60 >60  ANIONGAP 6 8 7      Hematology Recent Labs  Lab 03/26/21 0500 03/27/21 0031 03/28/21 0233  WBC 14.6* 15.0* 12.5*  RBC 3.30* 3.38* 3.42*  HGB 9.9* 10.1* 10.1*  HCT 29.5* 29.6* 30.3*  MCV 89.4 87.6 88.6  MCH 30.0 29.9 29.5  MCHC 33.6 34.1 33.3  RDW 15.9* 15.1 14.9  PLT 197 214 301    Cardiac EnzymesNo results for input(s): TROPONINI in the last 168 hours. No results for input(s): TROPIPOC in the last 168 hours.   BNPNo results for input(s): BNP, PROBNP in the last 168 hours.   DDimer No results for input(s): DDIMER in the last 168 hours.   Radiology    DG Chest 2 View  Result Date: 03/28/2021 CLINICAL  DATA:  Pneumothorax EXAM: CHEST - 2 VIEW COMPARISON:  03/27/2021 FINDINGS: No definite residual pneumothorax. Small bilateral pleural effusions with adjacent atelectasis. Stable cardiomediastinal contours. IMPRESSION: No definite residual pneumothorax. Small bilateral pleural effusions with adjacent atelectasis. Electronically Signed   By: Guadlupe Spanish M.D.   On: 03/28/2021 13:26    Cardiac Studies   LHC 03/22/21:  Diagnostic Dominance: Right      TTE 03/22/21: IMPRESSIONS   1. LVOT gradient 43 mmHg with Valsalva. Left ventricular ejection  fraction, by estimation, is 60 to 65%. The left ventricle has normal  function. The left ventricle demonstrates regional wall motion  abnormalities (see scoring diagram/findings for  description). There is moderate asymmetric left ventricular hypertrophy of  the basal-septal segment. Left ventricular diastolic parameters are  indeterminate. There is hypokinesis of the left ventricular, basal-mid  anteroseptal wall and inferoseptal wall.   2. Right ventricular systolic  function is normal. The right ventricular  size is normal.   3. The mitral valve is degenerative. Mild mitral valve regurgitation. No  evidence of mitral stenosis.   4. The aortic valve was not well visualized. Aortic valve regurgitation  is not visualized.   5. The inferior vena cava is normal in size with greater than 50%  respiratory variability, suggesting right atrial pressure of 3 mmHg.    Patient Profile     80 y.o. female with history of HLD, macular degeneration, and CKD stage 3a, who presents with progressive worsening dyspnea on exertion and chest pain found to have mildly elevated BNP and troponin prompting admission to cardiology for ischemic work-up    Assessment & Plan    CAD/CABG:  Progressing post op  ATRIAL FIB:    Still with PAF short runs with rapid rate.  Beta blocker dose was just increased yesterday.    POSSIBLE HOCM: No high risk features.  Volume management and rhythm management will be pursued as an out patient with as necessary med adjustments.  Currently appears to be euvolemic.  Small residual post op pleural effusions.   HYPONATREMIA:  Resolving.    DYSLIPIDEMIA:  Dr. Eldridge Dace can continue discussions around PCSK9 inhibition and Lipid Clinic visit.  She does not want a statin.      For questions or updates, please contact [facility] Please consult [email or web address] for contact info under Cardiology/STEMI.   Signed, Rollene Rotunda, MD  03/29/2021, 9:10 AM

## 2021-03-30 ENCOUNTER — Other Ambulatory Visit (HOSPITAL_COMMUNITY): Payer: Self-pay

## 2021-03-30 MED ORDER — AMIODARONE HCL 200 MG PO TABS
200.0000 mg | ORAL_TABLET | Freq: Two times a day (BID) | ORAL | 1 refills | Status: DC
  Filled 2021-03-30: qty 37, 30d supply, fill #0

## 2021-03-30 MED ORDER — TRAMADOL HCL 50 MG PO TABS
50.0000 mg | ORAL_TABLET | Freq: Four times a day (QID) | ORAL | 0 refills | Status: DC | PRN
  Filled 2021-03-30: qty 24, 6d supply, fill #0

## 2021-03-30 MED ORDER — FUROSEMIDE 40 MG PO TABS
40.0000 mg | ORAL_TABLET | Freq: Every day | ORAL | 0 refills | Status: DC
  Filled 2021-03-30: qty 4, 4d supply, fill #0

## 2021-03-30 MED ORDER — METOPROLOL TARTRATE 25 MG PO TABS
37.5000 mg | ORAL_TABLET | Freq: Two times a day (BID) | ORAL | 1 refills | Status: DC
  Filled 2021-03-30: qty 90, 30d supply, fill #0

## 2021-03-30 MED ORDER — EZETIMIBE 10 MG PO TABS
10.0000 mg | ORAL_TABLET | Freq: Every day | ORAL | 1 refills | Status: DC
  Filled 2021-03-30: qty 30, 30d supply, fill #0

## 2021-03-30 MED ORDER — APIXABAN 5 MG PO TABS
5.0000 mg | ORAL_TABLET | Freq: Two times a day (BID) | ORAL | 1 refills | Status: DC
  Filled 2021-03-30: qty 60, 30d supply, fill #0

## 2021-03-30 MED ORDER — POTASSIUM CHLORIDE CRYS ER 20 MEQ PO TBCR
20.0000 meq | EXTENDED_RELEASE_TABLET | Freq: Every day | ORAL | 0 refills | Status: DC
  Filled 2021-03-30: qty 4, 4d supply, fill #0

## 2021-03-30 MED FILL — Cefazolin Sodium-Dextrose IV Solution 2 GM/100ML-4%: INTRAVENOUS | Qty: 100 | Status: AC

## 2021-03-30 MED FILL — Lidocaine HCl Local Preservative Free (PF) Inj 2%: INTRAMUSCULAR | Qty: 5 | Status: AC

## 2021-03-30 MED FILL — Magnesium Sulfate Inj 50%: INTRAMUSCULAR | Qty: 10 | Status: AC

## 2021-03-30 MED FILL — Potassium Chloride Inj 2 mEq/ML: INTRAVENOUS | Qty: 40 | Status: AC

## 2021-03-30 MED FILL — Sodium Chloride IV Soln 0.9%: INTRAVENOUS | Qty: 3000 | Status: AC

## 2021-03-30 MED FILL — Heparin Sodium (Porcine) Inj 1000 Unit/ML: INTRAMUSCULAR | Qty: 30 | Status: AC

## 2021-03-30 MED FILL — Sodium Bicarbonate IV Soln 8.4%: INTRAVENOUS | Qty: 50 | Status: AC

## 2021-03-30 MED FILL — Electrolyte-R (PH 7.4) Solution: INTRAVENOUS | Qty: 4000 | Status: AC

## 2021-03-30 MED FILL — Heparin Sodium (Porcine) Inj 1000 Unit/ML: INTRAMUSCULAR | Qty: 10 | Status: AC

## 2021-03-30 MED FILL — Mannitol IV Soln 20%: INTRAVENOUS | Qty: 500 | Status: AC

## 2021-03-30 NOTE — Progress Notes (Addendum)
CARDIAC REHAB PHASE I   PRE:  Rate/Rhythm: 84 SR  Came to walk pt. Pt stated that she already walked 4x today and walks every hour. RN states that pt is staying in NSR when walking. Encouraged pt to walk every 2 hours. Discussed w/ pt exercise guidelines, sternal precautions, restrictions, and nutrition. Encouraged pt to continue IS use. Pt's goal is to work her way back to walking 2 mi per day. Told pt to go slow and progress from there. Discussed [facility]. Will refer pt to [facility] [place]. [id number]  Joya San, MS, ACM-CEP 03/30/2021 10:34 AM

## 2021-03-30 NOTE — Care Management Important Message (Signed)
Important Message  Patient Details  Name: Kimberly Calhoun MRN: [id number] Date of Birth: Mar 22, 1941   Medicare Important Message Given:  Yes     Renie Ora 03/30/2021, 8:29 AM

## 2021-03-30 NOTE — Progress Notes (Signed)
Progress Note  Patient Name: Kimberly Calhoun Date of Encounter: 03/30/2021  Primary Cardiologist:   Lance Muss, MD   Subjective   No pain or SOB.  Anxious to go home.   Inpatient Medications    Scheduled Meds:  amiodarone  400 mg Oral Daily   apixaban  5 mg Oral BID   aspirin EC  81 mg Oral Daily   colchicine  0.3 mg Oral BID   ezetimibe  10 mg Oral Daily   feeding supplement  237 mL Oral BID BM   furosemide  40 mg Oral Daily   loratadine  10 mg Oral Daily   metoprolol tartrate  37.5 mg Oral BID   multivitamin with minerals  1 tablet Oral Daily   pantoprazole  40 mg Oral Daily   potassium chloride  20 mEq Oral Daily   sodium chloride flush  3 mL Intravenous Q12H   Continuous Infusions:  sodium chloride     lactated ringers     lactated ringers Stopped (03/25/21 1121)   PRN Meds: levalbuterol, loperamide, metoprolol tartrate, morphine injection, ondansetron (ZOFRAN) IV, oxyCODONE, sodium chloride flush, traMADol   Vital Signs    Vitals:   03/29/21 2103 03/30/21 0043 03/30/21 0626 03/30/21 0811  BP: 136/68 138/69 123/63 129/63  Pulse: 97 94 (!) 108 99  Resp: 18 18 18 19   Temp: 98.6 F (37 C) 98.4 F (36.9 C) 98.1 F (36.7 C) 98.2 F (36.8 C)  TempSrc: Oral Oral Oral Oral  SpO2: 94% 97% 100% 96%  Weight:   63 kg   Height:        Intake/Output Summary (Last 24 hours) at 03/30/2021 1021 Last data filed at 03/30/2021 0810 Gross per 24 hour  Intake 320 ml  Output --  Net 320 ml   Filed Weights   03/28/21 0446 03/29/21 0338 03/30/21 0626  Weight: 64.9 kg 63.6 kg 63 kg    Telemetry    PAF, NSR - Personally Reviewed  ECG    NA - Personally Reviewed  Physical Exam   GEN: No  acute distress.   Neck: No  JVD Cardiac: RRR, no murmurs, rubs, or gallops.  Respiratory: Clear  to auscultation bilaterally. GI: Soft, nontender, non-distended, normal bowel sounds  MS:  Mild right leg edema; No deformity. Neuro:   Nonfocal  Psych: Oriented and  appropriate    Labs    Chemistry Recent Labs  Lab 03/26/21 0500 03/27/21 0031 03/28/21 0233  NA 128* 129* 134*  K 3.9 3.6 4.0  CL 99 98 99  CO2 23 23 28   GLUCOSE 130* 118* 95  BUN 17 13 14   CREATININE 0.85 0.80 0.84  CALCIUM 8.2* 8.2* 8.7*  GFRNONAA >60 >60 >60  ANIONGAP 6 8 7      Hematology Recent Labs  Lab 03/26/21 0500 03/27/21 0031 03/28/21 0233  WBC 14.6* 15.0* 12.5*  RBC 3.30* 3.38* 3.42*  HGB 9.9* 10.1* 10.1*  HCT 29.5* 29.6* 30.3*  MCV 89.4 87.6 88.6  MCH 30.0 29.9 29.5  MCHC 33.6 34.1 33.3  RDW 15.9* 15.1 14.9  PLT 197 214 301    Cardiac EnzymesNo results for input(s): TROPONINI in the last 168 hours. No results for input(s): TROPIPOC in the last 168 hours.   BNPNo results for input(s): BNP, PROBNP in the last 168 hours.   DDimer No results for input(s): DDIMER in the last 168 hours.   Radiology    No results found.  Cardiac Studies   LHC 03/22/21:  Diagnostic Dominance: Right      TTE 03/22/21: IMPRESSIONS   1. LVOT gradient 43 mmHg with Valsalva. Left ventricular ejection  fraction, by estimation, is 60 to 65%. The left ventricle has normal  function. The left ventricle demonstrates regional wall motion  abnormalities (see scoring diagram/findings for  description). There is moderate asymmetric left ventricular hypertrophy of  the basal-septal segment. Left ventricular diastolic parameters are  indeterminate. There is hypokinesis of the left ventricular, basal-mid  anteroseptal wall and inferoseptal wall.   2. Right ventricular systolic function is normal. The right ventricular  size is normal.   3. The mitral valve is degenerative. Mild mitral valve regurgitation. No  evidence of mitral stenosis.   4. The aortic valve was not well visualized. Aortic valve regurgitation  is not visualized.   5. The inferior vena cava is normal in size with greater than 50%  respiratory variability, suggesting right atrial pressure of 3 mmHg.     Patient Profile     80 y.o. female with history of HLD, macular degeneration, and CKD stage 3a, who presents with progressive worsening dyspnea on exertion and chest pain found to have mildly elevated BNP and troponin prompting admission to cardiology for ischemic work-up    Assessment & Plan    CAD/CABG:   Possible discharge today.    ATRIAL FIB:    Increased beta blocker yesterday.    Atrial Fib Clinic follow up is scheduled.   POSSIBLE HOCM: No high risk features.  See previous note.  We will follow clinically.     HYPONATREMIA:  Resolving.    DYSLIPIDEMIA:  Dr. Eldridge Dace can continue discussions around PCSK9 inhibition and Lipid Clinic visit.  Patient declines a statin.   CARDIOLOGY RECOMMENDATIONS:  Discharge is anticipated in the next 48 hours. Recommendations for medications and follow up:  Discharge Medications: Continue medications as they are [facility] the [facility]. Exceptions to the above: None  Follow Up: The patient's Primary Cardiologist is Lance Muss, MD  Follow up in the office in with APP and in Atrial Fib Clinic is arranged.   Signed,  Rollene Rotunda, MD  10:23 AM 03/30/2021  [facility]   For questions or updates, please contact [facility] Please consult [email or web address] for contact info under Cardiology/STEMI.   Signed, Rollene Rotunda, MD  03/30/2021, 10:21 AM

## 2021-03-30 NOTE — Progress Notes (Signed)
      [address]       [place] [address]             [phone]        6 Days Post-Op Procedure(s) (LRB): CORONARY ARTERY BYPASS GRAFTING (CABG) TIMES THREE USING LEFT INTERNAL MAMMARY ARTERY AND RIGHT GREATER SAPHENOUS VEIN HARVESTED ENDOSCOPICALLY (N/A) TRANSESOPHAGEAL ECHOCARDIOGRAM (TEE) (N/A)  Subjective: Patient has no specific complaint this am  Objective: Vital signs in last 24 hours: Temp:  [98 F (36.7 C)-98.6 F (37 C)] 98.1 F (36.7 C) (07/14 0626) Pulse Rate:  [94-108] 108 (07/14 0626) Cardiac Rhythm: Normal sinus rhythm (07/13 1900) Resp:  [18-19] 18 (07/14 0626) BP: (120-138)/(62-69) 123/63 (07/14 0626) SpO2:  [93 %-100 %] 100 % (07/14 0626) Weight:  [63 kg] 63 kg (07/14 0626)  Pre op weight 61.8 kg Current Weight  03/30/21 63 kg      Intake/Output from previous day: 07/13 0701 - 07/14 0700 In: 140 [P.O.:140] Out: -    Physical Exam:  Cardiovascular: RRR Pulmonary:Mostly clear Abdomen: Soft, non tender, bowel sounds present. Extremities: Mild bilateral lower extremity edema R>L but is decreasing. Ecchymosis right thigh Wounds: Sternal wound is clean and dry.  No erythema or signs of infection. [name] wound is clean and dry  Lab Results: CBC: Recent Labs    03/28/21 0233  WBC 12.5*  HGB 10.1*  HCT 30.3*  PLT 301    BMET:  Recent Labs    03/28/21 0233  NA 134*  K 4.0  CL 99  CO2 28  GLUCOSE 95  BUN 14  CREATININE 0.84  CALCIUM 8.7*     PT/INR:  Lab Results  Component Value Date   INR 1.3 (H) 03/24/2021   INR 1.0 03/24/2021   ABG:  INR: Will add last result for INR, ABG once components are confirmed Will add last 4 CBG results once components are confirmed  Assessment/Plan:  1. CV - Possible HCOM. PAF. SR this am. On Amiodarone 400 mg bid, Lopressor 37.5 mg bid, and Apixaban. 2.  Pulmonary - On room air. Encourage incentive spirometer 3. Volume Overload - On Lasix 40 mg daily. 4.  Expected post op  acute blood loss anemia - H and H this am stable at 10.1 and 30.3 5. History of CKD (stage III)-last creatinine stable at 0.8. 6. A fib rate appears better controlled. Will discuss with Dr. Vickey Sages if able to discharge later today vs am  Lelon Huh [facility] 03/30/2021,7:02 AM

## 2021-03-30 NOTE — Plan of Care (Signed)
  Problem: Health Behavior/Discharge Planning: Goal: Ability to manage health-related needs will improve Outcome: Progressing   Problem: Clinical Measurements: Goal: Will remain free from infection Outcome: Progressing Goal: Diagnostic test results will improve Outcome: Progressing   

## 2021-03-30 NOTE — Progress Notes (Signed)
Mobility Specialist: Progress Note   03/30/21 1202  Mobility  Activity Refused mobility   Pt refused mobility stating she has walked 4x today and is supposed to discharged today.   [facility] Keashia Haskins Mobility Specialist Mobility Specialist Phone: [phone]

## 2021-03-30 NOTE — Progress Notes (Signed)
Patient has been in sinus rhythm since being seen earlier this am. As discussed with Dr. Vickey Sages, will discharge home today

## 2021-04-06 ENCOUNTER — Telehealth (HOSPITAL_COMMUNITY): Payer: Self-pay

## 2021-04-06 ENCOUNTER — Other Ambulatory Visit (HOSPITAL_COMMUNITY): Payer: Self-pay

## 2021-04-06 NOTE — Telephone Encounter (Signed)
Transitions of Care Pharmacy   Call attempted for a pharmacy transitions of care follow-up. Unable to leave voicemail.  Call attempt #1. Will follow-up in 2-3 days.    

## 2021-04-07 ENCOUNTER — Other Ambulatory Visit (HOSPITAL_COMMUNITY): Payer: Self-pay

## 2021-04-07 ENCOUNTER — Telehealth (HOSPITAL_COMMUNITY): Payer: Self-pay

## 2021-04-07 NOTE — Telephone Encounter (Signed)
Pharmacy Transitions of Care Follow-up Telephone Call  Date of discharge: 03/30/21  Discharge Diagnosis: NSTEMI  How have you been since you were released from the hospital? Patient has been doing well and walking around the house. Feels she is getting stronger since discharge.   Medication changes made at discharge:      START taking: amiodarone (PACERONE)  Eliquis (apixaban)  ezetimibe (ZETIA)  furosemide (LASIX)  metoprolol tartrate (LOPRESSOR)  potassium chloride SA (KLOR-CON)  traMADol (ULTRAM)   CHANGE how you take: albuterol (VENTOLIN HFA)  vitamin E   STOP taking: ibuprofen 200 MG tablet (ADVIL)   Medication changes verified by the patient? Yes    Medication Accessibility:  Home Pharmacy:  [facility] [address] [place]  Was the patient provided with refills on discharged medications? Yes   Have all prescriptions been transferred from [facility] to home pharmacy?  Yes  Is the patient able to afford medications? Patient has AARPMPD    Medication Review:  APIXABAN (ELIQUIS)  Apixaban 5 mg BID initiated on 03/30/21. - Discussed importance of taking medication around the same time everyday  - Advised patient of medications to avoid (NSAIDs, ASA)  - Educated that Tylenol (acetaminophen) will be the preferred analgesic to prevent risk of bleeding  - Emphasized importance of monitoring for signs and symptoms of bleeding (abnormal bruising, prolonged bleeding, nose bleeds, bleeding from gums, discolored urine, black tarry stools)  - Advised patient to alert all providers of anticoagulation therapy prior to starting a new medication or having a procedure   Follow-up Appointments:  PCP Hospital f/u appt confirmed? Will make an appointment for next week. Wants to be seen for possible thrush but patient had irritation in throat while she was in hospital.   Specialist Hospital f/u appt confirmed? Yes Scheduled to see Dr. Charlean Merl on 04/11/21 @ Cardiology.   If their condition  worsens, is the pt aware to call PCP or go to the Emergency Dept.? Yes  Final Patient Assessment: Patient has refills at home pharmacy and follow up scheduled.

## 2021-04-11 ENCOUNTER — Other Ambulatory Visit: Payer: Self-pay | Admitting: Cardiothoracic Surgery

## 2021-04-11 ENCOUNTER — Ambulatory Visit (HOSPITAL_COMMUNITY)
Admit: 2021-04-11 | Discharge: 2021-04-11 | Disposition: A | Discharge status: Home / Self Care | Payer: Medicare Other | Source: Ambulatory Visit | Attending: Physician Assistant | Admitting: Physician Assistant

## 2021-04-11 ENCOUNTER — Encounter (HOSPITAL_COMMUNITY): Payer: Self-pay | Admitting: Physician Assistant

## 2021-04-11 ENCOUNTER — Other Ambulatory Visit: Payer: Self-pay

## 2021-04-11 VITALS — BP 150/86 | HR 89 | Ht 63.0 in | Wt 133.8 lb

## 2021-04-11 DIAGNOSIS — D6869 Other thrombophilia: Secondary | ICD-10-CM | POA: Insufficient documentation

## 2021-04-11 DIAGNOSIS — I129 Hypertensive chronic kidney disease with stage 1 through stage 4 chronic kidney disease, or unspecified chronic kidney disease: Secondary | ICD-10-CM | POA: Insufficient documentation

## 2021-04-11 DIAGNOSIS — Z951 Presence of aortocoronary bypass graft: Secondary | ICD-10-CM

## 2021-04-11 DIAGNOSIS — N183 Chronic kidney disease, stage 3 unspecified: Secondary | ICD-10-CM | POA: Insufficient documentation

## 2021-04-11 DIAGNOSIS — I2510 Atherosclerotic heart disease of native coronary artery without angina pectoris: Secondary | ICD-10-CM | POA: Insufficient documentation

## 2021-04-11 DIAGNOSIS — Z7901 Long term (current) use of anticoagulants: Secondary | ICD-10-CM | POA: Diagnosis not present

## 2021-04-11 DIAGNOSIS — Z79899 Other long term (current) drug therapy: Secondary | ICD-10-CM | POA: Insufficient documentation

## 2021-04-11 DIAGNOSIS — Z8249 Family history of ischemic heart disease and other diseases of the circulatory system: Secondary | ICD-10-CM | POA: Diagnosis not present

## 2021-04-11 DIAGNOSIS — I480 Paroxysmal atrial fibrillation: Secondary | ICD-10-CM | POA: Insufficient documentation

## 2021-04-11 NOTE — Progress Notes (Signed)
Primary Care Physician: Ileana Ladd, MD Primary Cardiologist: Dr Eldridge Dace  Primary Electrophysiologist: none Referring Physician: Dr Blima Dessert is a 80 y.o. female with a history of CAD, HCOM, HLD, CKD, atrial fibrillation who presents for consultation in the [facility]rial Fibrillation [facility].  The patient was initially diagnosed with atrial fibrillation postoperatively at the time of her CABG. She was started on Eliquis for a CHADS2VASC score of 5 and amiodarone for rhythm control. She denies any heart racing or palpitations since leaving the hospital. She is doing very well postop with no chest pain. She denies any bleeding issues on anticoagulation.   Today, she denies symptoms of palpitations, chest pain, shortness of breath, orthopnea, PND, lower extremity edema, dizziness, presyncope, syncope, snoring, daytime somnolence, bleeding, or neurologic sequela. The patient is tolerating medications without difficulties and is otherwise without complaint today.    Atrial Fibrillation Risk Factors:  she does not have symptoms or diagnosis of sleep apnea. she does not have a history of rheumatic fever. she does not have a history of alcohol use.   she has a BMI of Body mass index is 23.7 kg/m.Marland Kitchen Filed Weights   04/11/21 1355  Weight: 60.7 kg    Family History  Problem Relation Age of Onset   Heart disease Father    Diabetes Brother    Breast cancer Neg Hx      Atrial Fibrillation Management history:  Previous antiarrhythmic drugs: amiodarone  Previous cardioversions: none Previous ablations: none CHADS2VASC score: 5 Anticoagulation history: Eliquis   Past Medical History:  Diagnosis Date   Arthritis    Chronic kidney disease, stage 3 (HCC)    Headache    Hypercholesteremia    Hyperlipidemia    Osteopenia    Personal history of colonic polyps    Past Surgical History:  Procedure Laterality Date   ABDOMINAL HYSTERECTOMY  02/2013    COLONOSCOPY WITH PROPOFOL N/A 12/07/2014   Procedure: COLONOSCOPY WITH PROPOFOL;  Surgeon: Bernette Redbird, MD;  Location: [facility] ENDOSCOPY;  Service: Endoscopy;  Laterality: N/A;  ultraslim   CORONARY ARTERY BYPASS GRAFT N/A 03/24/2021   Procedure: CORONARY ARTERY BYPASS GRAFTING (CABG) TIMES THREE USING LEFT INTERNAL MAMMARY ARTERY AND RIGHT GREATER SAPHENOUS VEIN HARVESTED ENDOSCOPICALLY;  Surgeon: Linden Dolin, MD;  Location: [facility] OR;  Service: Open Heart Surgery;  Laterality: N/A;   LEFT HEART CATH AND CORONARY ANGIOGRAPHY N/A 03/22/2021   Procedure: LEFT HEART CATH AND CORONARY ANGIOGRAPHY;  Surgeon: Lennette Bihari, MD;  Location: [facility] INVASIVE CV LAB;  Service: Cardiovascular;  Laterality: N/A;   TEE WITHOUT CARDIOVERSION N/A 03/24/2021   Procedure: TRANSESOPHAGEAL ECHOCARDIOGRAM (TEE);  Surgeon: Linden Dolin, MD;  Location[facility] OR;  Service: Open Heart Surgery;  Laterality: N/A;    Current Outpatient Medications  Medication Sig Dispense Refill   acetaminophen (TYLENOL) 500 MG tablet Take 500 mg by mouth every 6 (six) hours as needed for moderate pain or headache.     albuterol (VENTOLIN HFA) 108 (90 Base) MCG/ACT inhaler 1-2 puffs as needed     amiodarone (PACERONE) 200 MG tablet Take 1 tablet (200 mg total) by mouth 2 (two) times daily for one week then take Amiodarone 200 mg daily thereafter 60 tablet 1   apixaban (ELIQUIS) 5 MG TABS tablet Take 1 tablet (5 mg total) by mouth 2 (two) times daily. 60 tablet 1   CALCIUM PO Take 1 tablet by mouth at bedtime as needed.     cetirizine (  ZYRTEC) 10 MG tablet Take 10 mg by mouth daily.     Cholecalciferol (VITAMIN D PO) Take 1 tablet by mouth every morning.     Cyanocobalamin 5000 MCG SUBL Place under the tongue.     ezetimibe (ZETIA) 10 MG tablet Take 1 tablet (10 mg total) by mouth daily. 30 tablet 1   ipratropium (ATROVENT) 0.03 % nasal spray Place 2 sprays into both nostrils 2 (two) times daily.     metoprolol tartrate (LOPRESSOR) 25 MG tablet  Take 1.5 tablets (37.5 mg total) by mouth 2 (two) times daily. 90 tablet 1   Multiple Vitamin (MULTIVITAMIN WITH MINERALS) TABS tablet Take 1 tablet by mouth at bedtime. Centrum silver.     Propylene Glycol (SYSTANE BALANCE OP) Apply 1 drop to eye daily as needed (dry eyes.).     vitamin C (ASCORBIC ACID) 500 MG tablet Take 500 mg by mouth every morning.     [START ON 04/24/2021] vitamin E 180 MG (400 UNITS) capsule Take 1 capsule (400 Units total) by mouth every morning.     zinc gluconate 50 MG tablet Take 50 mg by mouth daily.     No current facility-administered medications for this encounter.    Allergies  Allergen Reactions   Macrobid [Nitrofurantoin] Other (See Comments)    Cold chills, muscle weakness.   Mold Extract [Trichophyton] Other (See Comments)    Allergies.    Statins Other (See Comments)   Other Other (See Comments)    Allergies.   Mold in Cheese - stuffy nose and headache  Chocolate- stuffy nose and headache  Mildew - stuffy nose and headache    Social History   Socioeconomic History   Marital status: Married    Spouse name: Not on file   Number of children: Not on file   Years of education: Not on file   Highest education level: Not on file  Occupational History   Not on file  Tobacco Use   Smoking status: Never   Smokeless tobacco: Never  Substance and Sexual Activity   Alcohol use: No   Drug use: No   Sexual activity: Not on file  Other Topics Concern   Not on file  Social History Narrative   Not on file   Social Determinants of Health   Financial Resource Strain: Not on file  Food Insecurity: Not on file  Transportation Needs: Not on file  Physical Activity: Not on file  Stress: Not on file  Social Connections: Not on file  Intimate Partner Violence: Not on file     ROS- All systems are reviewed and negative except as per the HPI above.  Physical Exam: Vitals:   04/11/21 1355  BP: (!) 150/86  Pulse: 89  Weight: 60.7 kg  Height:  5\' 3"  (1.6 m)    GEN- The patient is a well appearing elderly female, alert and oriented x 3 today.   Head- normocephalic, atraumatic Eyes-  Sclera clear, conjunctiva pink Ears- hearing intact Oropharynx- clear Neck- supple  Lungs- Clear to ausculation bilaterally, normal work of breathing Heart- Regular rate and rhythm, no murmurs, rubs or gallops  GI- soft, NT, ND, + BS Extremities- no clubbing, cyanosis, or edema MS- no significant deformity or atrophy Skin- no rash or lesion Psych- euthymic mood, full affect Neuro- strength and sensation are intact  Wt Readings from Last 3 Encounters:  04/11/21 60.7 kg  03/30/21 63 kg  01/17/21 63.9 kg    EKG today demonstrates  SR Vent. rate  89 BPM PR interval 140 ms QRS duration 90 ms QT/QTcB 364/442 ms  Echo 03/22/21 demonstrated  1. LVOT gradient 43 mmHg with Valsalva. Left ventricular ejection  fraction, by estimation, is 60 to 65%. The left ventricle has normal  function. The left ventricle demonstrates regional wall motion  abnormalities (see scoring diagram/findings for  description). There is moderate asymmetric left ventricular hypertrophy of  the basal-septal segment. Left ventricular diastolic parameters are  indeterminate. There is hypokinesis of the left ventricular, basal-mid  anteroseptal wall and inferoseptal wall.   2. Right ventricular systolic function is normal. The right ventricular  size is normal.   3. The mitral valve is degenerative. Mild mitral valve regurgitation. No  evidence of mitral stenosis.   4. The aortic valve was not well visualized. Aortic valve regurgitation  is not visualized.   5. The inferior vena cava is normal in size with greater than 50%  respiratory variability, suggesting right atrial pressure of 3 mmHg.   Comparison(s): No prior Echocardiogram.   Conclusion(s)/Recommendation(s): Focal hypokinesis at basal septum with  preserved EF.   Epic records are reviewed at length  today  CHA2DS2-VASc Score = 5  The patient's score is based upon: CHF History: No HTN History: Yes Diabetes History: No Stroke History: No Vascular Disease History: Yes Age Score: 2 Gender Score: 1     ASSESSMENT AND PLAN: 1. Paroxysmal Atrial Fibrillation (ICD10:  I48.0) The patient's CHA2DS2-VASc score is 5, indicating a 7.2% annual risk of stroke.   Diagnosed postoperatively.  Continue amiodarone 200 mg daily and Eliquis 5 mg BID for now. Anticipate these will be short term if no recurrence of afib. If she does have recurrence, life-long anticoagulation would be indicated with her elevated CV score and h/o HCOM.  2. Secondary Hypercoagulable State (ICD10:  D68.69) The patient is at significant risk for stroke/thromboembolism based upon her CHA2DS2-VASc Score of 5.  Continue Apixaban (Eliquis).   3. CAD S/p CABG No anginal symptoms.  4. HCOM EF preserved, gradient 43 mmHg  5. HTN Stable, no changes today.   Follow up with [facility] as scheduled.    Jorja Loa PA-C Afib Clinic [facility] [address] [place], [place] [address] [phone] 04/11/2021 2:33 PM

## 2021-04-13 ENCOUNTER — Ambulatory Visit
Admission: RE | Admit: 2021-04-13 | Discharge: 2021-04-13 | Disposition: A | Discharge status: Home / Self Care | Payer: Medicare Other | Source: Ambulatory Visit | Attending: Cardiothoracic Surgery | Admitting: Cardiothoracic Surgery

## 2021-04-13 ENCOUNTER — Ambulatory Visit (INDEPENDENT_AMBULATORY_CARE_PROVIDER_SITE_OTHER): Payer: Self-pay | Admitting: Cardiothoracic Surgery

## 2021-04-13 ENCOUNTER — Other Ambulatory Visit: Payer: Self-pay | Admitting: Cardiothoracic Surgery

## 2021-04-13 ENCOUNTER — Other Ambulatory Visit: Payer: Self-pay

## 2021-04-13 VITALS — BP 190/80 | HR 99 | Resp 20 | Ht 63.0 in | Wt 134.0 lb

## 2021-04-13 DIAGNOSIS — Z951 Presence of aortocoronary bypass graft: Secondary | ICD-10-CM

## 2021-04-14 ENCOUNTER — Other Ambulatory Visit: Payer: Self-pay | Admitting: *Deleted

## 2021-04-14 DIAGNOSIS — J90 Pleural effusion, not elsewhere classified: Secondary | ICD-10-CM

## 2021-04-14 DIAGNOSIS — Z951 Presence of aortocoronary bypass graft: Secondary | ICD-10-CM

## 2021-04-17 ENCOUNTER — Ambulatory Visit (HOSPITAL_COMMUNITY)
Admission: RE | Admit: 2021-04-17 | Discharge: 2021-04-17 | Disposition: A | Discharge status: Home / Self Care | Payer: Medicare Other | Source: Ambulatory Visit | Attending: Thoracic Surgery (Cardiothoracic Vascular Surgery) | Admitting: Thoracic Surgery (Cardiothoracic Vascular Surgery)

## 2021-04-17 ENCOUNTER — Telehealth (HOSPITAL_COMMUNITY): Payer: Self-pay

## 2021-04-17 ENCOUNTER — Other Ambulatory Visit (HOSPITAL_COMMUNITY): Payer: Medicare Other

## 2021-04-17 ENCOUNTER — Other Ambulatory Visit: Payer: Self-pay

## 2021-04-17 ENCOUNTER — Ambulatory Visit (HOSPITAL_COMMUNITY)
Admission: RE | Admit: 2021-04-17 | Discharge: 2021-04-17 | Disposition: A | Discharge status: Home / Self Care | Payer: Medicare Other | Source: Ambulatory Visit | Attending: Student | Admitting: Student

## 2021-04-17 DIAGNOSIS — J90 Pleural effusion, not elsewhere classified: Secondary | ICD-10-CM | POA: Diagnosis present

## 2021-04-17 DIAGNOSIS — Z9889 Other specified postprocedural states: Secondary | ICD-10-CM

## 2021-04-17 DIAGNOSIS — Z951 Presence of aortocoronary bypass graft: Secondary | ICD-10-CM

## 2021-04-17 HISTORY — PX: IR THORACENTESIS ASP PLEURAL SPACE W/IMG GUIDE: IMG5380

## 2021-04-17 MED ORDER — LIDOCAINE HCL (PF) 1 % IJ SOLN
INTRAMUSCULAR | Status: DC | PRN
  Administered 2021-04-17: 10 mL

## 2021-04-17 MED ORDER — LIDOCAINE HCL 1 % IJ SOLN
INTRAMUSCULAR | Status: AC
  Filled 2021-04-17: qty 20

## 2021-04-17 NOTE — Telephone Encounter (Signed)
Pt insurance is active and benefits verified through [facility] Medicare Co-pay 0, DED 0/0 met, out of pocket $3,600/$1,046.16 met, co-insurance 0%. no pre-authorization required. Passport, 04/17/2021_0 :44pm, REF# [id number]   Will contact patient to see if she is interested in the Cardiac Rehab Program. If interested, patient will need to complete follow up appt. Once completed, patient will be contacted for scheduling upon review by the RN Navigator.

## 2021-04-17 NOTE — Procedures (Signed)
PROCEDURE SUMMARY:  Successful US guided left thoracentesis. Yielded 800 ml of light red fluid. Pt tolerated procedure well. No immediate complications.  CXR ordered; no post-procedure pneumothorax identified  EBL < 2 mL  Mickie Kay, NP 04/17/2021 11:06 AM

## 2021-04-18 ENCOUNTER — Ambulatory Visit (INDEPENDENT_AMBULATORY_CARE_PROVIDER_SITE_OTHER): Payer: Self-pay | Admitting: Physician Assistant

## 2021-04-18 VITALS — BP 128/75 | HR 88 | Resp 20 | Ht 63.0 in | Wt 132.0 lb

## 2021-04-18 DIAGNOSIS — J90 Pleural effusion, not elsewhere classified: Secondary | ICD-10-CM

## 2021-04-18 DIAGNOSIS — Z951 Presence of aortocoronary bypass graft: Secondary | ICD-10-CM

## 2021-04-18 NOTE — Patient Instructions (Signed)
You may return to driving an automobile as long as you are no longer requiring oral narcotic pain relievers during the daytime.  It would be wise to start driving only short distances during the daylight and gradually increase from there as you feel comfortable.   You are encouraged to enroll and participate in the outpatient cardiac rehab program beginning as soon as practical.   Continue to avoid any heavy lifting or strenuous use of your arms or shoulders for at least a total of three months from the time of surgery.  After three months you may gradually increase how much you lift or otherwise use your arms or chest as tolerated, with limits based upon whether or not activities lead to the return of significant discomfort.

## 2021-04-18 NOTE — Progress Notes (Signed)
HPI: This is an 80 year old female with a past medical history of chronic kidney disease, stage 3 (HCC) and hyperlipidemia was found to have a NSTEMI and underwent A CABG x 3 by Dr. Vickey Sages on 03/24/2021. The patient's early postoperative recovery while in the hospital was notable for atrial fibrillation and she was put on Amiodarone. She was already on a beta  blocker post op and this was titrated accordingly. Because of PAF, she was started on Apixaban. She had follow up arranged in the a fib clinic as well as  with cardiology. Patient was found to have a left pleural effusion and underwent  a left thoracentesis on 04/17/2021. Approximately 800 ml was removed. She presents today for follow up after left thoracentesis. Patient states breathing is better since having procedure yesterday. She denies chest pain. She has had intermittent nausea, no abdominal pain.   Current Outpatient Medications  Medication Sig Dispense Refill   acetaminophen (TYLENOL) 500 MG tablet Take 500 mg by mouth every 6 (six) hours as needed for moderate pain or headache.     albuterol (VENTOLIN HFA) 108 (90 Base) MCG/ACT inhaler 1-2 puffs as needed     amiodarone (PACERONE) 200 MG tablet Take 1 tablet (200 mg total) by mouth 2 (two) times daily for one week then take Amiodarone 200 mg daily thereafter 60 tablet 1   apixaban (ELIQUIS) 5 MG TABS tablet Take 1 tablet (5 mg total) by mouth 2 (two) times daily. 60 tablet 1   CALCIUM PO Take 1 tablet by mouth at bedtime as needed.     cetirizine (ZYRTEC) 10 MG tablet Take 10 mg by mouth daily.     Cholecalciferol (VITAMIN D PO) Take 1 tablet by mouth every morning.     Cyanocobalamin 5000 MCG SUBL Place under the tongue.     ezetimibe (ZETIA) 10 MG tablet Take 1 tablet (10 mg total) by mouth daily. 30 tablet 1   ipratropium (ATROVENT) 0.03 % nasal spray Place 2 sprays into both nostrils 2 (two) times daily.     metoprolol tartrate (LOPRESSOR) 25 MG tablet Take 1.5 tablets  (37.5 mg total) by mouth 2 (two) times daily. 90 tablet 1   Multiple Vitamin (MULTIVITAMIN WITH MINERALS) TABS tablet Take 1 tablet by mouth at bedtime. Centrum silver.     Propylene Glycol (SYSTANE BALANCE OP) Apply 1 drop to eye daily as needed (dry eyes.).     vitamin C (ASCORBIC ACID) 500 MG tablet Take 500 mg by mouth every morning.     [START ON 04/24/2021] vitamin E 180 MG (400 UNITS) capsule Take 1 capsule (400 Units total) by mouth every morning.     zinc gluconate 50 MG tablet Take 50 mg by mouth daily.     Vitals:   04/18/21 1306  BP: 128/75  Pulse: 88  Resp: 20  SpO2: 96%    Physical Exam: CV-RRR Pulmonary-Clear to ausculation bilaterally. Band aid on left posterior chest (from thoracentesis) Abdomen-Soft, non tender, bowel sounds present Extremities-no LE edema Wounds-Both sternal and [name] wounds are clean and dry. No sign of infection.  Diagnostic Tests: CLINICAL DATA:  Post thoracentesis   EXAM: CHEST  1 VIEW   COMPARISON:  None.   FINDINGS: Prior CABG. Mild hyperinflation. Left basilar scarring. Right lung clear. No effusions or acute bony abnormality.   IMPRESSION: No active disease.     Electronically Signed   By: Charlett Nose M.D.   On: 04/17/2021 10:50  Impression and Plan: Patient is  continuing to recover from coronary artery bypass grafting surgery. Regarding patient's nausea, this may be due to Amiodarone. She has an appointment to see cardiology on 08/03 so I will defer to them if able to decrease or stop (on for post op atrial fibrillation). If Amiodarone is stopped and nausea persists, she will see her medical doctor. Of note, she has no abdominal pain or vomiting. Patient inquired if able to decrease Apixaban dose. Based on guidelines, (she is age >2 and weight less than 60 kg) this may be possible but again, I will defer to cardiology. Patient was instructed to continue with sternal precautions (I.e. no lifting more than 10 pounds) for the next 3-4  weeks.  We discussed that since she is NOT taking any narcotic for pain, she may begin driving short distances (I.e. 30 minutes or less during the day) and may gradually increase the frequency and duration as tolerates. I encourage the patient to participate in cardiac rehab. She has family that assists her and has ability to do PT so she will likely not participate. Patient will return to see a TCTS surgeon with PA/LAT CXR in about 2 weeks. She was instructed if she gets worsening shortness of breath (as she experienced last time she had left pleural effusion) to call to be seen sooner,   Ardelle Balls, PA-C [facility] [phone]

## 2021-04-19 ENCOUNTER — Ambulatory Visit: Payer: Medicare Other | Admitting: Physician Assistant

## 2021-04-19 ENCOUNTER — Other Ambulatory Visit: Payer: Self-pay

## 2021-04-19 ENCOUNTER — Encounter: Payer: Self-pay | Admitting: Physician Assistant

## 2021-04-19 VITALS — BP 126/62 | HR 80 | Ht 63.0 in | Wt 132.8 lb

## 2021-04-19 DIAGNOSIS — E782 Mixed hyperlipidemia: Secondary | ICD-10-CM | POA: Diagnosis not present

## 2021-04-19 DIAGNOSIS — I25700 Atherosclerosis of coronary artery bypass graft(s), unspecified, with unstable angina pectoris: Secondary | ICD-10-CM

## 2021-04-19 DIAGNOSIS — I480 Paroxysmal atrial fibrillation: Secondary | ICD-10-CM | POA: Diagnosis not present

## 2021-04-19 DIAGNOSIS — I214 Non-ST elevation (NSTEMI) myocardial infarction: Secondary | ICD-10-CM

## 2021-04-19 DIAGNOSIS — I421 Obstructive hypertrophic cardiomyopathy: Secondary | ICD-10-CM

## 2021-04-19 MED ORDER — AMIODARONE HCL 200 MG PO TABS: 100.0000 mg | ORAL_TABLET | Freq: Every day | ORAL | 6 refills | Status: DC

## 2021-04-19 NOTE — Patient Instructions (Addendum)
Medication Instructions:  Your physician has recommended you make the following change in your medication: Decrease amiodarone to 100 mg by mouth daily.  This will be half of a 200 mg tablet  *If you need a refill on your cardiac medications before your next appointment, please call your pharmacy*   Lab Work: none If you have labs (blood work) drawn today and your tests are completely normal, you will receive your results only by: MyChart Message (if you have MyChart) OR A paper copy in the mail If you have any lab test that is abnormal or we need to change your treatment, we will call you to review the results.   Testing/Procedures: none   Follow-Up: At [facility], you and your health needs are our priority.  As part of our continuing mission to provide you with exceptional heart care, we have created designated Provider Care Teams.  These Care Teams include your primary Cardiologist (physician) and Advanced Practice Providers (APPs -  Physician Assistants and Nurse Practitioners) who all work together to provide you with the care you need, when you need it.  We recommend signing up for the patient portal called "MyChart".  Sign up information is provided on this After Visit Summary.  MyChart is used to connect with patients for Virtual Visits (Telemedicine).  Patients are able to view lab/test results, encounter notes, upcoming appointments, etc.  Non-urgent messages can be sent to your provider as well.   To learn more about what you can do with MyChart, go to [email or web address].    Your next appointment:   September 14,2022 at 11:40  The format for your next appointment:   In Person  Provider:   Everette Rank, MD   Other Instructions

## 2021-04-19 NOTE — Progress Notes (Signed)
Cardiology Office Note:    Date:  04/19/2021   ID:  Kimberly Calhoun, DOB 04/22/1941, MRN [id number]  PCP:  Ileana Ladd, MD  [facility] Cardiologist:  Lance Muss, MD  [facility] Electrophysiologist:  None   Chief Complaint: Hospital follow up   History of Present Illness:    Kimberly Calhoun is a 80 y.o. female with a hx of HLD, macular degeneration, CKD stage 3a and recent admission for NSTEMI seen for follow up.   Admitted 03/2021 with NSTEMI. 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  She underwent a NST which showed a large area of reversible ischemia of the septum, distal segments of all walls, and LV apex.  Cath showed multivessel CAD.  Status post CABG x3.  Course complicated by postop atrial fibrillation.  Started on amiodarone and Eliquis given CHA2DS2-VASc score of 5.  Placed on metoprolol.  Patient was seen in A. fib clinic 04/11/21.  Felt amiodarone and Eliquis likely short-term.  Patient was found to have left pleural effusion and underwent left thoracentesis on 04/17/21 with 800 ml removal.  She is here for follow up with daughter.  Breathing has been improved and now stable after thoracentesis.  Reports no nausea today.  She denies chest pain, shortness of breath, orthopnea, PND, lower extremity edema or melena.  Walking as tolerated.  No palpitation, dizziness or syncope.   Past Medical History:  Diagnosis Date   Arthritis    Chronic kidney disease, stage 3 (HCC)    Headache    Hypercholesteremia    Hyperlipidemia    Osteopenia    Personal history of colonic polyps     Past Surgical History:  Procedure Laterality Date   ABDOMINAL HYSTERECTOMY  02/2013   COLONOSCOPY WITH PROPOFOL N/A 12/07/2014   Procedure: COLONOSCOPY WITH PROPOFOL;  Surgeon: Bernette Redbird, MD;  Location: [facility] ENDOSCOPY;  Service: Endoscopy;  Laterality:  N/A;  ultraslim   CORONARY ARTERY BYPASS GRAFT N/A 03/24/2021   Procedure: CORONARY ARTERY BYPASS GRAFTING (CABG) TIMES THREE USING LEFT INTERNAL MAMMARY ARTERY AND RIGHT GREATER SAPHENOUS VEIN HARVESTED ENDOSCOPICALLY;  Surgeon: Linden Dolin, MD;  Location: [facility] OR;  Service: Open Heart Surgery;  Laterality: N/A;   IR THORACENTESIS ASP PLEURAL SPACE W/IMG GUIDE  04/17/2021   LEFT HEART CATH AND CORONARY ANGIOGRAPHY N/A 03/22/2021   Procedure: LEFT HEART CATH AND CORONARY ANGIOGRAPHY;  Surgeon: Lennette Bihari, MD;  Location: [facility] INVASIVE CV LAB;  Service: Cardiovascular;  Laterality: N/A;   TEE WITHOUT CARDIOVERSION N/A 03/24/2021   Procedure: TRANSESOPHAGEAL ECHOCARDIOGRAM (TEE);  Surgeon: Linden Dolin, MD;  Location: [facility] OR;  Service: Open Heart Surgery;  Laterality: N/A;    Current Medications: Current Meds  Medication Sig   acetaminophen (TYLENOL) 500 MG tablet Take 500 mg by mouth every 6 (six) hours as needed for moderate pain or headache.   albuterol (VENTOLIN HFA) 108 (90 Base) MCG/ACT inhaler 1-2 puffs as needed   amiodarone (PACERONE) 200 MG tablet Take 0.5 tablets (100 mg total) by mouth daily.   apixaban (ELIQUIS) 5 MG TABS tablet Take 1 tablet (5 mg total) by mouth 2 (two) times daily.   CALCIUM PO Take 1 tablet by mouth at bedtime as needed.   cetirizine (ZYRTEC) 10 MG tablet Take 10 mg by mouth daily.   Cholecalciferol (VITAMIN D PO) Take 1 tablet by mouth every morning.  Cyanocobalamin 5000 MCG SUBL Place under the tongue.   ezetimibe (ZETIA) 10 MG tablet Take 1 tablet (10 mg total) by mouth daily.   ipratropium (ATROVENT) 0.03 % nasal spray Place 2 sprays into both nostrils 2 (two) times daily.   metoprolol tartrate (LOPRESSOR) 25 MG tablet Take 1.5 tablets (37.5 mg total) by mouth 2 (two) times daily.   Multiple Vitamin (MULTIVITAMIN WITH MINERALS) TABS tablet Take 1 tablet by mouth at bedtime. Centrum silver.   Propylene Glycol (SYSTANE BALANCE OP) Apply 1 drop to eye  daily as needed (dry eyes.).   vitamin C (ASCORBIC ACID) 500 MG tablet Take 500 mg by mouth every morning.   [START ON 04/24/2021] vitamin E 180 MG (400 UNITS) capsule Take 1 capsule (400 Units total) by mouth every morning.   zinc gluconate 50 MG tablet Take 50 mg by mouth daily.   [DISCONTINUED] amiodarone (PACERONE) 200 MG tablet Take 1 tablet (200 mg total) by mouth 2 (two) times daily for one week then take Amiodarone 200 mg daily thereafter     Allergies:   Macrobid [nitrofurantoin], Mold extract [trichophyton], Statins, and Other   Social History   Socioeconomic History   Marital status: Married    Spouse name: Not on file   Number of children: Not on file   Years of education: Not on file   Highest education level: Not on file  Occupational History   Not on file  Tobacco Use   Smoking status: Never   Smokeless tobacco: Never  Substance and Sexual Activity   Alcohol use: No   Drug use: No   Sexual activity: Not on file  Other Topics Concern   Not on file  Social History Narrative   Not on file   Social Determinants of Health   Financial Resource Strain: Not on file  Food Insecurity: Not on file  Transportation Needs: Not on file  Physical Activity: Not on file  Stress: Not on file  Social Connections: Not on file     Family History: The patient's family history includes Diabetes in her brother; Heart disease in her father. There is no history of Breast cancer.    ROS:   Please see the history of present illness.    All other systems reviewed and are negative.   EKGs/Labs/Other Studies Reviewed:    The following studies were reviewed today: Echo 03/22/21 1. LVOT gradient 43 mmHg with Valsalva. Left ventricular ejection  fraction, by estimation, is 60 to 65%. The left ventricle has normal  function. The left ventricle demonstrates regional wall motion  abnormalities (see scoring diagram/findings for  description). There is moderate asymmetric left ventricular  hypertrophy of  the basal-septal segment. Left ventricular diastolic parameters are  indeterminate. There is hypokinesis of the left ventricular, basal-mid  anteroseptal wall and inferoseptal wall.   2. Right ventricular systolic function is normal. The right ventricular  size is normal.   3. The mitral valve is degenerative. Mild mitral valve regurgitation. No  evidence of mitral stenosis.   4. The aortic valve was not well visualized. Aortic valve regurgitation  is not visualized.   5. The inferior vena cava is normal in size with greater than 50%  respiratory variability, suggesting right atrial pressure of 3 mmHg.   Comparison(s): No prior Echocardiogram.   Conclusion(s)/Recommendation(s): Focal hypokinesis at basal septum with  preserved EF.    LEFT HEART CATH AND CORONARY ANGIOGRAPHY  03/22/21    Conclusion    Mid LM to Dist LM  lesion is 40% stenosed. Prox LAD lesion is 90% stenosed. Mid LAD to Dist LAD lesion is 99% stenosed. Prox RCA lesion is 50% stenosed. Dist RCA lesion is 30% stenosed. Prox RCA to Mid RCA lesion is 80% stenosed. RPDA lesion is 80% stenosed. Dist LAD-1 lesion is 99% stenosed. Dist LAD-2 lesion is 90% stenosed. Prox LAD to Mid LAD lesion is 40% stenosed with 40% stenosed side branch in 1st Diag. The left ventricular ejection fraction is greater than 65% by visual estimate.   Severe multivessel CAD with significant calcification in the proximal LAD with 40% smooth proximal stenosis followed by 90% stenosis with irregularity in the region of the first diagonal takeoff.  The LAD is subtotally occluded after a very tortuous large second diagonal vessel.   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   Large dominant RCA with 30% proximal stenosis followed by 50 and 80% eccentric mid stenoses with 30% stenosis before the PDA takeoff and 80% stenosis in the PDA.  The distal RCA supplies 4 large branches  which extend to the LV apex.   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   RECOMMENDATION: Angiograms will be reviewed with colleagues.  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  The Myoview study suggests significant viability to the LAD territory.  Consider surgical consultation for CABG revascularization.  EKG:  EKG is ordered today.  The ekg ordered today demonstrates sinus rhythm at rate of 80 bpm  Recent Labs: 03/21/2021: B Natriuretic Peptide 252.7 03/28/2021: BUN 14; Creatinine, Ser 0.84; Hemoglobin 10.1; Magnesium 1.9; Platelets 301; Potassium 4.0; Sodium 134  Recent Lipid Panel    Component Value Date/Time   CHOL 270 (H) 03/22/2021 0149   TRIG 140 03/22/2021 0149   HDL 62 03/22/2021 0149   CHOLHDL 4.4 03/22/2021 0149   VLDL 28 03/22/2021 0149   LDLCALC 180 (H) 03/22/2021 0149     VS:  BP 126/62   Pulse 80   Ht 5\' 3"  (1.6 m)   Wt 132 lb 12.8 oz (60.2 kg)   SpO2 97%   BMI 23.52 kg/m     Wt Readings from Last 3 Encounters:  04/19/21 132 lb 12.8 oz (60.2 kg)  04/18/21 132 lb (59.9 kg)  04/13/21 134 lb (60.8 kg)     GEN: Well nourished, well developed in no acute distress HEENT: Normal NECK: No JVD; No carotid bruits LYMPHATICS: No lymphadenopathy CARDIAC: RRR, no murmurs, rubs, gallops RESPIRATORY:  Clear to auscultation without rales, wheezing or rhonchi  ABDOMEN: Soft, non-tender, non-distended MUSCULOSKELETAL:  No edema; No deformity  SKIN: Warm and dry NEUROLOGIC:  Alert and oriented x 3 PSYCHIATRIC:  Normal affect   ASSESSMENT AND PLAN:    CAD s/p CABG Recovering well.  No anginal symptoms.  Not on aspirin as needed for anticoagulation.  2.  Possible HOCM - Noted on echo during admission with LVOT gradient  of 43 mmHg on TTE and peak gradient of 25 mmHg. -Plan to repeat echocardiogram in outpatient setting.  Will review timing with MD. - reviewed adequate hydration and alarming symptoms - Continue BB  3.  Postop atrial fibrillation -Maintaining sinus rhythm on amiodarone. -CHA2DS2-VASc score of 5.  On Eliquis for anticoagulation. -Seen in A. fib clinic and found amiodarone and  Eliquis likely temporarily. -Patient reported nausea which has improved today. -Reduce amiodarone to 100 mg daily -Will review length of therapy with MD  4.  Left pleural effusion status post thoracentesis -Resolved dyspnea.  Has follow-up chest x-ray in few weeks.  5.  Hyperlipidemia - 03/22/2021: Cholesterol 270; HDL 62; LDL Cholesterol 180; Triglycerides 140; VLDL 28  - On Zetia - Hx of statin intolerance - [place] to discuss PCSK9 inhibitor during next follow up   Medication Adjustments/Labs and Tests Ordered: Current medicines are reviewed at length with the patient today.  Concerns regarding medicines are outlined above.  Orders Placed This Encounter  Procedures   EKG 12-Lead    Meds ordered this encounter  Medications   amiodarone (PACERONE) 200 MG tablet    Sig: Take 0.5 tablets (100 mg total) by mouth daily.    Dispense:  15 tablet    Refill:  6    Dose decrease     Patient Instructions  Medication Instructions:  Your physician has recommended you make the following change in your medication: Decrease amiodarone to 100 mg by mouth daily.  This will be half of a 200 mg tablet  *If you need a refill on your cardiac medications before your next appointment, please call your pharmacy*   Lab Work: none If you have labs (blood work) drawn today and your tests are completely normal, you will receive your results only by: MyChart Message (if you have MyChart) OR A paper copy in the mail If you have any lab test that is abnormal or we need to change your treatment, we will call you to review the  results.   Testing/Procedures: none   Follow-Up: At [facility], you and your health needs are our priority.  As part of our continuing mission to provide you with exceptional heart care, we have created designated Provider Care Teams.  These Care Teams include your primary Cardiologist (physician) and Advanced Practice Providers (APPs -  Physician Assistants and Nurse Practitioners) who all work together to provide you with the care you need, when you need it.  We recommend signing up for the patient portal called "MyChart".  Sign up information is provided on this After Visit Summary.  MyChart is used to connect with patients for Virtual Visits (Telemedicine).  Patients are able to view lab/test results, encounter notes, upcoming appointments, etc.  Non-urgent messages can be sent to your provider as well.   To learn more about what you can do with MyChart, go to [email or web address].    Your next appointment:   September 14,2022 at 11:40  The format for your next appointment:   In Person  Provider:   Everette RankJay Varanasi, MD   Other Instructions    Signed, Manson PasseyBhavinkumar Croix Presley, PA  04/19/2021 12:27 PM    [facility]

## 2021-04-20 NOTE — Progress Notes (Signed)
80 year old lady presents for postoperative visitation status post CABG.  She has been doing well except reports some mild dyspnea on exertion she denies fevers or chills or chest pain  Physical exam: Well-appearing no acute distress BP (!) 190/80   Pulse 99   Resp 20   Ht 5\' 3"  (1.6 m)   Wt 60.8 kg   SpO2 94%   BMI 23.74 kg/m  Decreased breath sounds left base Cardiac regular rate and rhythm No peripheral edema Incisions healing well  Imaging: Moderate left effusion  Assessment/plan: Hold Eliquis for planned left chest thoracentesis by radiology Follow-up after thoracentesis with repeat chest x-ray  Trevian Hayashida Z. , MD [phone]

## 2021-05-03 ENCOUNTER — Other Ambulatory Visit: Payer: Self-pay | Admitting: Thoracic Surgery (Cardiothoracic Vascular Surgery)

## 2021-05-03 DIAGNOSIS — Z951 Presence of aortocoronary bypass graft: Secondary | ICD-10-CM

## 2021-05-05 ENCOUNTER — Ambulatory Visit (INDEPENDENT_AMBULATORY_CARE_PROVIDER_SITE_OTHER): Payer: Self-pay | Admitting: Thoracic Surgery (Cardiothoracic Vascular Surgery)

## 2021-05-05 ENCOUNTER — Ambulatory Visit
Admission: RE | Admit: 2021-05-05 | Discharge: 2021-05-05 | Disposition: A | Discharge status: Home / Self Care | Payer: Medicare Other | Source: Ambulatory Visit | Attending: Thoracic Surgery (Cardiothoracic Vascular Surgery) | Admitting: Thoracic Surgery (Cardiothoracic Vascular Surgery)

## 2021-05-05 ENCOUNTER — Other Ambulatory Visit: Payer: Self-pay

## 2021-05-05 VITALS — BP 160/96 | HR 84 | Resp 20 | Ht 63.0 in | Wt 132.0 lb

## 2021-05-05 DIAGNOSIS — Z951 Presence of aortocoronary bypass graft: Secondary | ICD-10-CM

## 2021-05-05 DIAGNOSIS — J90 Pleural effusion, not elsewhere classified: Secondary | ICD-10-CM

## 2021-05-05 NOTE — Progress Notes (Signed)
      [address]       [place] [address]             [phone]        RAESHAWN TAFOLLA [facility] Medical Record #[id number] Date of Birth: Mar 21, 1941  Referring: Corky Crafts, MD Primary Care: Ileana Ladd, MD Primary Cardiologist:Jayadeep Eldridge Dace, MD  Reason for visit:   follow-up  History of Present Illness:     80 year old female of Dr. Jannifer Franklin underwent coronary artery bypass grafting presents for follow-up.  She is undergone a thoracentesis previously for left-sided effusion.  She continues to have some shortness of breath.  Physical Exam: BP (!) 160/96   Pulse 84   Resp 20   Ht 5\' 3"  (1.6 m)   Wt 132 lb (59.9 kg)   SpO2 94% Comment: RA  BMI 23.38 kg/m      Diagnostic Studies & Laboratory data: CXR: Reaccumulation of the left-sided effusion.     Assessment / Plan:   80 year old female status post CABG.  Recurrent left-sided effusion.  I have made a referral to Dr. 94 for thoracentesis.  She is on Eliquis, and this will need to be held. Follow-up in 1 month in [place] clinic   Tonia Brooms 05/05/2021 5:25 PM

## 2021-05-05 NOTE — Telephone Encounter (Signed)
Called pt to see if she is interested in the cardiac rehab program, pt stated that she is interested but she has to get her lungs drained again and to call her back in October 2022 to schedule for cardiac rehab. Placed a note on pt ppw and placed her ppw in the black bin for ready to schedule. Will call pt in Oct to see if she is ready for cardiac rehab.

## 2021-05-08 ENCOUNTER — Encounter: Payer: Self-pay | Admitting: Pulmonary Disease

## 2021-05-08 ENCOUNTER — Other Ambulatory Visit: Payer: Self-pay

## 2021-05-08 ENCOUNTER — Ambulatory Visit (INDEPENDENT_AMBULATORY_CARE_PROVIDER_SITE_OTHER): Payer: Medicare Other

## 2021-05-08 ENCOUNTER — Ambulatory Visit: Payer: Medicare Other | Admitting: Pulmonary Disease

## 2021-05-08 ENCOUNTER — Other Ambulatory Visit (HOSPITAL_COMMUNITY)
Admission: RE | Admit: 2021-05-08 | Discharge: 2021-05-08 | Disposition: A | Discharge status: Home / Self Care | Payer: Medicare Other | Source: Ambulatory Visit | Attending: Pulmonary Disease | Admitting: Pulmonary Disease

## 2021-05-08 VITALS — BP 128/72 | HR 81 | Temp 98.1°F | Ht 63.0 in | Wt 131.0 lb

## 2021-05-08 DIAGNOSIS — J90 Pleural effusion, not elsewhere classified: Secondary | ICD-10-CM | POA: Insufficient documentation

## 2021-05-08 DIAGNOSIS — I480 Paroxysmal atrial fibrillation: Secondary | ICD-10-CM | POA: Diagnosis not present

## 2021-05-08 DIAGNOSIS — Z951 Presence of aortocoronary bypass graft: Secondary | ICD-10-CM | POA: Diagnosis not present

## 2021-05-08 LAB — BODY FLUID CELL COUNT WITH DIFFERENTIAL
Eos, Fluid: 24 %
Lymphs, Fluid: 61 %
Monocyte-Macrophage-Serous Fluid: 13 % — ABNORMAL LOW (ref 50–90)
Neutrophil Count, Fluid: 2 % (ref 0–25)
Total Nucleated Cell Count, Fluid: 4300 cu mm — ABNORMAL HIGH (ref 0–1000)

## 2021-05-08 NOTE — Progress Notes (Signed)
Synopsis: Referred in August 2022 for pleural effusion by Dr. Irving Copas, PCP: by Ileana Ladd, MD  Subjective:   PATIENT ID: Kimberly Calhoun GENDER: female DOB: 06/21/1941, MRN: [id number]  Chief Complaint  Patient presents with   Follow-up    Pt is in office today to have a thoracentesis performed.  Pt states that Kimberly Calhoun does have complaints of increased SOB.    [name], PMH CAD s/p CABG, CKD, HLD. Presents to the office s/p cabg with recurrent left sided effusion. Kimberly Calhoun was on eliquis that was held for todays office visit with plans for thoracentesis. From a respiratory standpoint Kimberly Calhoun is feeling ok but Kimberly Calhoun notices SOB and DOE at times.    Past Medical History:  Diagnosis Date   Arthritis    Chronic kidney disease, stage 3 (HCC)    Headache    Hypercholesteremia    Hyperlipidemia    Osteopenia    Personal history of colonic polyps      Family History  Problem Relation Age of Onset   Heart disease Father    Diabetes Brother    Breast cancer Neg Hx      Past Surgical History:  Procedure Laterality Date   ABDOMINAL HYSTERECTOMY  02/2013   COLONOSCOPY WITH PROPOFOL N/A 12/07/2014   Procedure: COLONOSCOPY WITH PROPOFOL;  Surgeon: Bernette Redbird, MD;  Location: [facility];  Service: Endoscopy;  Laterality: N/A;  ultraslim   CORONARY ARTERY BYPASS GRAFT N/A 03/24/2021   Procedure: CORONARY ARTERY BYPASS GRAFTING (CABG) TIMES THREE USING LEFT INTERNAL MAMMARY ARTERY AND RIGHT GREATER SAPHENOUS VEIN HARVESTED ENDOSCOPICALLY;  Surgeon: Linden Dolin, MD;  Location: [facility] OR;  Service: Open Heart Surgery;  Laterality: N/A;   IR THORACENTESIS ASP PLEURAL SPACE W/IMG GUIDE  04/17/2021   LEFT HEART CATH AND CORONARY ANGIOGRAPHY N/A 03/22/2021   Procedure: LEFT HEART CATH AND CORONARY ANGIOGRAPHY;  Surgeon: Lennette Bihari, MD;  Location: [facility]ASIVE [facility];  Service: Cardiovascular;  Laterality: N/A;   TEE WITHOUT CARDIOVERSION N/A 03/24/2021   Procedure: TRANSESOPHAGEAL ECHOCARDIOGRAM (TEE);   Surgeon: Linden Dolin, MD;  Location: [facility];  Service: Open Heart Surgery;  Laterality: N/A;    Social History   Socioeconomic History   Marital status: Married    Spouse name: Not on file   Number of children: Not on file   Years of education: Not on file   Highest education level: Not on file  Occupational History   Not on file  Tobacco Use   Smoking status: Never   Smokeless tobacco: Never  Substance and Sexual Activity   Alcohol use: No   Drug use: No   Sexual activity: Not on file  Other Topics Concern   Not on file  Social History Narrative   Not on file   Social Determinants of Health   Financial Resource Strain: Not on file  Food Insecurity: Not on file  Transportation Needs: Not on file  Physical Activity: Not on file  Stress: Not on file  Social Connections: Not on file  Intimate Partner Violence: Not on file     Allergies  Allergen Reactions   Macrobid [Nitrofurantoin] Other (See Comments)    Cold chills, muscle weakness.   Mold Extract [Trichophyton] Other (See Comments)    Allergies.    Statins Other (See Comments)   Other Other (See Comments)    Allergies.   Mold in Cheese - stuffy nose and headache  Chocolate- stuffy nose and headache  Mildew - stuffy  nose and headache     Outpatient Medications Prior to Visit  Medication Sig Dispense Refill   acetaminophen (TYLENOL) 500 MG tablet Take 500 mg by mouth every 6 (six) hours as needed for moderate pain or headache.     albuterol (VENTOLIN HFA) 108 (90 Base) MCG/ACT inhaler 1-2 puffs as needed     amiodarone (PACERONE) 200 MG tablet Take 0.5 tablets (100 mg total) by mouth daily. 15 tablet 6   apixaban (ELIQUIS) 5 MG TABS tablet Take 1 tablet (5 mg total) by mouth 2 (two) times daily. 60 tablet 1   CALCIUM PO Take 1 tablet by mouth at bedtime as needed.     cetirizine (ZYRTEC) 10 MG tablet Take 10 mg by mouth daily.     Cholecalciferol (VITAMIN D PO) Take 1 tablet by mouth every morning.      Cyanocobalamin 5000 MCG SUBL Place under the tongue.     ezetimibe (ZETIA) 10 MG tablet Take 1 tablet (10 mg total) by mouth daily. 30 tablet 1   metoprolol tartrate (LOPRESSOR) 25 MG tablet Take 1.5 tablets (37.5 mg total) by mouth 2 (two) times daily. 90 tablet 1   Multiple Vitamin (MULTIVITAMIN WITH MINERALS) TABS tablet Take 1 tablet by mouth at bedtime. Centrum silver.     Propylene Glycol (SYSTANE BALANCE OP) Apply 1 drop to eye daily as needed (dry eyes.).     vitamin C (ASCORBIC ACID) 500 MG tablet Take 500 mg by mouth every morning.     zinc gluconate 50 MG tablet Take 50 mg by mouth daily.     vitamin E 180 MG (400 UNITS) capsule Take 1 capsule (400 Units total) by mouth every morning.     ipratropium (ATROVENT) 0.03 % nasal spray Place 2 sprays into both nostrils 2 (two) times daily.     No facility-administered medications prior to visit.    Review of Systems  Constitutional:  Positive for malaise/fatigue. Negative for chills, fever and weight loss.  HENT:  Negative for hearing loss, sore throat and tinnitus.   Eyes:  Negative for blurred vision and double vision.  Respiratory:  Positive for shortness of breath. Negative for cough, hemoptysis, sputum production, wheezing and stridor.   Cardiovascular:  Negative for chest pain, palpitations, orthopnea, leg swelling and PND.  Gastrointestinal:  Negative for abdominal pain, constipation, diarrhea, heartburn, nausea and vomiting.  Genitourinary:  Negative for dysuria, hematuria and urgency.  Musculoskeletal:  Negative for joint pain and myalgias.  Skin:  Negative for itching and rash.  Neurological:  Negative for dizziness, tingling, weakness and headaches.  Endo/Heme/Allergies:  Negative for environmental allergies. Does not bruise/bleed easily.  Psychiatric/Behavioral:  Negative for depression. The patient is not nervous/anxious and does not have insomnia.   All other systems reviewed and are negative.   Objective:  Physical  Exam Vitals reviewed.  Constitutional:      General: Kimberly Calhoun is not in acute distress.    Appearance: Kimberly Calhoun is well-developed.  HENT:     Head: Normocephalic and atraumatic.  Eyes:     General: No scleral icterus.    Conjunctiva/sclera: Conjunctivae normal.     Pupils: Pupils are equal, round, and reactive to light.  Neck:     Vascular: No JVD.     Trachea: No tracheal deviation.  Cardiovascular:     Rate and Rhythm: Normal rate and regular rhythm.     Heart sounds: Normal heart sounds. No murmur heard. Pulmonary:     Effort: Pulmonary effort is normal.  No tachypnea, accessory muscle usage or respiratory distress.     Breath sounds: No stridor. No wheezing, rhonchi or rales.     Comments: Absent breaths sounds in the left base  Abdominal:     General: Bowel sounds are normal. There is no distension.     Palpations: Abdomen is soft.     Tenderness: There is no abdominal tenderness.  Musculoskeletal:        General: No tenderness.     Cervical back: Neck supple.  Lymphadenopathy:     Cervical: No cervical adenopathy.  Skin:    General: Skin is warm and dry.     Capillary Refill: Capillary refill takes less than 2 seconds.     Findings: No rash.  Neurological:     Mental Status: Kimberly Calhoun is alert and oriented to person, place, and time.  Psychiatric:        Behavior: Behavior normal.     Vitals:   05/08/21 1137  BP: 128/72  Pulse: 81  Temp: 98.1 F (36.7 C)  TempSrc: Oral  SpO2: 96%  Weight: 131 lb (59.4 kg)  Height: 5\' 3"  (1.6 m)   96% on RA BMI Readings from Last 3 Encounters:  05/08/21 23.21 kg/m  05/05/21 23.38 kg/m  04/19/21 23.52 kg/m   Wt Readings from Last 3 Encounters:  05/08/21 131 lb (59.4 kg)  05/05/21 132 lb (59.9 kg)  04/19/21 132 lb 12.8 oz (60.2 kg)     CBC    Component Value Date/Time   WBC 12.5 (H) 03/28/2021 0233   RBC 3.42 (L) 03/28/2021 0233   HGB 10.1 (L) 03/28/2021 0233   HCT 30.3 (L) 03/28/2021 0233   PLT 301 03/28/2021 0233   MCV  88.6 03/28/2021 0233   MCH 29.5 03/28/2021 0233   MCHC 33.3 03/28/2021 0233   RDW 14.9 03/28/2021 0233   LYMPHSABS 3.6 03/22/2021 0149   MONOABS 1.0 03/22/2021 0149   EOSABS 0.1 03/22/2021 0149   BASOSABS 0.1 03/22/2021 0149     Chest Imaging: CXR 05/05/2021: Left sided effusion The patient's images have been independently reviewed by me.    Pulmonary Functions Testing Results: No flowsheet data found.  FeNO:   Pathology:   Echocardiogram:   Heart Catheterization:     Assessment & Plan:     ICD-10-CM   1. Pleural effusion, left  J90 DG Chest 1 View    Cytology - Non PAP;    Protein, total    Lactate dehydrogenase    Glucose, pleural or peritoneal fluid    Glucose, pleural or peritoneal fluid    Lactate dehydrogenase    Protein, total    Cytology - Non PAP;    CANCELED: Body fluid cell count with differential    CANCELED: Glucose, pleural or peritoneal fluid    CANCELED: Lactate dehydrogenase    CANCELED: Protein, total    CANCELED: Body fluid cell count with differential    2. S/P CABG (coronary artery bypass graft)  Z95.1     3. S/P CABG x 3  Z95.1     4. Paroxysmal atrial fibrillation (HCC)  I48.0       Discussion:  [name], recurrent left sided effusion s/p thora x1 in the past. S/p CABG. Still having breathing troubles at times.   P: Discussed risks benefits and alternatives of thoracentesis in the office  Including bleeding and pneumothorax Kimberly Calhoun is agreeable to proceed.  Please see separate procedure note.   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   Please see orders for fluid analysis   Thanks for the referral    Current Outpatient Medications:    acetaminophen (TYLENOL) 500 MG tablet, Take 500 mg by mouth every 6 (six) hours as needed for moderate pain or headache., Disp: , Rfl:    albuterol (VENTOLIN HFA) 108 (90 Base) MCG/ACT inhaler, 1-2 puffs as  needed, Disp: , Rfl:    amiodarone (PACERONE) 200 MG tablet, Take 0.5 tablets (100 mg total) by mouth daily., Disp: 15 tablet, Rfl: 6   apixaban (ELIQUIS) 5 MG TABS tablet, Take 1 tablet (5 mg total) by mouth 2 (two) times daily., Disp: 60 tablet, Rfl: 1   CALCIUM PO, Take 1 tablet by mouth at bedtime as needed., Disp: , Rfl:    cetirizine (ZYRTEC) 10 MG tablet, Take 10 mg by mouth daily., Disp: , Rfl:    Cholecalciferol (VITAMIN D PO), Take 1 tablet by mouth every morning., Disp: , Rfl:    Cyanocobalamin 5000 MCG SUBL, Place under the tongue., Disp: , Rfl:    ezetimibe (ZETIA) 10 MG tablet, Take 1 tablet (10 mg total) by mouth daily., Disp: 30 tablet, Rfl: 1   metoprolol tartrate (LOPRESSOR) 25 MG tablet, Take 1.5 tablets (37.5 mg total) by mouth 2 (two) times daily., Disp: 90 tablet, Rfl: 1   Multiple Vitamin (MULTIVITAMIN WITH MINERALS) TABS tablet, Take 1 tablet by mouth at bedtime. Centrum silver., Disp: , Rfl:    Propylene Glycol (SYSTANE BALANCE OP), Apply 1 drop to eye daily as needed (dry eyes.)., Disp: , Rfl:    vitamin C (ASCORBIC ACID) 500 MG tablet, Take 500 mg by mouth every morning., Disp: , Rfl:    zinc gluconate 50 MG tablet, Take 50 mg by mouth daily., Disp: , Rfl:    vitamin E 180 MG (400 UNITS) capsule, Take 1 capsule (400 Units total) by mouth every morning., Disp: , Rfl:    Josephine Igo, DO [facility] 05/08/2021 5:10 PM

## 2021-05-08 NOTE — Progress Notes (Signed)
Thoracentesis  Procedure Note  Kimberly Calhoun  [id number]  October 08, 1940  Date:05/08/21  Time:12:27 PM   Provider Performing:[name] [name]   Procedure: Thoracentesis with imaging guidance (83094)  Indication(s) Pleural Effusion  Consent Risks of the procedure as well as the alternatives and risks of each were explained to the patient and/or caregiver.  Consent for the procedure was obtained and is signed in the bedside chart  Anesthesia Topical only with 1% lidocaine   Time Out Verified patient identification, verified procedure, site/side was marked, verified correct patient position, special equipment/implants available, medications/allergies/relevant history reviewed, required imaging and test results available.  Sterile Technique Maximal sterile technique including full sterile barrier drape, hand hygiene, sterile gown, sterile gloves, mask, hair covering, sterile ultrasound probe cover (if used).  Procedure Description Ultrasound was used to identify appropriate pleural anatomy for placement and overlying skin marked.  Area of drainage cleaned and draped in sterile fashion. Lidocaine was used to anesthetize the skin and subcutaneous tissue.  600 cc's of amber colored appearing fluid was drained from the left pleural space. Catheter then removed and bandaid applied to site.  Complications/Tolerance None; patient tolerated the procedure well. Chest X-ray is ordered to confirm no post-procedural complication.  EBL Minimal  Specimen(s) Pleural fluid       Josephine Igo, DO [facility] 05/08/2021 12:27 PM

## 2021-05-09 LAB — GLUCOSE, PLEURAL OR PERITONEAL FLUID: Glucose, Fluid: 107 mg/dL

## 2021-05-09 LAB — CYTOLOGY - NON PAP

## 2021-05-11 NOTE — Progress Notes (Signed)
Leigh, you can let her know that the pleural fluid that was sampled in the office is consistent with inflammatory cells post surgery.  I will let her thoracic surgeon know.  Thanks,  BLI  Josephine Igo, DO [facility] 05/11/2021 2:23 PM

## 2021-05-12 ENCOUNTER — Telehealth: Payer: Self-pay | Admitting: Pulmonary Disease

## 2021-05-12 NOTE — Telephone Encounter (Signed)
Call returned to patient, confirmed DOB. Patient states someone called this morning at 6 with the results of her thoracentesis. I made her aware we would get the message to Dr Tonia Brooms.   Patient aware BI is not in clinic today and response may be delayed.   BI please advise. Thanks

## 2021-05-13 ENCOUNTER — Emergency Department (HOSPITAL_COMMUNITY): Payer: Medicare Other

## 2021-05-13 ENCOUNTER — Encounter (HOSPITAL_COMMUNITY): Payer: Self-pay

## 2021-05-13 ENCOUNTER — Observation Stay (HOSPITAL_COMMUNITY)
Admission: EM | Admit: 2021-05-13 | Discharge: 2021-05-14 | Disposition: A | Discharge status: Home / Self Care | Payer: Medicare Other | Attending: Family Medicine | Admitting: Family Medicine

## 2021-05-13 DIAGNOSIS — E785 Hyperlipidemia, unspecified: Secondary | ICD-10-CM | POA: Diagnosis present

## 2021-05-13 DIAGNOSIS — J90 Pleural effusion, not elsewhere classified: Secondary | ICD-10-CM | POA: Insufficient documentation

## 2021-05-13 DIAGNOSIS — R0602 Shortness of breath: Secondary | ICD-10-CM | POA: Insufficient documentation

## 2021-05-13 DIAGNOSIS — Z20822 Contact with and (suspected) exposure to covid-19: Secondary | ICD-10-CM | POA: Diagnosis not present

## 2021-05-13 DIAGNOSIS — I4891 Unspecified atrial fibrillation: Secondary | ICD-10-CM | POA: Diagnosis not present

## 2021-05-13 DIAGNOSIS — I480 Paroxysmal atrial fibrillation: Principal | ICD-10-CM | POA: Insufficient documentation

## 2021-05-13 DIAGNOSIS — R0789 Other chest pain: Secondary | ICD-10-CM | POA: Diagnosis present

## 2021-05-13 DIAGNOSIS — Z7901 Long term (current) use of anticoagulants: Secondary | ICD-10-CM | POA: Diagnosis not present

## 2021-05-13 DIAGNOSIS — N183 Chronic kidney disease, stage 3 unspecified: Secondary | ICD-10-CM | POA: Diagnosis not present

## 2021-05-13 DIAGNOSIS — Z951 Presence of aortocoronary bypass graft: Secondary | ICD-10-CM | POA: Diagnosis not present

## 2021-05-13 DIAGNOSIS — Z79899 Other long term (current) drug therapy: Secondary | ICD-10-CM | POA: Diagnosis not present

## 2021-05-13 DIAGNOSIS — I2510 Atherosclerotic heart disease of native coronary artery without angina pectoris: Secondary | ICD-10-CM | POA: Diagnosis not present

## 2021-05-13 HISTORY — DX: Hyperlipidemia, unspecified: E78.5

## 2021-05-13 HISTORY — DX: Atherosclerotic heart disease of native coronary artery without angina pectoris: I25.10

## 2021-05-13 HISTORY — DX: Pleural effusion, not elsewhere classified: J90

## 2021-05-13 LAB — COMPREHENSIVE METABOLIC PANEL
ALT: 22 U/L (ref 0–44)
AST: 20 U/L (ref 15–41)
Albumin: 3.3 g/dL — ABNORMAL LOW (ref 3.5–5.0)
Alkaline Phosphatase: 125 U/L (ref 38–126)
Anion gap: 8 (ref 5–15)
BUN: 13 mg/dL (ref 8–23)
CO2: 23 mmol/L (ref 22–32)
Calcium: 8.9 mg/dL (ref 8.9–10.3)
Chloride: 105 mmol/L (ref 98–111)
Creatinine, Ser: 0.87 mg/dL (ref 0.44–1.00)
GFR, Estimated: >60 mL/min (ref >60)
Glucose, Bld: 110 mg/dL — ABNORMAL HIGH (ref 70–99)
Potassium: 4 mmol/L (ref 3.5–5.1)
Sodium: 136 mmol/L (ref 135–145)
Total Bilirubin: 0.6 mg/dL (ref 0.3–1.2)
Total Protein: 6.3 g/dL — ABNORMAL LOW (ref 6.5–8.1)

## 2021-05-13 LAB — BRAIN NATRIURETIC PEPTIDE: B Natriuretic Peptide: 377 pg/mL — ABNORMAL HIGH (ref 0.0–100.0)

## 2021-05-13 LAB — CBC WITH DIFFERENTIAL/PLATELET
Abs Immature Granulocytes: 0.03 10*3/uL (ref 0.00–0.07)
Basophils Absolute: 0.1 10*3/uL (ref 0.0–0.1)
Basophils Relative: 1 %
Eosinophils Absolute: 0.1 10*3/uL (ref 0.0–0.5)
Eosinophils Relative: 1 %
HCT: 37.8 % (ref 36.0–46.0)
Hemoglobin: 12.2 g/dL (ref 12.0–15.0)
Immature Granulocytes: 0 %
Lymphocytes Relative: 10 %
Lymphs Abs: 1.1 10*3/uL (ref 0.7–4.0)
MCH: 29.6 pg (ref 26.0–34.0)
MCHC: 32.3 g/dL (ref 30.0–36.0)
MCV: 91.7 fL (ref 80.0–100.0)
Monocytes Absolute: 0.9 10*3/uL (ref 0.1–1.0)
Monocytes Relative: 9 %
Neutro Abs: 8.8 10*3/uL — ABNORMAL HIGH (ref 1.7–7.7)
Neutrophils Relative %: 79 %
Platelets: 501 10*3/uL — ABNORMAL HIGH (ref 150–400)
RBC: 4.12 MIL/uL (ref 3.87–5.11)
RDW: 14.6 % (ref 11.5–15.5)
WBC: 11 10*3/uL — ABNORMAL HIGH (ref 4.0–10.5)
nRBC: 0 % (ref 0.0–0.2)

## 2021-05-13 LAB — TROPONIN I (HIGH SENSITIVITY)
Troponin I (High Sensitivity): 9 ng/L (ref <18)
Troponin I (High Sensitivity): 12 ng/L (ref <18)
Troponin I (High Sensitivity): 13 ng/L (ref <18)
Troponin I (High Sensitivity): 12 ng/L (ref <18)

## 2021-05-13 LAB — LIPASE, BLOOD: Lipase: 30 U/L (ref 11–51)

## 2021-05-13 LAB — MAGNESIUM: Magnesium: 2.2 mg/dL (ref 1.7–2.4)

## 2021-05-13 LAB — TSH: TSH: 4.202 u[IU]/mL (ref 0.350–4.500)

## 2021-05-13 MED ORDER — FUROSEMIDE 10 MG/ML IJ SOLN
20.0000 mg | Freq: Once | INTRAMUSCULAR | Status: AC
  Administered 2021-05-13: 20 mg via INTRAVENOUS
  Filled 2021-05-13: qty 2

## 2021-05-13 MED ORDER — APIXABAN 5 MG PO TABS
5.0000 mg | ORAL_TABLET | Freq: Two times a day (BID) | ORAL | Status: DC
  Administered 2021-05-13 – 2021-05-14 (×2): 5 mg via ORAL
  Filled 2021-05-13 (×2): qty 1

## 2021-05-13 MED ORDER — ASPIRIN EC 81 MG PO TBEC
81.0000 mg | DELAYED_RELEASE_TABLET | Freq: Every day | ORAL | Status: DC
  Administered 2021-05-14: 81 mg via ORAL
  Filled 2021-05-13 (×2): qty 1

## 2021-05-13 MED ORDER — ONDANSETRON HCL 4 MG PO TABS: 4.0000 mg | ORAL_TABLET | Freq: Four times a day (QID) | ORAL | Status: DC | PRN

## 2021-05-13 MED ORDER — POLYETHYLENE GLYCOL 3350 17 G PO PACK: 17.0000 g | PACK | Freq: Every day | ORAL | Status: DC | PRN

## 2021-05-13 MED ORDER — LORATADINE 10 MG PO TABS
10.0000 mg | ORAL_TABLET | Freq: Every day | ORAL | Status: DC
  Administered 2021-05-14: 10 mg via ORAL
  Filled 2021-05-13: qty 1

## 2021-05-13 MED ORDER — METOPROLOL TARTRATE 25 MG PO TABS
37.5000 mg | ORAL_TABLET | Freq: Two times a day (BID) | ORAL | Status: DC
  Administered 2021-05-13 – 2021-05-14 (×2): 37.5 mg via ORAL
  Filled 2021-05-13 (×2): qty 1

## 2021-05-13 MED ORDER — ALBUTEROL SULFATE HFA 108 (90 BASE) MCG/ACT IN AERS
1.0000 | INHALATION_SPRAY | Freq: Four times a day (QID) | RESPIRATORY_TRACT | Status: DC | PRN
  Filled 2021-05-13: qty 6.7

## 2021-05-13 MED ORDER — AMIODARONE HCL 200 MG PO TABS
100.0000 mg | ORAL_TABLET | Freq: Every day | ORAL | Status: DC
  Administered 2021-05-13 – 2021-05-14 (×2): 100 mg via ORAL
  Filled 2021-05-13 (×2): qty 1

## 2021-05-13 MED ORDER — METOPROLOL TARTRATE 5 MG/5ML IV SOLN: 2.5000 mg | INTRAVENOUS | Status: DC | PRN

## 2021-05-13 MED ORDER — METOPROLOL TARTRATE 5 MG/5ML IV SOLN
5.0000 mg | Freq: Once | INTRAVENOUS | Status: AC
  Administered 2021-05-13: 5 mg via INTRAVENOUS
  Filled 2021-05-13: qty 5

## 2021-05-13 MED ORDER — DOCUSATE SODIUM 100 MG PO CAPS
100.0000 mg | ORAL_CAPSULE | Freq: Two times a day (BID) | ORAL | Status: DC
  Filled 2021-05-13 (×3): qty 1

## 2021-05-13 MED ORDER — SODIUM CHLORIDE 0.9% FLUSH
3.0000 mL | Freq: Two times a day (BID) | INTRAVENOUS | Status: DC
  Administered 2021-05-13 – 2021-05-14 (×3): 3 mL via INTRAVENOUS

## 2021-05-13 MED ORDER — ACETAMINOPHEN 325 MG PO TABS
650.0000 mg | ORAL_TABLET | Freq: Four times a day (QID) | ORAL | Status: DC | PRN
  Administered 2021-05-14: 650 mg via ORAL
  Filled 2021-05-13: qty 2

## 2021-05-13 MED ORDER — HYDRALAZINE HCL 20 MG/ML IJ SOLN: 5.0000 mg | INTRAMUSCULAR | Status: DC | PRN

## 2021-05-13 MED ORDER — ACETAMINOPHEN 650 MG RE SUPP: 650.0000 mg | Freq: Four times a day (QID) | RECTAL | Status: DC | PRN

## 2021-05-13 MED ORDER — ONDANSETRON HCL 4 MG/2ML IJ SOLN: 4.0000 mg | Freq: Four times a day (QID) | INTRAMUSCULAR | Status: DC | PRN

## 2021-05-13 NOTE — ED Provider Notes (Signed)
I saw and evaluated the patient, reviewed the resident's note and I agree with the findings and plan.  EKG Interpretation  Date/Time:  Saturday May 13 2021 08:16:08 EDT Ventricular Rate:  141 PR Interval:    QRS Duration: 84 QT Interval:  303 QTC Calculation: 464 R Axis:   15 Text Interpretation: Atrial fibrillation with rapid V-rate Borderline low voltage, extremity leads Anteroseptal infarct, old Repolarization abnormality, prob rate related Confirmed by Lorre Nick ([id number]) on 05/13/2021 8:28:77 AM   80 year old female presents with chest pain which began yesterday.  Patient describes it as pressure.  Began noticing palpitations today.  She is in A. fib with RVR.  She has had this issue since having open heart surgery back in July.  Has been compliant with her Eliquis.  Plan will be to try to chemically convert patient, ACS work-up, likely admission   Lorre Nick, MD 05/13/21 [phone]

## 2021-05-13 NOTE — Progress Notes (Signed)
Patient called me to the room having mid/left side chest pressure radiating to her back, with shortness of breath, and mild diaphoresis and headache.   EKG obtained showing NSR with PAC's, rate in the 90's. BP normal, RR slightly increased at 24 and O2 92% on room air. O2 98% after applying 2L nasal canula. Pt had crackles on Left lung, spoke with MD Ophelia Charter and orders received for IV lasix, troponin level and cardiology to be consulted.   Will continue to monitor. Pt stable at this time. Pain rated as constant 4/10.

## 2021-05-13 NOTE — H&P (Signed)
History and Physical    Kimberly Calhoun WUJ:[id number] DOB: 08/24/1941 DOA: 05/13/2021  PCP: Ileana LaddWong, Francis P, MD Consultants:  [facility] - pulmonology; Cliffton AstersLightfoot - CT surgery; Eldridge DaceVaranasi - cardiology; [facility] Patient coming from:  Home - lives alone; NOK: Daughter, Misty StanleyLisa, [phone]  Chief Complaint: CP/palpitations  HPI: Kimberly Calhoun is a 80 y.o. female with medical history significant of stage 3 CKD; HLD; CAD s/p CABG in 03/2021; afib on Eliquis; and recurrent pleural effusion presenting with chest pain/palpitations.   She has afib with chest pressure and SOB.  She has had afib since her CABG as well as 2 lung drainage procedures.  They saw the afib in the post-operative period, but she has never actually had an issue with afib until now.  Symptoms started last night without exertion, she noticed chest pressure and went to bed.  She awoke about 0500 with pressure and could feel the afib.  She took her vitals and HR was 130.  She has been SOB before and after the surgery and this has not improved, unchanged.  No LE edema.     ED Course: Needs CP obs.  H/o recent CABG.  Has CP, SOB x 2 days.  Found to be in afib with RVR, has h/o the same - on Amio and Toprol.  Given IV Lopressor with improvement.  On Eliquis.  Has had recurrent pleural effusion, last drained on 8/22, improved on CXR today.  Troponin negative, BNP elevated with LE edema, off lasix since CABG.  Review of Systems: As per HPI; otherwise review of systems reviewed and negative.   Ambulatory Status:  Ambulates without assistance  COVID Vaccine Status:   Complete - J&J plus booster  Past Medical History:  Diagnosis Date   Arthritis    CAD (coronary artery disease)    s/p CABG   Chronic kidney disease, stage 3 (HCC)    Dyslipidemia 05/13/2021   Headache    Hypercholesteremia    Hyperlipidemia    Osteopenia    Personal history of colonic polyps    Pleural effusion    recurrent    Past Surgical History:   Procedure Laterality Date   ABDOMINAL HYSTERECTOMY  02/2013   COLONOSCOPY WITH PROPOFOL N/A 12/07/2014   Procedure: COLONOSCOPY WITH PROPOFOL;  Surgeon: Bernette Redbirdobert Buccini, MD;  [facility];  Service: Endoscopy;  Laterality: N/A;  ultraslim   CORONARY ARTERY BYPASS GRAFT N/A 03/24/2021   Procedure: CORONARY ARTERY BYPASS GRAFTING (CABG) TIMES THREE USING LEFT INTERNAL MAMMARY ARTERY AND RIGHT GREATER SAPHENOUS VEIN HARVESTED ENDOSCOPICALLY;  Surgeon: Linden DolinAtkins, Broadus Z, MD;  Location: [facility] OR;  Service: Open Heart Surgery;  Laterality: N/A;   IR THORACENTESIS ASP PLEURAL SPACE W/IMG GUIDE  04/17/2021   LEFT HEART CATH AND CORONARY ANGIOGRAPHY N/A 03/22/2021   Procedure: LEFT HEART CATH AND CORONARY ANGIOGRAPHY;  Surgeon: Lennette BihariKelly, Thomas A, MD;  Location: [facility] INVASIVE CV LAB;  Service: Cardiovascular;  Laterality: N/A;   TEE WITHOUT CARDIOVERSION N/A 03/24/2021   Procedure: TRANSESOPHAGEAL ECHOCARDIOGRAM (TEE);  Surgeon: Linden DolinAtkins, Broadus Z, MD;  [facility];  Service: Open Heart Surgery;  Laterality: N/A;    Social History   Socioeconomic History   Marital status: Widowed    Spouse name: Not on file   Number of children: 1   Years of education: Not on file   Highest education level: Not on file  Occupational History   Occupation: retired  Tobacco Use   Smoking status: Never   Smokeless tobacco: Never  Vaping  Use   Vaping Use: Never used  Substance and Sexual Activity   Alcohol use: No   Drug use: No   Sexual activity: Not on file  Other Topics Concern   Not on file  Social History Narrative   Not on file   Social Determinants of Health   Financial Resource Strain: Not on file  Food Insecurity: Not on file  Transportation Needs: Not on file  Physical Activity: Not on file  Stress: Not on file  Social Connections: Not on file  Intimate Partner Violence: Not on file    Allergies  Allergen Reactions   Macrobid [Nitrofurantoin] Other (See Comments)    Cold chills, muscle  weakness.   Mold Extract [Trichophyton] Other (See Comments)    Allergies.    Statins Other (See Comments)   Other Other (See Comments)    Allergies.   Mold in Cheese - stuffy nose and headache  Chocolate- stuffy nose and headache  Mildew - stuffy nose and headache    Family History  Problem Relation Age of Onset   Heart disease Father    Atrial fibrillation Sister    Diabetes Brother    Breast cancer Neg Hx     Prior to Admission medications   Medication Sig Start Date End Date Taking? Authorizing Provider  acetaminophen (TYLENOL) 500 MG tablet Take 500 mg by mouth every 6 (six) hours as needed for moderate pain or headache.    [provider]  albuterol (VENTOLIN HFA) 108 (90 Base) MCG/ACT inhaler 1-2 puffs as needed 12/05/20   [provider]  amiodarone (PACERONE) 200 MG tablet Take 0.5 tablets (100 mg total) by mouth daily. 04/19/21   Bhagat, Sharrell Ku, PA  apixaban (ELIQUIS) 5 MG TABS tablet Take 1 tablet (5 mg total) by mouth 2 (two) times daily. 03/30/21   Ardelle Balls, PA-C  CALCIUM PO Take 1 tablet by mouth at bedtime as needed.    [provider]  cetirizine (ZYRTEC) 10 MG tablet Take 10 mg by mouth daily.    [provider]  Cholecalciferol (VITAMIN D PO) Take 1 tablet by mouth every morning.    [provider]  Cyanocobalamin 5000 MCG SUBL Place under the tongue.    [provider]  ezetimibe (ZETIA) 10 MG tablet Take 1 tablet (10 mg total) by mouth daily. 03/31/21   Ardelle Balls, PA-C  metoprolol tartrate (LOPRESSOR) 25 MG tablet Take 1.5 tablets (37.5 mg total) by mouth 2 (two) times daily. 03/30/21   Ardelle Balls, PA-C  Multiple Vitamin (MULTIVITAMIN WITH MINERALS) TABS tablet Take 1 tablet by mouth at bedtime. Centrum silver.    [provider]  Propylene Glycol (SYSTANE BALANCE OP) Apply 1 drop to eye daily as needed (dry eyes.).    [provider]  vitamin C (ASCORBIC  ACID) 500 MG tablet Take 500 mg by mouth every morning.    [provider]  vitamin E 180 MG (400 UNITS) capsule Take 1 capsule (400 Units total) by mouth every morning. 04/24/21   Ardelle Balls, PA-C  zinc gluconate 50 MG tablet Take 50 mg by mouth daily.    [provider]    Physical Exam: Vitals:   05/13/21 1215 05/13/21 1230 05/13/21 1245 05/13/21 1330  BP: (!) 144/71 (!) 146/74 133/68 (!) 142/73  Pulse: 95 95 96 95  Resp: (!) 22 20 19 18   Temp:    98.7 F (37.1 C)  TempSrc:    Oral  SpO2:  95% 96% 96% 97%  Weight:    59 kg  Height:    5\' 3"  (1.6 m)     General:  Appears calm and comfortable and is in NAD Eyes:  PERRL, EOMI, normal lids, iris ENT:  grossly normal hearing, lips & tongue, mmm; appropriate dentition Neck:  no LAD, masses or thyromegaly Cardiovascular:  RRR, no m/r/g. No LE edema.  Respiratory:   CTA bilaterally with no wheezes/rales/rhonchi.  Normal respiratory effort. Abdomen:  soft, NT, ND Skin:  no rash or induration seen on limited exam Musculoskeletal:  grossly normal tone BUE/BLE, good ROM, no bony abnormality Psychiatric:  grossly normal mood and affect, speech fluent and appropriate, AOx3 Neurologic:  CN 2-12 grossly intact, moves all extremities in coordinated fashion    Radiological Exams on Admission: Independently reviewed - see discussion in A/P where applicable  DG Chest Portable 1 View  Result Date: 05/13/2021 CLINICAL DATA:  Chest pain. EXAM: PORTABLE CHEST 1 VIEW COMPARISON:  May 08, 2021. FINDINGS: The heart size and mediastinal contours are within normal limits. Sternotomy wires are noted. Right lung is clear. Small left pleural effusion is noted with associated left basilar atelectasis or infiltrate. The visualized skeletal structures are unremarkable. IMPRESSION: Small left pleural effusion with associated left basilar atelectasis or infiltrate. Electronically Signed   By: May 10, 2021 M.D.   On: 05/13/2021  09:25    EKG: Independently reviewed.   05/15/2021 - Afib with rate 141; nonspecific ST changes which may be rate related 1053 - NSR with rate 92; nonspecific ST changes with no evidence of acute ischemia   Labs on Admission: I have personally reviewed the available labs and imaging studies at the time of the admission.  Pertinent labs:   Glucose 110 Albumin 3.3 BNP 377.0; 252.7 on 03/21/21 HS troponin 9 WBC 11.0 Platelets 501   Assessment/Plan Principal Problem:   Atrial fibrillation with RVR (HCC) Active Problems:   S/P CABG x 3   Paroxysmal atrial fibrillation (HCC)   Pleural effusion, left   Dyslipidemia   Afib with RVR -Patient presenting with recurrent afib; she previously had her first event after her CABG in July.  -She developed RVR but this improved with a dose of IV beta blocker and she is back in sinus rhythm at this time. -Will plan to place in observation status on telemetry for ongoing monitoring -HS troponin negative; will not repeat. -Mildly elevated BNP with no clinical evidence of volume overload; prior echo on 7/8 post-operatively with preserved EF -Will repeat echo given recent CABG and new recurrence of afib -Will plan for outpatient cardiology evaluation in afib clinic unless more acute need arises while hospitalized -Heart rate is well controlled now that she is back in NSR -Continue Amiodarone, Lopressor -CHA2DS2-VASc Score is >2 and so patient would benefit from oral anticoagulation. There is evidence of net benefit even in the extremely elderly population. -Continue home Eliquis. -Based on her age >37 and weight <60 kg, she would be a candidate for lower dose Eliquis but given her new RVR and recent cardiac issues will leave on 5 mg BID for now.  Persistent SOB -She has had recurrent pleural effusion but this appears to be stable at this time after recent thoracentesis -However, she does have persistent SOB -This is not worse with the current  presentation, but it is possible that she has been having PAF leading to ongoing SOB -She would appreciate ongoing evaluation to determine a cause -Exercise intolerance post-CABG is also a  consideration; will order PT/OT evaluations -Continue as needed Albuterol  HLD -Reported statin intolerance -Takes Zetia daily; will hold due to limited inpatient utility -Consider outpatient referral to lipid clinic  CAD -s/p CABG in 03/2021 -Continue ASA -She did have chest tightness but this appeared to have been associated with RVR and is currently resolved -Negative troponin    Note: This patient has been tested and is pending for the novel coronavirus COVID-19. She has been fully vaccinated against COVID-19.    DVT prophylaxis: Eliquis Code Status:  Full - confirmed with patient/daughter Family Communication: Daughter was present throughout the evaluation Disposition Plan:  The patient is from: home  Anticipated d/c is to: home, possibly with [facility] services  Anticipated d/c date will depend on clinical response to treatment, but possibly as early as tomorrow if she has excellent response to treatment  Patient is currently: acutely ill Consults called: PT/OT  Admission status: It is my clinical opinion that referral for OBSERVATION is reasonable and necessary in this patient based on the above information provided. The aforementioned taken together are felt to place the patient at high risk for further clinical deterioration. However it is anticipated that the patient may be medically stable for discharge from the hospital within 24 to 48 hours.       Jonah Blue MD [facility]   How to contact the [facility] Attending or Consulting provider 7A - 7P or covering provider during after hours 7P -7A, for this patient?  Check the care team in [facility] and look for a) attending/consulting [facility] provider listed and b) the [facility] team listed Log into [email or web address] and use [place]'s universal  password to access. If you do not have the password, please contact the hospital operator. Locate the [facility] provider you are looking for under [facility] and page to a number that you can be directly reached. If you still have difficulty reaching the provider, please page the [facility] (Director on Call) for the Hospitalists listed on amion for assistance.   05/13/2021, 3:20 PM

## 2021-05-13 NOTE — Plan of Care (Signed)
  Problem: Education: Goal: Knowledge of disease or condition will improve Outcome: Progressing   Problem: Activity: Goal: Ability to tolerate increased activity will improve Outcome: Progressing   Problem: Cardiac: Goal: Ability to achieve and maintain adequate cardiopulmonary perfusion will improve Outcome: Progressing   Problem: Clinical Measurements: Goal: Ability to maintain clinical measurements within normal limits will improve Outcome: Progressing   Problem: Clinical Measurements: Goal: Will remain free from infection Outcome: Progressing   Problem: Clinical Measurements: Goal: Diagnostic test results will improve Outcome: Progressing   Problem: Clinical Measurements: Goal: Respiratory complications will improve Outcome: Progressing   Problem: Clinical Measurements: Goal: Cardiovascular complication will be avoided Outcome: Progressing   Problem: Coping: Goal: Level of anxiety will decrease Outcome: Progressing   Problem: Pain Managment: Goal: General experience of comfort will improve Outcome: Progressing   Problem: Safety: Goal: Ability to remain free from injury will improve Outcome: Progressing   Problem: Skin Integrity: Goal: Risk for impaired skin integrity will decrease Outcome: Progressing

## 2021-05-13 NOTE — ED Provider Notes (Signed)
[facility] EMERGENCY DEPARTMENT Provider Note   CSN: [id number] Arrival date & time: 05/13/21  9323     History Chief Complaint  Patient presents with   Chest Pain    Kimberly Calhoun is a 80 y.o. female.  Patient is an 80 year old female with a past medical history of CAD status post CABG, CKD, HLD that is presenting for chest pain and shortness of breath.  Patient states for the last 2 days she has had chest pressure in the middle of her chest.  She denies the chest pain radiating.  She denies any nausea or diaphoresis with this chest pain.  She does endorse feeling short of breath with the chest pressure.  She describes the chest pressure as a band around her chest.  She has a history of A. fib in which she takes metoprolol and amiodarone.  She takes Eliquis daily.  She has had 2 pleural effusions since the CABG.  On 8/22 she had a thoracentesis on her left lung.    Patient denies any fevers, chills, headache, neck stiffness, nausea, vomiting, diarrhea, numbness or weakness.  Chest Pain Associated symptoms: shortness of breath   Associated symptoms: no abdominal pain, no cough, no diaphoresis, no dizziness, no dysphagia, no fatigue, no fever, no headache, no nausea, no numbness, no palpitations, no vomiting and no weakness       Past Medical History:  Diagnosis Date   Arthritis    Chronic kidney disease, stage 3 (HCC)    Headache    Hypercholesteremia    Hyperlipidemia    Osteopenia    Personal history of colonic polyps     Patient Active Problem List   Diagnosis Date Noted   Pleural effusion, left 05/08/2021   Paroxysmal atrial fibrillation (HCC) 04/11/2021   Secondary hypercoagulable state (HCC) 04/11/2021   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/2021   Coronary artery disease 03/24/2021   NSTEMI (non-ST elevated myocardial infarction) (HCC)    Chest pain 03/21/2021    Past Surgical History:  Procedure Laterality Date   ABDOMINAL HYSTERECTOMY  02/2013    COLONOSCOPY WITH PROPOFOL N/A 12/07/2014   Procedure: COLONOSCOPY WITH PROPOFOL;  Surgeon: Bernette Redbird, MD;  Location: [facility];  Service: Endoscopy;  Laterality: N/A;  ultraslim   CORONARY ARTERY BYPASS GRAFT N/A 03/24/2021   Procedure: CORONARY ARTERY BYPASS GRAFTING (CABG) TIMES THREE USING LEFT INTERNAL MAMMARY ARTERY AND RIGHT GREATER SAPHENOUS VEIN HARVESTED ENDOSCOPICALLY;  Surgeon: Linden Dolin, MD;  Location: [facility] OR;  Service: Open Heart Surgery;  Laterality: N/A;   IR THORACENTESIS ASP PLEURAL SPACE W/IMG GUIDE  04/17/2021   LEFT HEART CATH AND CORONARY ANGIOGRAPHY N/A 03/22/2021   Procedure: LEFT HEART CATH AND CORONARY ANGIOGRAPHY;  Surgeon: Lennette Bihari, MD;  Location: [facility] INVASIVE CV LAB;  Service: Cardiovascular;  Laterality: N/A;   TEE WITHOUT CARDIOVERSION N/A 03/24/2021   Procedure: TRANSESOPHAGEAL ECHOCARDIOGRAM (TEE);  Surgeon: Linden Dolin, MD;  Location: [facility];  Service: Open Heart Surgery;  Laterality: N/A;     OB History   No obstetric history on file.     Family History  Problem Relation Age of Onset   Heart disease Father    Diabetes Brother    Breast cancer Neg Hx     Social History   Tobacco Use   Smoking status: Never   Smokeless tobacco: Never  Vaping Use   Vaping Use: Never used  Substance Use Topics   Alcohol use: No   Drug use: No  Home Medications Prior to Admission medications   Medication Sig Start Date End Date Taking? Authorizing Provider  acetaminophen (TYLENOL) 500 MG tablet Take 500 mg by mouth every 6 (six) hours as needed for moderate pain or headache.    [provider]  albuterol (VENTOLIN HFA) 108 (90 Base) MCG/ACT inhaler 1-2 puffs as needed 12/05/20   [provider]  amiodarone (PACERONE) 200 MG tablet Take 0.5 tablets (100 mg total) by mouth daily. 04/19/21   Bhagat, Sharrell Ku, PA  apixaban (ELIQUIS) 5 MG TABS tablet Take 1 tablet (5 mg total) by mouth 2 (two) times daily. 03/30/21   Ardelle Balls, PA-C  CALCIUM PO Take 1 tablet by mouth at bedtime as needed.    [provider]  cetirizine (ZYRTEC) 10 MG tablet Take 10 mg by mouth daily.    [provider]  Cholecalciferol (VITAMIN D PO) Take 1 tablet by mouth every morning.    [provider]  Cyanocobalamin 5000 MCG SUBL Place under the tongue.    [provider]  ezetimibe (ZETIA) 10 MG tablet Take 1 tablet (10 mg total) by mouth daily. 03/31/21   Ardelle Balls, PA-C  metoprolol tartrate (LOPRESSOR) 25 MG tablet Take 1.5 tablets (37.5 mg total) by mouth 2 (two) times daily. 03/30/21   Ardelle Balls, PA-C  Multiple Vitamin (MULTIVITAMIN WITH MINERALS) TABS tablet Take 1 tablet by mouth at bedtime. Centrum silver.    [provider]  Propylene Glycol (SYSTANE BALANCE OP) Apply 1 drop to eye daily as needed (dry eyes.).    [provider]  vitamin C (ASCORBIC ACID) 500 MG tablet Take 500 mg by mouth every morning.    [provider]  vitamin E 180 MG (400 UNITS) capsule Take 1 capsule (400 Units total) by mouth every morning. 04/24/21   Ardelle Balls, PA-C  zinc gluconate 50 MG tablet Take 50 mg by mouth daily.    [provider]    Allergies    Macrobid [nitrofurantoin], Mold extract [trichophyton], Statins, and Other  Review of Systems   Review of Systems  Constitutional:  Negative for chills, diaphoresis, fatigue and fever.  HENT:  Negative for congestion, dental problem, ear discharge, ear pain, facial swelling, hearing loss, nosebleeds, postnasal drip, rhinorrhea, sinus pain, sneezing, sore throat and trouble swallowing.   Eyes:  Negative for pain and visual disturbance.  Respiratory:  Positive for shortness of breath. Negative for cough, chest tightness, wheezing and stridor.   Cardiovascular:  Positive for chest pain. Negative for palpitations and leg swelling.  Gastrointestinal:  Negative for abdominal distention, abdominal  pain, blood in stool, constipation, diarrhea, nausea and vomiting.  Endocrine: Negative for polydipsia and polyuria.  Genitourinary:  Negative for difficulty urinating, dysuria, flank pain, frequency, hematuria, urgency, vaginal bleeding and vaginal discharge.  Musculoskeletal:  Negative for myalgias, neck pain and neck stiffness.  Skin:  Negative for rash and wound.  Allergic/Immunologic: Negative for environmental allergies and food allergies.  Neurological:  Negative for dizziness, seizures, syncope, facial asymmetry, speech difficulty, weakness, light-headedness, numbness and headaches.  Psychiatric/Behavioral:  Negative for agitation, behavioral problems and confusion.    Physical Exam Updated Vital Signs BP 122/65   Pulse (!) 106   Temp 98.6 F (37 C) (Oral)   Resp (!) 22   Ht 5\' 3"  (1.6 m)   Wt 59.4 kg   SpO2 96%   BMI 23.21 kg/m   Physical Exam Vitals and nursing note reviewed.  Constitutional:  General: She is not in acute distress.    Appearance: Normal appearance. She is normal weight. She is not ill-appearing.  HENT:     Head: Normocephalic and atraumatic.     Right Ear: External ear normal.     Left Ear: External ear normal.     Nose: Nose normal. No congestion.     Mouth/Throat:     Mouth: Mucous membranes are moist.     Pharynx: Oropharynx is clear. No oropharyngeal exudate or posterior oropharyngeal erythema.  Eyes:     General: No visual field deficit.    Extraocular Movements: Extraocular movements intact.     Conjunctiva/sclera: Conjunctivae normal.     Pupils: Pupils are equal, round, and reactive to light.  Neck:     Vascular: No carotid bruit or JVD.  Cardiovascular:     Rate and Rhythm: Normal rate and regular rhythm.     Pulses: Normal pulses.     Heart sounds: Normal heart sounds. No murmur heard. Pulmonary:     Effort: Pulmonary effort is normal. No respiratory distress.     Breath sounds: Normal breath sounds. No stridor. No wheezing,  rhonchi or rales.  Chest:     Chest wall: No tenderness.  Abdominal:     General: Bowel sounds are normal. There is no distension.     Palpations: Abdomen is soft.     Tenderness: There is no abdominal tenderness. There is no right CVA tenderness, left CVA tenderness, guarding or rebound.  Musculoskeletal:        General: No swelling or tenderness. Normal range of motion.     Cervical back: Normal range of motion and neck supple. No rigidity, tenderness or bony tenderness.     Thoracic back: Normal. No tenderness or bony tenderness.     Lumbar back: Normal. No tenderness or bony tenderness.     Right lower leg: Edema present.     Left lower leg: Edema present.  Skin:    General: Skin is warm and dry.     Coloration: Skin is not jaundiced.  Neurological:     General: No focal deficit present.     Mental Status: She is alert and oriented to person, place, and time. Mental status is at baseline.     Cranial Nerves: Cranial nerves are intact. No cranial nerve deficit, dysarthria or facial asymmetry.     Sensory: Sensation is intact. No sensory deficit.     Motor: Motor function is intact. No weakness.     Coordination: Coordination is intact. Finger-Nose-Finger Test normal.     Gait: Gait is intact. Gait normal.  Psychiatric:        Mood and Affect: Mood normal.        Behavior: Behavior normal.        Thought Content: Thought content normal.        Judgment: Judgment normal.    ED Results / Procedures / Treatments   Labs (all labs ordered are listed, but only abnormal results are displayed) Labs Reviewed  CBC WITH DIFFERENTIAL/PLATELET  COMPREHENSIVE METABOLIC PANEL  LIPASE, BLOOD  BRAIN NATRIURETIC PEPTIDE  MAGNESIUM  TROPONIN I (HIGH SENSITIVITY)    EKG EKG Interpretation  Date/Time:  Saturday May 13 2021 08:16:08 EDT Ventricular Rate:  141 PR Interval:    QRS Duration: 84 QT Interval:  303 QTC Calculation: 464 R Axis:   15 Text Interpretation: Atrial  fibrillation with rapid V-rate Borderline low voltage, extremity leads Anteroseptal infarct, old Repolarization abnormality, prob rate  related Confirmed by Lorre NickAllen, Anthony ([id number]) on 05/13/2021 8:59:41 AM  Radiology No results found.  Procedures Procedures   Medications Ordered in ED Medications  metoprolol tartrate (LOPRESSOR) injection 5 mg (has no administration in time range)    ED Course  I have reviewed the triage vital signs and the nursing notes.  Pertinent labs & imaging results that were available during my care of the patient were reviewed by me and considered in my medical decision making (see chart for details).    MDM Rules/Calculators/A&P                         Mosetta Aniseresa B Eveland is a 80 y.o. female with past medical history of CAD status post CABG, CKD, HLD that is presenting for chest pain and shortness of breath.  Patient states that she has had the symptoms for 2 days.  She describes the chest pain as a pressure in the middle of her chest.  She also endorses shortness of breath associated with the symptoms.  She takes amiodarone and metoprolol for her atrial fibrillation.  She takes Eliquis daily.  She has been compliant with this medication.  She believes that she has been in A. fib since yesterday.  She recently had a thoracentesis on 8/22 for left lung pleural effusion.  At this time on physical exam there are no signs of JVD.  However, there is bilateral lower extremity pitting edema.  CXR showed small left pleural effusion.   Patient's EKG is notable for atrial fibrillation with RVR.  Patient was given IV metoprolol 5 mg.  Patient's repeat EKG shows sinus rhythm with a heart rate of 92 after receiving 1 dose of IV metoprolol 5 mg.  Her BNP was elevated and she recently stopped taking lasix. Troponin negative x2.   Patient will be admitted to hospitalist for further evaluation.   Patient states compliance and understanding of the plan. I explained labs and imaging  to the patient. No further questions at this time from the patient.  The plan for this patient was discussed with Dr. Freida BusmanAllen, who voiced agreement and who oversaw evaluation and treatment of this patient.   Final Clinical Impression(s) / ED Diagnoses Final diagnoses:  Atrial fibrillation with RVR [facility](HCC)    Rx / DC Orders ED Discharge Orders     None        Lottie DawsonByouk, Leshae Mcclay, MD 05/13/21 [id number]    Lorre NickAllen, Anthony, MD 05/16/21 [id number]

## 2021-05-13 NOTE — ED Triage Notes (Signed)
CABG on July 2022 and left lung drained twice for pleural effusion since after surgery. Pt reports chest pain since yesterday with palpitations with dyspnea on exertion. Alert and oriented x 4.

## 2021-05-13 NOTE — Progress Notes (Signed)
Patient with recurrent left-sided CP with SOB.  EKG performed and without obvious ischemia.  Will order repeat troponin and Lasix x 20 mg (left-sided crackles, per RN).  While previously not planning for inpatient cardiology consult, will order now.    Georgana Curio, M.D.

## 2021-05-14 ENCOUNTER — Other Ambulatory Visit: Payer: Self-pay

## 2021-05-14 ENCOUNTER — Observation Stay (HOSPITAL_BASED_OUTPATIENT_CLINIC_OR_DEPARTMENT_OTHER): Payer: Medicare Other

## 2021-05-14 DIAGNOSIS — I4891 Unspecified atrial fibrillation: Secondary | ICD-10-CM

## 2021-05-14 LAB — ECHOCARDIOGRAM COMPLETE
Area-P 1/2: 3.77 cm2
Height: 63 in
S' Lateral: 1.9 cm
Weight: 2081.14 oz

## 2021-05-14 LAB — BASIC METABOLIC PANEL
Anion gap: 6 (ref 5–15)
BUN: 11 mg/dL (ref 8–23)
CO2: 26 mmol/L (ref 22–32)
Calcium: 8.8 mg/dL — ABNORMAL LOW (ref 8.9–10.3)
Chloride: 102 mmol/L (ref 98–111)
Creatinine, Ser: 0.84 mg/dL (ref 0.44–1.00)
GFR, Estimated: >60 mL/min (ref >60)
Glucose, Bld: 102 mg/dL — ABNORMAL HIGH (ref 70–99)
Potassium: 3.8 mmol/L (ref 3.5–5.1)
Sodium: 134 mmol/L — ABNORMAL LOW (ref 135–145)

## 2021-05-14 LAB — SARS CORONAVIRUS 2 (TAT 6-24 HRS): SARS Coronavirus 2: NEGATIVE

## 2021-05-14 LAB — CBC
HCT: 34.5 % — ABNORMAL LOW (ref 36.0–46.0)
Hemoglobin: 11.3 g/dL — ABNORMAL LOW (ref 12.0–15.0)
MCH: 29 pg (ref 26.0–34.0)
MCHC: 32.8 g/dL (ref 30.0–36.0)
MCV: 88.5 fL (ref 80.0–100.0)
Platelets: 500 10*3/uL — ABNORMAL HIGH (ref 150–400)
RBC: 3.9 MIL/uL (ref 3.87–5.11)
RDW: 14.6 % (ref 11.5–15.5)
WBC: 10 10*3/uL (ref 4.0–10.5)
nRBC: 0 % (ref 0.0–0.2)

## 2021-05-14 MED ORDER — METOPROLOL TARTRATE 25 MG PO TABS: 50.0000 mg | ORAL_TABLET | Freq: Two times a day (BID) | ORAL | 0 refills | Status: DC

## 2021-05-14 NOTE — Discharge Summary (Signed)
Physician Discharge Summary  Kimberly Calhoun ZOX:[id number] DOB: 09-May-1941 DOA: 05/13/2021  PCP: Kimberly Ladd, MD  Admit date: 05/13/2021 Discharge date: 05/14/2021  Time spent: 40 minutes  Recommendations for Outpatient Follow-up:  Follow outpatient CBC/CMP Follow SOB/pleural effusion outpatient with PCP/pulm/CT surgery Follow afib clinic - consider eliquis dosing   Discharge Diagnoses:  Principal Problem:   Atrial fibrillation with RVR (HCC) Active Problems:   S/P CABG x 3   Paroxysmal atrial fibrillation (HCC)   Pleural effusion, left   Dyslipidemia   Discharge Condition: stable  Diet recommendation: heart healthy  Filed Weights   05/13/21 0817 05/13/21 1330  Weight: 59.4 kg 59 kg    History of present illness:  80 yo F hx CKD III, CAD s/p CABG in 03/2021, afib on eliquis, recurrent pleural effusion who presented with chest discomfort and palpitations.  She was found to have afib with RVR.  Her symptoms have improved with treatment.  Cardiology was c/s.  See below for additional details  Hospital Course:  Afib with RVR -Patient presenting with recurrent afib; she previously had her first event after her CABG in July.  -She developed RVR but this improved with Kimberly Calhoun dose of IV beta blocker and she is back in sinus rhythm at this time. -Will repeat echo given recent CABG and new recurrence of afib -> as below (mildly reduced RVSF - see report) - received dose of lasix -cardiology c/s, appreciate recs -> increase metop to 50 mg BID, amiodarone 100 mg daily, eliquis 5 mg BID -> will need to follow up with afib clinic for dosing as she's potentially candidate for lower dose -Heart rate is well controlled now that she is back in NSR -Continue Amiodarone, Lopressor   Persistent SOB  Left Effusion -She has had recurrent pleural effusion but this appears to be stable at this time after recent thoracentesis -However, she does have persistent SOB - daughter notes this has been  stable, pre post CABG relatively - discussed possible thora in house, they prefer to discharge home - she's able to maintain sats on RA, discussed need to follow up with PCP/pulm  -PT/OT evaluations - no therapy needs -follow outpatient   HLD -Reported statin intolerance -resume zetia outpatient  -Consider outpatient referral to lipid clinic - defer to cards   CAD -s/p CABG in 03/2021 -Continue ASA -She did have chest tightness but this appeared to have been associated with RVR and is currently resolved -Negative troponin -appreciate cardiology recs   Procedures: Echo IMPRESSIONS     1. Left ventricular ejection fraction, by estimation, is 65 to 70%. The  left ventricle has normal function. The left ventricle has no regional  wall motion abnormalities. Left ventricular diastolic parameters are  consistent with Grade I diastolic  dysfunction (impaired relaxation). Elevated left ventricular end-diastolic  pressure.   2. Right ventricular systolic function is mildly reduced. The right  ventricular size is normal. There is normal pulmonary artery systolic  pressure. The estimated right ventricular systolic pressure is 27.4 mmHg.   3. Large pleural effusion in the left lateral region.   4. The mitral valve is normal in structure. No evidence of mitral valve  regurgitation. No evidence of mitral stenosis.   5. The aortic valve is tricuspid. Aortic valve regurgitation is not  visualized. Mild aortic valve sclerosis is present, with no evidence of  aortic valve stenosis.   6. The inferior vena cava is normal in size with greater than 50%  respiratory variability, suggesting right  atrial pressure of 3 mmHg.   Consultations: cardiology  Discharge Exam: Vitals:   05/14/21 0807 05/14/21 1038  BP: 133/79 112/64  Pulse: 95 74  Resp:  17  Temp:  98 F (36.7 C)  SpO2: 95% 97%   Eager to discharge Discussed staying for possible thora, she prefers to d/c - discussed importance of  follow up for eval for need for repeat thora  General: No acute distress. Cardiovascular: RRR Lungs: decreased at L base Abdomen: Soft, nontender, nondistended Neurological: Alert and oriented 3. Moves all extremities 4  Cranial nerves II through XII grossly intact. Skin: Warm and dry. No rashes or lesions. Extremities: No clubbing or cyanosis. No edema.  Discharge Instructions   Discharge Instructions     Amb referral to AFIB Clinic   Complete by: As directed    Call MD for:  difficulty breathing, headache or visual disturbances   Complete by: As directed    Call MD for:  extreme fatigue   Complete by: As directed    Call MD for:  hives   Complete by: As directed    Call MD for:  persistant dizziness or light-headedness   Complete by: As directed    Call MD for:  persistant nausea and vomiting   Complete by: As directed    Call MD for:  redness, tenderness, or signs of infection (pain, swelling, redness, odor or green/yellow discharge around incision site)   Complete by: As directed    Call MD for:  severe uncontrolled pain   Complete by: As directed    Call MD for:  temperature >100.4   Complete by: As directed    Diet - low sodium heart healthy   Complete by: As directed    Discharge instructions   Complete by: As directed    You were seen for atrial fibrillation and shortness of breath.   You've improved and have returned to Kimberly Calhoun normal rhythm.  You were seen by cardiology and had Kimberly Calhoun repeat echocardiogram during this admission which shows Kimberly Calhoun normal ejection fraction (pump/squeeze) and grade 1 diastolic dysfunction (impaired relaxation).  The right ventricle systolic function is mildly reduced.  Please follow up with your cardiologist as an outpatient to discuss these results.  Continue your eliquis and amiodarone.  We'll increase your metoprolol to 50 mg twice daily.  Please discuss your eliquis dosing with the atrial fibrillation clinic as an outpatient.  You still have  Kimberly Calhoun pleural effusion on the left.  Please follow up with your PCP as an outpatient (or Dr. Tonia Calhoun or your CT surgion).  This needs to be followed to see if you need another thoracentesis.  Return for new, recurrent, or worsening symptoms.  Please ask your PCP to request records from this hospitalization so they know what was done and what the next steps will be.   Increase activity slowly   Complete by: As directed    No wound care   Complete by: As directed       Allergies as of 05/14/2021       Reactions   Macrobid [nitrofurantoin] Other (See Comments)   Cold chills, muscle weakness.   Mold Extract [trichophyton] Other (See Comments)   Allergies.    Statins Other (See Comments)   Other Other (See Comments)   Allergies.  Mold in Cheese - stuffy nose and headache  Chocolate- stuffy nose and headache  Mildew - stuffy nose and headache        Medication List  TAKE these medications    acetaminophen 500 MG tablet Commonly known as: TYLENOL Take 500 mg by mouth every 6 (six) hours as needed for moderate pain or headache.   albuterol 108 (90 Base) MCG/ACT inhaler Commonly known as: VENTOLIN HFA Inhale 1 puff into the lungs every 6 (six) hours as needed for shortness of breath.   amiodarone 200 MG tablet Commonly known as: PACERONE Take 0.5 tablets (100 mg total) by mouth daily.   apixaban 5 MG Tabs tablet Commonly known as: ELIQUIS Take 1 tablet (5 mg total) by mouth 2 (two) times daily.   aspirin 81 MG EC tablet Take 81 mg by mouth daily. Swallow whole.   CALCIUM PO Take 1 tablet by mouth at bedtime as needed (For bones).   cetirizine 10 MG tablet Commonly known as: ZYRTEC Take 10 mg by mouth daily.   ezetimibe 10 MG tablet Commonly known as: ZETIA Take 1 tablet (10 mg total) by mouth daily.   metoprolol tartrate 25 MG tablet Commonly known as: LOPRESSOR Take 2 tablets (50 mg total) by mouth 2 (two) times daily. What changed: how much to take    multivitamin with minerals Tabs tablet Take 1 tablet by mouth at bedtime. Centrum silver.   SYSTANE BALANCE OP Apply 1 drop to eye daily as needed (dry eyes.).   vitamin C 500 MG tablet Commonly known as: ASCORBIC ACID Take 500 mg by mouth every morning.   VITAMIN D PO Take 1 tablet by mouth every morning.   vitamin E 180 MG (400 UNITS) capsule Take 1 capsule (400 Units total) by mouth every morning. What changed: when to take this   zinc gluconate 50 MG tablet Take 50 mg by mouth daily.       Allergies  Allergen Reactions   Macrobid [Nitrofurantoin] Other (See Comments)    Cold chills, muscle weakness.   Mold Extract [Trichophyton] Other (See Comments)    Allergies.    Statins Other (See Comments)   Other Other (See Comments)    Allergies.   Mold in Cheese - stuffy nose and headache  Chocolate- stuffy nose and headache  Mildew - stuffy nose and headache      The results of significant diagnostics from this hospitalization (including imaging, microbiology, ancillary and laboratory) are listed below for reference.    Significant Diagnostic Studies: DG Chest 1 View  Result Date: 05/08/2021 CLINICAL DATA:  Status post thoracentesis. EXAM: CHEST  1 VIEW COMPARISON:  May 05, 2021. FINDINGS: No pneumothorax is noted. Left pleural effusion is significantly smaller. IMPRESSION: Left pleural effusion is significantly smaller status post thoracentesis. No pneumothorax is seen. Electronically Signed   By: Lupita Raider M.D.   On: 05/08/2021 12:59   DG Chest 1 View  Result Date: 04/17/2021 CLINICAL DATA:  Post thoracentesis EXAM: CHEST  1 VIEW COMPARISON:  None. FINDINGS: Prior CABG. Mild hyperinflation. Left basilar scarring. Right lung clear. No effusions or acute bony abnormality. IMPRESSION: No active disease. Electronically Signed   By: Charlett Nose M.D.   On: 04/17/2021 10:50   DG Chest 2 View  Result Date: 05/05/2021 CLINICAL DATA:  Status post CABG, shortness of  breath EXAM: CHEST - 2 VIEW COMPARISON:  04/17/2021 FINDINGS: Moderate left pleural effusion with associated atelectasis, which is new from the prior exam. The right lung is clear. Prior median sternotomy and CABG. Unchanged cardiac and mediastinal contours. No acute osseous abnormality. IMPRESSION: New moderate left pleural effusion with associated atelectasis. Electronically Signed   By:  Wiliam KeAlison  Vasan M.D.   On: 05/05/2021 13:15   DG Chest Portable 1 View  Result Date: 05/13/2021 CLINICAL DATA:  Chest pain. EXAM: PORTABLE CHEST 1 VIEW COMPARISON:  May 08, 2021. FINDINGS: The heart size and mediastinal contours are within normal limits. Sternotomy wires are noted. Right lung is clear. Small left pleural effusion is noted with associated left basilar atelectasis or infiltrate. The visualized skeletal structures are unremarkable. IMPRESSION: Small left pleural effusion with associated left basilar atelectasis or infiltrate. Electronically Signed   By: Lupita RaiderJames  Green Jr M.D.   On: 05/13/2021 09:25   ECHOCARDIOGRAM COMPLETE  Result Date: 05/14/2021    ECHOCARDIOGRAM REPORT   Patient Name:   Kimberly Calhoun Date of Exam: 05/14/2021 Medical Rec #:  [id number]      Height:       63.0 in Accession #:    [id number]     Weight:       130.1 lb Date of Birth:  10/13/1940      BSA:          1.611 m Patient Age:    79 years       BP:           130/62 mmHg Patient Gender: F              HR:           92 bpm. Exam Location:  Inpatient Procedure: 2D Echo, Cardiac Doppler and Color Doppler Indications:    I48.91* Unspeicified atrial fibrillation  History:        Patient has prior history of Echocardiogram examinations, most                 recent 03/24/2021. CAD; Risk Factors:Dyslipidemia.  Sonographer:    Eulah PontSarah Pirrotta RDCS Referring Phys: [id number] JENNIFER YATES IMPRESSIONS  1. Left ventricular ejection fraction, by estimation, is 65 to 70%. The left ventricle has normal function. The left ventricle has no regional wall motion  abnormalities. Left ventricular diastolic parameters are consistent with Grade I diastolic dysfunction (impaired relaxation). Elevated left ventricular end-diastolic pressure.  2. Right ventricular systolic function is mildly reduced. The right ventricular size is normal. There is normal pulmonary artery systolic pressure. The estimated right ventricular systolic pressure is 27.4 mmHg.  3. Large pleural effusion in the left lateral region.  4. The mitral valve is normal in structure. No evidence of mitral valve regurgitation. No evidence of mitral stenosis.  5. The aortic valve is tricuspid. Aortic valve regurgitation is not visualized. Mild aortic valve sclerosis is present, with no evidence of aortic valve stenosis.  6. The inferior vena cava is normal in size with greater than 50% respiratory variability, suggesting right atrial pressure of 3 mmHg. FINDINGS  Left Ventricle: Left ventricular ejection fraction, by estimation, is 65 to 70%. The left ventricle has normal function. The left ventricle has no regional wall motion abnormalities. The left ventricular internal cavity size was normal in size. There is  no left ventricular hypertrophy. Left ventricular diastolic parameters are consistent with Grade I diastolic dysfunction (impaired relaxation). Elevated left ventricular end-diastolic pressure. Right Ventricle: The right ventricular size is normal. No increase in right ventricular wall thickness. Right ventricular systolic function is mildly reduced. There is normal pulmonary artery systolic pressure. The tricuspid regurgitant velocity is 2.47 m/s, and with an assumed right atrial pressure of 3 mmHg, the estimated right ventricular systolic pressure is 27.4 mmHg. Left Atrium: Left atrial size was normal in size. Right Atrium: Right  atrial size was normal in size. Pericardium: There is no evidence of pericardial effusion. Presence of pericardial fat pad. Mitral Valve: The mitral valve is normal in structure.  There is mild calcification of the anterior mitral valve leaflet(s). Mild mitral annular calcification. No evidence of mitral valve regurgitation. No evidence of mitral valve stenosis. Tricuspid Valve: The tricuspid valve is normal in structure. Tricuspid valve regurgitation is mild . No evidence of tricuspid stenosis. Aortic Valve: The aortic valve is tricuspid. Aortic valve regurgitation is not visualized. Mild aortic valve sclerosis is present, with no evidence of aortic valve stenosis. Pulmonic Valve: The pulmonic valve was normal in structure. Pulmonic valve regurgitation is not visualized. No evidence of pulmonic stenosis. Aorta: The aortic root is normal in size and structure. Venous: The inferior vena cava is normal in size with greater than 50% respiratory variability, suggesting right atrial pressure of 3 mmHg. IAS/Shunts: No atrial level shunt detected by color flow Doppler. Additional Comments: There is Monterius Rolf large pleural effusion in the left lateral region.  LEFT VENTRICLE PLAX 2D LVIDd:         3.40 cm  Diastology LVIDs:         1.90 cm  LV e' medial:    5.09 cm/s LV PW:         0.90 cm  LV E/e' medial:  18.1 LV IVS:        1.00 cm  LV e' lateral:   6.73 cm/s LVOT diam:     1.90 cm  LV E/e' lateral: 13.7 LV SV:         77 LV SV Index:   48 LVOT Area:     2.84 cm  RIGHT VENTRICLE RV S prime:     12.20 cm/s TAPSE (M-mode): 1.6 cm LEFT ATRIUM             Index       RIGHT ATRIUM           Index LA diam:        2.00 cm 1.24 cm/m  RA Area:     13.00 cm LA Vol (A2C):   31.4 ml 19.50 ml/m RA Volume:   30.90 ml  19.19 ml/m LA Vol (A4C):   34.6 ml 21.48 ml/m LA Biplane Vol: 35.0 ml 21.73 ml/m  AORTIC VALVE LVOT Vmax:   155.00 cm/s LVOT Vmean:  105.000 cm/s LVOT VTI:    0.271 m  AORTA Ao Root diam: 3.00 cm Ao Asc diam:  2.90 cm MITRAL VALVE                TRICUSPID VALVE MV Area (PHT): 3.77 cm     TR Peak grad:   24.4 mmHg MV Decel Time: 201 msec     TR Vmax:        247.00 cm/s MV E velocity: 92.00 cm/s MV  Dodie Parisi velocity: 105.00 cm/s  SHUNTS MV E/Alondra Vandeven ratio:  0.88         Systemic VTI:  0.27 m                             Systemic Diam: 1.90 cm Armanda Magic MD Electronically signed by Armanda Magic MD Signature Date/Time: 05/14/2021/10:33:23 AM    Final    IR THORACENTESIS ASP PLEURAL SPACE W/IMG GUIDE  Result Date: 04/17/2021 INDICATION: Patient with Leidy Massar history of Sohail Capraro recent CABG presents today with Royalty Domagala left pleural effusion. Interventional radiology asked to perform Seanpaul Preece  therapeutic thoracentesis. EXAM: ULTRASOUND GUIDED THORACENTESIS MEDICATIONS: 1% lidocaine 15 mL COMPLICATIONS: None immediate. PROCEDURE: An ultrasound guided thoracentesis was thoroughly discussed with the patient and questions answered. The benefits, risks, alternatives and complications were also discussed. The patient understands and wishes to proceed with the procedure. Written consent was obtained. Ultrasound was performed to localize and mark an adequate pocket of fluid in the left chest. The area was then prepped and draped in the normal sterile fashion. 1% Lidocaine was used for local anesthesia. Under ultrasound guidance Barba Solt 6 Fr Safe-T-Centesis catheter was introduced. Thoracentesis was performed. The catheter was removed and Eria Lozoya dressing applied. FINDINGS: Viktoriya Glaspy total of approximately 800 mL of light red fluid was removed. IMPRESSION: Successful ultrasound guided left thoracentesis yielding 800 mL of pleural fluid. Read by: Alwyn Ren, NP Electronically Signed   By: Gilmer Mor D.O.   On: 04/17/2021 11:04    Microbiology: Recent Results (from the past 240 hour(s))  SARS CORONAVIRUS 2 (TAT 6-24 HRS) Nasopharyngeal Nasopharyngeal Swab     Status: None   Collection Time: 05/13/21  7:37 PM   Specimen: Nasopharyngeal Swab  Result Value Ref Range Status   SARS Coronavirus 2 NEGATIVE NEGATIVE Final    Comment: (NOTE) SARS-CoV-2 target nucleic acids are NOT DETECTED.  The SARS-CoV-2 RNA is generally detectable in upper and lower respiratory  specimens during the acute phase of infection. Negative results do not preclude SARS-CoV-2 infection, do not rule out co-infections with other pathogens, and should not be used as the sole basis for treatment or other patient management decisions. Negative results must be combined with clinical observations, patient history, and epidemiological information. The expected result is Negative.  Fact Sheet for Patients: HairSlick.no  Fact Sheet for Healthcare Providers: quierodirigir.com  This test is not yet approved or cleared by the [place] FDA and  has been authorized for detection and/or diagnosis of SARS-CoV-2 by FDA under an Emergency Use Authorization (EUA). This EUA will remain  in effect (meaning this test can be used) for the duration of the COVID-19 declaration under Se ction 564(b)(1) of the Act, 21 U.S.C. section 360bbb-3(b)(1), unless the authorization is terminated or revoked sooner.  Performed at [facility], [address], [place], [place] [address]      Labs: Basic Metabolic Panel: Recent Labs  Lab 05/13/21 0856 05/14/21 0530  NA 136 134*  K 4.0 3.8  CL 105 102  CO2 23 26  GLUCOSE 110* 102*  BUN 13 11  CREATININE 0.87 0.84  CALCIUM 8.9 8.8*  MG 2.2  --    Liver Function Tests: Recent Labs  Lab 05/13/21 0856  AST 20  ALT 22  ALKPHOS 125  BILITOT 0.6  PROT 6.3*  ALBUMIN 3.3*   Recent Labs  Lab 05/13/21 0856  LIPASE 30   No results for input(s): AMMONIA in the last 168 hours. CBC: Recent Labs  Lab 05/13/21 0856 05/14/21 0530  WBC 11.0* 10.0  NEUTROABS 8.8*  --   HGB 12.2 11.3*  HCT 37.8 34.5*  MCV 91.7 88.5  PLT 501* 500*   Cardiac Enzymes: No results for input(s): CKTOTAL, CKMB, CKMBINDEX, TROPONINI in the last 168 hours. BNP: BNP (last 3 results) Recent Labs    03/21/21 1006 05/13/21 0857  BNP 252.7* 377.0*    ProBNP (last 3 results) No results for  input(s): PROBNP in the last 8760 hours.  CBG: No results for input(s): GLUCAP in the last 168 hours.     Signed:  Lacretia Nicks  MD.  [facility] 05/14/2021, 2:26 PM

## 2021-05-14 NOTE — Progress Notes (Signed)
  Echocardiogram 2D Echocardiogram has been performed.  [name] 05/14/2021, 9:20 AM

## 2021-05-14 NOTE — Consult Note (Signed)
Cardiology Consultation:   Patient ID: Kimberly Calhoun MRN: [id number]; DOB: 11/21/1940  Admit date: 05/13/2021 Date of Consult: 05/14/2021  Primary Care Provider: Ileana LaddWong, Francis P, MD [facility] Cardiologist: Lance MussJayadeep Varanasi, MD  [facility] Electrophysiologist:  None   Patient Profile:   Kimberly Calhoun is a 80 y.o. 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   History of Present Illness:   [name] presented with recurrent palpitations on 08/27 AM with associated bandlike chest pressure and shortness of breath.  She is on metoprolol tartrate 37.5 mg p.o. twice daily along with amiodarone 100 mg p.o. daily.  She has not had recurrence postop after discharge home with her AF is done overall fairly well.  She unfortunately has had recurrent left pleural effusion postop requiring thoracentesis.  She just recently had a thoracentesis on 08/22.  Initial ECG with AF/RVR and she was given metoprolol tartrate 5 mg IV x1 with conversion to NSR in the ED.  During my evaluation she was in normal sinus rhythm and generally asymptomatic.  Past Medical History:  Diagnosis Date   Arthritis    CAD (coronary artery disease)    s/p CABG   Chronic kidney disease, stage 3 (HCC)    Dyslipidemia 05/13/2021   Headache    Hypercholesteremia    Hyperlipidemia    Osteopenia    Personal history of colonic polyps    Pleural effusion    recurrent   Past Surgical History:  Procedure Laterality Date   ABDOMINAL HYSTERECTOMY  02/2013   COLONOSCOPY WITH PROPOFOL N/A 12/07/2014   Procedure: COLONOSCOPY WITH PROPOFOL;  Surgeon: Bernette Redbirdobert Buccini, MD;  Location: [facility];  Service: Endoscopy;  Laterality: N/A;  ultraslim   CORONARY ARTERY BYPASS GRAFT N/A 03/24/2021   Procedure: CORONARY ARTERY BYPASS GRAFTING (CABG) TIMES THREE USING LEFT INTERNAL MAMMARY ARTERY AND RIGHT GREATER SAPHENOUS VEIN HARVESTED  ENDOSCOPICALLY;  Surgeon: Linden DolinAtkins, Broadus Z, MD;  Location: [facility] OR;  Service: Open Heart Surgery;  Laterality: N/A;   IR THORACENTESIS ASP PLEURAL SPACE W/IMG GUIDE  04/17/2021   LEFT HEART CATH AND CORONARY ANGIOGRAPHY N/A 03/22/2021   Procedure: LEFT HEART CATH AND CORONARY ANGIOGRAPHY;  Surgeon: Lennette BihariKelly, Thomas A, MD;  Location: [facility] INVASIVE CV LAB;  Service: Cardiovascular;  Laterality: N/A;   TEE WITHOUT CARDIOVERSION N/A 03/24/2021   Procedure: TRANSESOPHAGEAL ECHOCARDIOGRAM (TEE);  Surgeon: Linden DolinAtkins, Broadus Z, MD;  [facility];  Service: Open Heart Surgery;  Laterality: N/A;    Home Medications:  Prior to Admission medications   Medication Sig Start Date End Date Taking? Authorizing Provider  acetaminophen (TYLENOL) 500 MG tablet Take 500 mg by mouth every 6 (six) hours as needed for moderate pain or headache.   Yes [provider]  albuterol (VENTOLIN HFA) 108 (90 Base) MCG/ACT inhaler Inhale 1 puff into the lungs every 6 (six) hours as needed for shortness of breath. 12/05/20  Yes [provider]  amiodarone (PACERONE) 200 MG tablet Take 0.5 tablets (100 mg total) by mouth daily. 04/19/21  Yes Bhagat, Bhavinkumar, PA  apixaban (ELIQUIS) 5 MG TABS tablet Take 1 tablet (5 mg total) by mouth 2 (two) times daily. 03/30/21  Yes Doree FudgeZimmerman, Donielle M, PA-C  aspirin 81 MG EC tablet Take 81 mg by mouth daily. Swallow whole.   Yes [provider]  CALCIUM PO Take 1 tablet by mouth at bedtime as needed (For bones).   Yes [provider]  cetirizine (ZYRTEC) 10 MG tablet Take 10 mg by mouth daily.   Yes [provider]  Cholecalciferol (VITAMIN D PO) Take 1 tablet by mouth every morning.   Yes [provider]  ezetimibe (ZETIA) 10 MG tablet Take 1 tablet (10 mg total) by mouth daily. 03/31/21  Yes Doree Fudge M, PA-C  metoprolol tartrate (LOPRESSOR) 25 MG tablet Take 1.5 tablets (37.5 mg total) by mouth 2 (two) times daily. 03/30/21  Yes Doree Fudge M, PA-C  Multiple Vitamin (MULTIVITAMIN WITH MINERALS) TABS tablet Take 1 tablet by mouth at bedtime. Centrum silver.   Yes [provider]  Propylene Glycol (SYSTANE BALANCE OP) Apply 1 drop to eye daily as needed (dry eyes.).   Yes [provider]  vitamin C (ASCORBIC ACID) 500 MG tablet Take 500 mg by mouth every morning.   Yes [provider]  vitamin E 180 MG (400 UNITS) capsule Take 1 capsule (400 Units total) by mouth every morning. Patient taking differently: Take 400 Units by mouth daily. 04/24/21  Yes Doree Fudge M, PA-C  zinc gluconate 50 MG tablet Take 50 mg by mouth daily.   Yes [provider]    Inpatient Medications: Scheduled Meds:  amiodarone  100 mg Oral Daily   apixaban  5 mg Oral BID   aspirin EC  81 mg Oral Daily   docusate sodium  100 mg Oral BID   loratadine  10 mg Oral Daily   metoprolol tartrate  37.5 mg Oral BID   sodium chloride flush  3 mL Intravenous Q12H   Continuous Infusions:  PRN Meds: acetaminophen **OR** acetaminophen, albuterol, hydrALAZINE, metoprolol tartrate, ondansetron **OR** ondansetron (ZOFRAN) IV, polyethylene glycol  Allergies:    Allergies  Allergen Reactions   Macrobid [Nitrofurantoin] Other (See Comments)    Cold chills, muscle weakness.   Mold Extract [Trichophyton] Other (See Comments)    Allergies.    Statins Other (See Comments)   Other Other (See Comments)    Allergies.   Mold in Cheese - stuffy nose and headache  Chocolate- stuffy nose and headache  Mildew - stuffy nose and headache    Social History:   Social History   Socioeconomic History   Marital status: Widowed    Spouse name: Not on file   Number of children: 1   Years of education: Not on file   Highest education level: Not on file  Occupational History   Occupation: retired  Tobacco Use   Smoking status: Never   Smokeless tobacco: Never  Vaping Use   Vaping Use: Never used  Substance and Sexual  Activity   Alcohol use: No   Drug use: No   Sexual activity: Not on file  Other Topics Concern   Not on file  Social History Narrative   Not on file   Social Determinants of Health   Financial Resource Strain: Not on file  Food Insecurity: Not on file  Transportation Needs: Not on file  Physical Activity: Not on file  Stress: Not on file  Social Connections: Not on file  Intimate Partner Violence: Not on file    Family History:    Family History  Problem Relation Age of Onset   Heart disease Father    Atrial fibrillation Sister    Diabetes Brother    Breast cancer Neg Hx     ROS:  Review of Systems: [y] = yes, [ ]  = no      General: Weight gain [ ] ;  Weight loss [ ] ; Anorexia [ ] ; Fatigue [ ] ; Fever [ ] ; Chills [ ] ; Weakness [ ]    Cardiac: Chest pain/pressure [y]; Resting SOB [y]; Exertional SOB [ ] ; Orthopnea [ ] ; Pedal Edema [ ] ; Palpitations [y]; Syncope [ ] ; Presyncope [ ] ; Paroxysmal nocturnal dyspnea [ ]    Pulmonary: Cough [ ] ; Wheezing [ ] ; Hemoptysis [ ] ; Sputum [ ] ; Snoring [ ]    GI: Vomiting [ ] ; Dysphagia [ ] ; Melena [ ] ; Hematochezia [ ] ; Heartburn [ ] ; Abdominal pain [ ] ; Constipation [ ] ; Diarrhea [ ] ; BRBPR [ ]    GU: Hematuria [ ] ; Dysuria [ ] ; Nocturia [ ]  Vascular: Pain in legs with walking [ ] ; Pain in feet with lying flat [ ] ; Non-healing sores [ ] ; Stroke [ ] ; TIA [ ] ; Slurred speech [ ] ;   Neuro: Headaches [ ] ; Vertigo [ ] ; Seizures [ ] ; Paresthesias [ ] ;Blurred vision [ ] ; Diplopia [ ] ; Vision changes [ ]    Ortho/Skin: Arthritis [ ] ; Joint pain [ ] ; Muscle pain [ ] ; Joint swelling [ ] ; Back Pain [ ] ; Rash [ ]    Psych: Depression [ ] ; Anxiety [ ]    Heme: Bleeding problems [ ] ; Clotting disorders [ ] ; Anemia [ ]    Endocrine: Diabetes [ ] ; Thyroid dysfunction [ ]    Physical Exam/Data:   Vitals:   05/13/21 1815 05/13/21 1930 05/13/21 2034 05/14/21 0442  BP: (!) 145/63 (!) 151/79 (!) 143/74 130/62  Pulse: 96 95 96 86  Resp: 18  16 18   Temp: 98.6 F  (37 C)  98.5 F (36.9 C) 98.9 F (37.2 C)  TempSrc:   Oral Oral  SpO2: 92% 97% 98% 98%  Weight:      Height:        Intake/Output Summary (Last 24 hours) at 05/14/2021 0815 Last data filed at 05/14/2021 0400 Gross per 24 hour  Intake 480 ml  Output 1150 ml  Net -670 ml   Last 3 Weights 05/13/2021 05/13/2021 05/08/2021  Weight (lbs) 130 lb 1.1 oz 131 lb 131 lb  Weight (kg) 59 kg 59.421 kg 59.421 kg     Body mass index is 23.04 kg/m.  General:  Well nourished, well developed, in no acute distress HEENT: normal Lymph: no adenopathy Neck: no JVD Endocrine:  No thryomegaly Vascular: No carotid bruits; FA pulses 2+ bilaterally without bruits  Cardiac:  normal S1, S2; RRR; no murmur  Lungs: breath sounds throughout R side but significantly diminished on L side to mid lung, no wheezes, rales or rhonchi  Abd: soft, nontender, no hepatomegaly  Ext: no edema Musculoskeletal:  No deformities, BUE and BLE strength normal and equal Skin: warm and dry  Neuro:  CNs 2-12 intact, no focal abnormalities noted Psych:  Normal affect   EKG:  The EKG (08/27, 18:55)was personally reviewed and demonstrates: AF/RVR Telemetry:  Telemetry was personally reviewed and demonstrates: NSR with no AF recurrence  Relevant CV Studies:  TTE Result date: 03/22/21  1. LVOT gradient 43 mmHg with Valsalva. Left ventricular ejection  fraction, by estimation, is 60 to 65%. The left ventricle has normal  function. The left ventricle demonstrates regional wall motion  abnormalities (see scoring diagram/findings for  description). There is moderate asymmetric left ventricular hypertrophy of  the basal-septal segment. Left ventricular diastolic parameters are  indeterminate. There is hypokinesis of the left ventricular, basal-mid  anteroseptal wall and inferoseptal wall.   2. Right ventricular systolic function is normal. The right ventricular  size  is normal.   3. The mitral valve is degenerative. Mild mitral  valve regurgitation. No  evidence of mitral stenosis.   4. The aortic valve was not well visualized. Aortic valve regurgitation  is not visualized.   5. The inferior vena cava is normal in size with greater than 50%  respiratory variability, suggesting right atrial pressure of 3 mmHg.   Laboratory Data:  High Sensitivity Troponin:   Recent Labs  Lab 05/13/21 0856 05/13/21 1057 05/13/21 1844 05/13/21 2009  TROPONINIHS Chemistry Recent Labs  Lab 05/13/21 0856 05/14/21 0530  NA 136 134*  K 4.0 3.8  CL 105 102  CO2 23 26  GLUCOSE 110* 102*  BUN 13 11  CREATININE 0.87 0.84  CALCIUM 8.9 8.8*  GFRNONAA >60 >60  ANIONGAP 8 6    Recent Labs  Lab 05/13/21 0856  PROT 6.3*  ALBUMIN 3.3*  AST 20  ALT 22  ALKPHOS 125  BILITOT 0.6   Hematology Recent Labs  Lab 05/13/21 0856 05/14/21 0530  WBC 11.0* 10.0  RBC 4.12 3.90  HGB 12.2 11.3*  HCT 37.8 34.5*  MCV 91.7 88.5  MCH 29.6 29.0  MCHC 32.3 32.8  RDW 14.6 14.6  PLT 501* 500*   BNP Recent Labs  Lab 05/13/21 0857  BNP 377.0*    DDimer No results for input(s): DDIMER in the last 168 hours.  Radiology/Studies:  DG Chest Portable 1 View  Result Date: 05/13/2021 CLINICAL DATA:  Chest pain. EXAM: PORTABLE CHEST 1 VIEW COMPARISON:  May 08, 2021. FINDINGS: The heart size and mediastinal contours are within normal limits. Sternotomy wires are noted. Right lung is clear. Small left pleural effusion is noted with associated left basilar atelectasis or infiltrate. The visualized skeletal structures are unremarkable. IMPRESSION: Small left pleural effusion with associated left basilar atelectasis or infiltrate. Electronically Signed   By: Lupita Raider M.D.   On: 05/13/2021 09:25   { Assessment and Plan:   AF/RVR Ms. Sawin presents with SOB, chest pressure, and palpitations consistent with AF/RVR recurrence.  She is currently on metoprolol tartrate 37.5 mg p.o. twice daily along with amiodarone 100 mg  p.o. daily.  She has done well on this regimen up until this presentation.  She responded to metoprolol tartrate 5 mg IV x1 dose and has spontaneously cardioverted prior to admission from the ED.  She is otherwise asymptomatic during my evaluation.  She is maintained on apixaban 5 mg p.o. twice daily and is right on the cutoff of reduced dose Eliquis `(age 23, wt 59 kg). ` Given that her CHA2DS2-VASc is 5 we can maintain on the 5 mg dose for now and reassess in clinic depending on what her weight trend is.  Otherwise would consider increasing her metoprolol to tartrate 50 mg p.o. twice daily and continuing her amiodarone at 100 mg p.o. daily at discharge.  Repeat TTE is being ordered today to assess LV function post CABG.  Prior to her surgery her EF was normal.  Troponins ruled out today.  Please page the primary cardiology service if her EF is newly reduced as this could be a sign of tachycardia induced cardiomyopathy although on exam she does not have signs or symptoms of overt heart failure.  She is requiring intermittent oxygen and has continuous dyspnea which is likely exacerbated by pleural effusion however she had some dyspnea preceding her CABG as well. - increase metop tartrate to 50 mg PO bid at discharge -  continue amio at 100 mg PO daily - continue apixaban at 5 mg PO bid   [facility] will sign off.   Medication Recommendations:  as above Other recommendations (labs, testing, etc): TTE ordered Follow up as an outpatient: routine f/u   For questions or updates, please contact [facility] Please consult [email or web address] for contact info under   Signed, Linton Rump, MD  05/14/2021 8:15 AM

## 2021-05-14 NOTE — Evaluation (Signed)
Occupational Therapy Evaluation Patient Details Name: Kimberly Calhoun MRN: [id number] DOB: Nov 09, 1940 Today's Date: 05/14/2021    History of Present Illness Pt is a 80 y/o female presenting on 8/27 with chest pain and palpitations. Found in afib with RVR. PMH includes: stage 3 CKD, CAD s/p CABG 03/2021, afib, recurrent pleural effusion, osteopenia.   Clinical Impression   PTA patient independent. Admitted for above and presenting at baseline independent level for ADLs, functional mobility in room.  She has good awareness of sternal precautions and activity progression.  She is on RA during session with VSS, no pain.  Based on performance today, no further OT needs have been identified and OT will sign off.  Pt agreeable with recommendations.     Follow Up Recommendations  No OT follow up    Equipment Recommendations  None recommended by OT    Recommendations for Other Services       Precautions / Restrictions Precautions Precautions: Sternal Precaution Booklet Issued: No Precaution Comments: reviewed with pt, s/p CABG 03/2021 Restrictions Weight Bearing Restrictions: No      Mobility Bed Mobility Overal bed mobility: Independent                  Transfers Overall transfer level: Independent                    Balance Overall balance assessment: No apparent balance deficits (not formally assessed)                                         ADL either performed or assessed with clinical judgement   ADL Overall ADL's : Modified independent                                       General ADL Comments: no assist required for mobility to restroom, toileting, LB dressing; demonstrates good awareness of sternal precautions and activity tolerance     Vision         Perception     Praxis      Pertinent Vitals/Pain Pain Assessment: No/denies pain     Hand Dominance Right   Extremity/Trunk Assessment Upper Extremity  Assessment Upper Extremity Assessment: Overall WFL for tasks assessed (within sternal limitations)   Lower Extremity Assessment Lower Extremity Assessment: Defer to PT evaluation   Cervical / Trunk Assessment Cervical / Trunk Assessment: Normal   Communication Communication Communication: No difficulties   Cognition Arousal/Alertness: Awake/alert Behavior During Therapy: WFL for tasks assessed/performed Overall Cognitive Status: Within Functional Limits for tasks assessed                                     General Comments  VSS on RA    Exercises     Shoulder Instructions      Home Living Family/patient expects to be discharged to:: Private residence Living Arrangements: Alone Available Help at Discharge: Family;Available PRN/intermittently Type of Home: House Home Access: Stairs to enter [employer] of Steps: 4 Entrance Stairs-Rails: Right;Left Home Layout: Two level;Bed/bath upstairs Alternate Level Stairs-Number of Steps: flight   Bathroom Shower/Tub: Producer, television/film/video: Handicapped height     Home Equipment: Shower seat;Grab bars - tub/shower;Cane - single point  Prior Functioning/Environment Level of Independence: Independent        Comments: has a housekeeper, pt indepedendent and driving (cooking)        OT Problem List:        OT Treatment/Interventions:      OT Goals(Current goals can be found in the care plan section) Acute Rehab OT Goals Patient Stated Goal: to get back home and get back to walking OT Goal Formulation: With patient  OT Frequency:     Barriers to D/C:            Co-evaluation              AM-PAC OT "6 Clicks" Daily Activity     Outcome Measure Help from another person eating meals?: None Help from another person taking care of personal grooming?: None Help from another person toileting, which includes using toliet, bedpan, or urinal?: None Help from another  person bathing (including washing, rinsing, drying)?: None Help from another person to put on and taking off regular upper body clothing?: None Help from another person to put on and taking off regular lower body clothing?: None 6 Click Score: 24   End of Session Nurse Communication: Mobility status  Activity Tolerance: Patient tolerated treatment well Patient left: in bed;with call bell/phone within reach  OT Visit Diagnosis: Muscle weakness (generalized) (M62.81)                Time: 6629-4765 OT Time Calculation (min): 20 min Charges:  OT General Charges $OT Visit: 1 Visit OT Evaluation $OT Eval Low Complexity: 1 Low  Barry Brunner, OT Acute Rehabilitation Services Pager [phone] Office [phone]   Chancy Milroy 05/14/2021, 9:21 AM

## 2021-05-14 NOTE — Progress Notes (Signed)
PT Cancellation Note  Patient Details Name: Kimberly Calhoun MRN: [id number] DOB: March 31, 1941   Cancelled Treatment:    Reason Eval/Treat Not Completed: PT screened, no needs identified, will sign off. Pt observed independently ambulating in hallway. Pt reports no concerns about mobility and declines PT evaluation at this time. Acute PT signing off.   Arlyss Gandy 05/14/2021, 1:57 PM

## 2021-05-14 NOTE — Progress Notes (Signed)
Brief cardiology update:   Patient presenting with afib RVR and has now converted, remains in sinus rhythm. Echo results as noted:  1. Left ventricular ejection fraction, by estimation, is 65 to 70%. The  left ventricle has normal function. The left ventricle has no regional  wall motion abnormalities. Left ventricular diastolic parameters are  consistent with Grade I diastolic  dysfunction (impaired relaxation). Elevated left ventricular end-diastolic  pressure.   2. Right ventricular systolic function is mildly reduced. The right  ventricular size is normal. There is normal pulmonary artery systolic  pressure. The estimated right ventricular systolic pressure is 27.4 mmHg.   3. Large pleural effusion in the left lateral region.   4. The mitral valve is normal in structure. No evidence of mitral valve  regurgitation. No evidence of mitral stenosis.   5. The aortic valve is tricuspid. Aortic valve regurgitation is not  visualized. Mild aortic valve sclerosis is present, with no evidence of  aortic valve stenosis.   6. The inferior vena cava is normal in size with greater than 50%  respiratory variability, suggesting right atrial pressure of 3 mmHg.   As noted in consult note, she is stable for discharge. She has an appt scheduled for 05/31/21 with Dr. Eldridge Dace.  Jodelle Red, MD, PhD, [facility] [place]  [facility]  [place]  [facility] & V[facility] at [facility] at [facility] [address], [address] [place], [place] [address] [phone]

## 2021-05-15 ENCOUNTER — Other Ambulatory Visit: Payer: Self-pay | Admitting: Internal Medicine

## 2021-05-15 ENCOUNTER — Telehealth: Payer: Self-pay | Admitting: Pulmonary Disease

## 2021-05-15 DIAGNOSIS — J90 Pleural effusion, not elsewhere classified: Secondary | ICD-10-CM

## 2021-05-15 NOTE — Telephone Encounter (Signed)
Called spoke with patient she states she was in the hospital they wanted her to stay overnight and be drained today but she didn't want to stay overnight. They told her to call Dr. Marjory Lies office to schedule getting drained. Patient still have fluid on her lung   Will send to Dr. Tonia Brooms for further recommendations for patient.

## 2021-05-15 NOTE — Telephone Encounter (Signed)
Per Dr. Myrlene Broker phone note   You can let her know that her fluid looks normal and appears like it should post cardiac surgery  Thanks   BLI   Patient was concerned because people at hospital was showing her there was still fluid present. I explained to her Dr. Tonia Brooms is aware but wants a more detailed explanations why if she has fluid she doesn't need it drained. They were showing her off the Echocardiogram.  Patient is having trouble breathing, checking her oxygen levels are the lowest it has been is 94% usually above 95% and is concerned.  Sending back to Dr. Tonia Brooms for further recommendations.

## 2021-05-15 NOTE — Telephone Encounter (Signed)
Pt informed of response per Dr Tonia Brooms  She is scheduled for rov with cxr

## 2021-05-16 NOTE — Telephone Encounter (Signed)
Call made to patient, confirmed DOB. She reports she was recently in the hospital for A-fib and hospital told her she needed a repeat thoracentesis but she states she spoke with BI and they are going to address at her next appt. She was also aware of her thoro results. No concerns voiced. Confirmed appt date and time.   Nothing further needed at this time.

## 2021-05-18 ENCOUNTER — Ambulatory Visit: Payer: Medicare Other | Admitting: Internal Medicine

## 2021-05-18 ENCOUNTER — Encounter: Payer: Self-pay | Admitting: Internal Medicine

## 2021-05-18 ENCOUNTER — Other Ambulatory Visit: Payer: Self-pay

## 2021-05-18 ENCOUNTER — Other Ambulatory Visit: Payer: Self-pay | Admitting: Internal Medicine

## 2021-05-18 VITALS — BP 118/58 | HR 80 | Temp 98.2°F | Resp 20 | Ht 61.5 in | Wt 132.0 lb

## 2021-05-18 DIAGNOSIS — R0602 Shortness of breath: Secondary | ICD-10-CM | POA: Diagnosis not present

## 2021-05-18 DIAGNOSIS — J310 Chronic rhinitis: Secondary | ICD-10-CM

## 2021-05-18 DIAGNOSIS — J300 Vasomotor rhinitis: Secondary | ICD-10-CM

## 2021-05-18 MED ORDER — FLUTICASONE PROPIONATE 50 MCG/ACT NA SUSP: NASAL | 1 refills | Status: DC

## 2021-05-18 NOTE — Progress Notes (Signed)
NEW PATIENT Date of Service/Encounter:  05/18/21 Referring provider: Ileana Ladd, MD Primary care provider: Ileana Ladd, MD  Subjective:  Kimberly Calhoun is a 80 y.o. female with a PMHx of dyslipidemia, CAD, paroxysmal atrial fibrillation, CKD, HLD, NSTEMI s/p CABG x 3 presenting today for evaluation of shortness of breath and clear runny nose. History obtained from: chart review and patient.  She reports having "either allergies or something else going on" that is causing her to have breathing problems described as shortness of breath, present at all times of day and worse with activity.  She had CABG x 3 in July, and this did not improve her breathing.   When she was younger (in her 36s) had bad allergies to chocolate, cheese (mold in cheese), mold-would causes bad headaches, runny nose and would feel sick for several days.  She did allergy shots during this time for 2 years, and her symptoms resolved completely.    This year, her breathing issues started a little before Spring.  She has noted some clear runny nose but this did not coincide with breathing issues and is more recent.   She is concerned about possible cat allergy, but cat is not new to the home.  Never had a history of asthma.  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  She has used it in the past, but is not sure that it was helpful.  She hasn't used since her heart surgery on July 8th because it makes her heart speed up and have palpitations. When she is walking, will feel out breath.  It isn't getting worse, but isn't getting better.  She has had thoracocentesis on several occasions following CABG, and this also did not help breathing.  CABG did not help breathing either.  She is being followed by Pulmonology who have been "draining her lungs" since her CABG as pulmonary edema has been an ongoing issue.  She has not had pulmonary function testing.   Recent  Hospital Admission on 05/13/21:  Persistent shortness of breath and left pulmonary effusion requiring thoracocentesis following recent CABG with Afib requiring IV beta blocker reversal recently discharged on 05/13/21.  Past Medical History: Past Medical History:  Diagnosis Date   Arthritis    Asthma    CAD (coronary artery disease)    s/p CABG   Chronic kidney disease, stage 3 (HCC)    Dyslipidemia 05/13/2021   Headache    Hypercholesteremia    Hyperlipidemia    Osteopenia    Personal history of colonic polyps    Pleural effusion    recurrent   Medication List:  Current Outpatient Medications  Medication Sig Dispense Refill   acetaminophen (TYLENOL) 500 MG tablet Take 500 mg by mouth every 6 (six) hours as needed for moderate pain or headache.     albuterol (VENTOLIN HFA) 108 (90 Base) MCG/ACT inhaler Inhale 1 puff into the lungs every 6 (six) hours as needed for shortness of breath.     amiodarone (PACERONE) 200 MG tablet Take 0.5 tablets (100 mg total) by mouth daily. 15 tablet 6   apixaban (ELIQUIS) 5 MG TABS tablet Take 1 tablet (5 mg total) by mouth 2 (two) times daily. 60 tablet 1   aspirin 81 MG EC tablet Take 81 mg by mouth daily. Swallow whole.     CALCIUM PO Take 1 tablet by mouth at bedtime as needed (For bones).  cetirizine (ZYRTEC) 10 MG tablet Take 10 mg by mouth daily.     Cholecalciferol (VITAMIN D PO) Take 1 tablet by mouth every morning.     ezetimibe (ZETIA) 10 MG tablet Take 1 tablet (10 mg total) by mouth daily. 30 tablet 1   fluticasone (FLONASE) 50 MCG/ACT nasal spray 2 sprays in each nostril daily for clear runny nose. 16 g 1   metoprolol tartrate (LOPRESSOR) 25 MG tablet Take 2 tablets (50 mg total) by mouth 2 (two) times daily. 120 tablet 0   Multiple Vitamin (MULTIVITAMIN WITH MINERALS) TABS tablet Take 1 tablet by mouth at bedtime. Centrum silver.     Propylene Glycol (SYSTANE BALANCE OP) Apply 1 drop to eye daily as needed (dry eyes.).     vitamin C  (ASCORBIC ACID) 500 MG tablet Take 500 mg by mouth every morning.     vitamin E 180 MG (400 UNITS) capsule Take 1 capsule (400 Units total) by mouth every morning. (Patient taking differently: Take 400 Units by mouth daily.)     zinc gluconate 50 MG tablet Take 50 mg by mouth daily.     No current facility-administered medications for this visit.   Known Allergies:  Allergies  Allergen Reactions   Macrobid [Nitrofurantoin] Other (See Comments)    Cold chills, muscle weakness.   Mold Extract [Trichophyton] Other (See Comments)    Allergies.    Statins Other (See Comments)   Other Other (See Comments)    Allergies.   Mold in Cheese - stuffy nose and headache  Chocolate- stuffy nose and headache  Mildew - stuffy nose and headache   Past Surgical History: Past Surgical History:  Procedure Laterality Date   ABDOMINAL HYSTERECTOMY  02/2013   COLONOSCOPY WITH PROPOFOL N/A 12/07/2014   Procedure: COLONOSCOPY WITH PROPOFOL;  Surgeon: Bernette Redbird, MD;  Location: [facility];  Service: Endoscopy;  Laterality: N/A;  ultraslim   CORONARY ARTERY BYPASS GRAFT N/A 03/24/2021   Procedure: CORONARY ARTERY BYPASS GRAFTING (CABG) TIMES THREE USING LEFT INTERNAL MAMMARY ARTERY AND RIGHT GREATER SAPHENOUS VEIN HARVESTED ENDOSCOPICALLY;  Surgeon: Linden Dolin, MD;  Location: [facility] OR;  Service: Open Heart Surgery;  Laterality: N/A;   IR THORACENTESIS ASP PLEURAL SPACE W/IMG GUIDE  04/17/2021   LEFT HEART CATH AND CORONARY ANGIOGRAPHY N/A 03/22/2021   Procedure: LEFT HEART CATH AND CORONARY ANGIOGRAPHY;  Surgeon: Lennette Bihari, MD;  Location: [facility];  Service: Cardiovascular;  Laterality: N/A;   TEE WITHOUT CARDIOVERSION N/A 03/24/2021   Procedure: TRANSESOPHAGEAL ECHOCARDIOGRAM (TEE);  Surgeon: Linden Dolin, MD;  Location[facility];  Service: Open Heart Surgery;  Laterality: N/A;   Family History: Family History  Problem Relation Age of Onset   Heart disease Father    Atrial fibrillation  Sister    Rheum arthritis Sister    Diabetes Brother    Breast cancer Neg Hx    Social History: Kimberly Calhoun lives in a home built 38 years ago, carpet in bedroom, gas heating, central AC, pet cat, no visible roaches, no cockroach protection, she is retired and has no smoke exposure.   ROS:  All other systems negative except as noted per HPI.  Objective:  Blood pressure (!) 118/58, pulse 80, temperature 98.2 F (36.8 C), temperature source Temporal, resp. rate 20, height 5' 1.5" (1.562 m), weight 132 lb (59.9 kg), SpO2 96 %. Body mass index is 24.54 kg/m. Physical Exam:  General Appearance:  Alert, cooperative, no distress, appears stated age  Head:  Normocephalic, without obvious abnormality, atraumatic  Eyes:  Conjunctiva clear, EOM's intact  Nose: Nares normal, mucosa normal, no rhinorrhea  Throat: Lips, tongue normal; teeth and gums normal, normal posterior oropharynx  Neck: Supple, symmetrical  Lungs:   Clear to air bilaterally, no wheezing, respirations unlabored, no coughing  Heart:  Regular rate and rhythm, no murmur, appears well perfused  Extremities: No edema  Skin: Skin color, texture, turgor normal, no rashes or lesions on visualized portions of skin  Neurologic: No gross deficits   Diagnostics: Deferred due to recent cardiac surgery.  Skin Testing: Environmental allergy panel Results discussed with patient/family.  Airborne Adult Perc - 05/18/21 1425     Time Antigen Placed 1425    Allergen Manufacturer Waynette ButteryGreer    Location Back    Number of Test 59    1. Control-Buffer 50% Glycerol Negative    2. Control-Histamine 1 mg/ml 3+    3. Albumin saline Negative    4. Bahia Negative    5. [place] Negative    6. Johnson Negative    7. Kentucky Blue Negative    8. Meadow Fescue Negative    9. Perennial Rye Negative    10. Sweet Vernal Negative    11. Timothy Negative    12. Cocklebur Negative    13. Burweed Marshelder Negative    14. Ragweed, short Negative    15.  Ragweed, Giant Negative    16. Plantain,  English Negative    17. Lamb's Quarters Negative    18. Sheep Sorrell Negative    19. Rough Pigweed Negative    20. Marsh Elder, Rough Negative    21. Mugwort, Common Negative    22. Ash mix Negative    23. Birch mix Negative    24. Beech American Negative    25. Box, Elder Negative    26. Cedar, red Negative    27. Cottonwood, [place]astern Negative    28. Elm mix Negative    29. Hickory Negative    30. Maple mix Negative    31. Oak, [place] mix Negative    32. Pecan Pollen Negative    33. Pine mix Negative    34. Sycamore Eastern Negative    35. Walnut, Black Pollen Negative    36. Alternaria alternata Negative    37. Cladosporium Herbarum Negative    38. Aspergillus mix Negative    39. Penicillium mix Negative    40. Bipolaris sorokiniana (Helminthosporium) Negative    41. Drechslera spicifera (Curvularia) Negative    42. Mucor plumbeus Negative    43. Fusarium moniliforme Negative    44. Aureobasidium pullulans (pullulara) Negative    45. Rhizopus oryzae Negative    46. Botrytis cinera Negative    47. Epicoccum nigrum Negative    48. Phoma betae Negative    49. Candida Albicans Negative    50. Trichophyton mentagrophytes Negative    51. Mite, D Farinae  5,000 AU/ml Negative    52. Mite, D Pteronyssinus  5,000 AU/ml Negative    53. Cat Hair 10,000 BAU/ml Negative    54.  Dog Epithelia Negative    55. Mixed Feathers Negative    56. Horse Epithelia Negative    57. Cockroach, German Negative    58. Mouse Negative    59. Tobacco Leaf Negative             Allergy testing results were read and interpreted by myself, documented by clinical staff.  Assessment:  Dyspnea Given her recent bypass surgery  and her reported dyspnea, we opted not to do spirometry in office today.   She would benefit from PFTs in a monitored setting.  She is followed by Pulmonology but this problem has not been specifically addressed.  I encouraged her to  follow-up with them specifically regarding her breathing given her complicated history.  Do not suspect allergies are provoking the symptoms.  Asthma certainly is a possibility, but will need to be further explored with pulmonary function testing.  Trial of Xopenex, but she prefers to wait pulmonary function testing which is very reasonable as there is likely multiple etiologies contributing to her shortness of breath.  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  Shortness of breath:  - you have many reasons why you may feel short of breath including heart causes, the fluid on your lungs, deconditioning, asthma among many others - do not suspect this is secondary to environmental allergies or medication allergies - because of your complicated cardiac history and your new pulmonary effusions, would be best to get full pulmonary function tests to better evaluate your lung capacities - your pulmonologist can order these, or if you would like, we can place the order  Follow-up as needed.  This note in its entirety was forwarded to the Provider who requested this consultation.  Thank you for your kind referral. I appreciate the opportunity to take part in Isaac's care. Please do not hesitate to contact me with questions.  Sincerely,  Tonny Bollman, MD [facility]

## 2021-05-18 NOTE — Patient Instructions (Signed)
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  Shortness of breath:  - you have many reasons why you may feel short of breath including heart causes, the fluid on your lungs, deconditioning, asthma among many others - do not suspect this is secondary to environmental allergies or medication allergies - because of your complicated cardiac history and your new pulmonary effusions, would be best to get full pulmonary function tests to better evaluate your lung capacities - your pulmonologist can order these, or if you would like, we can place the order

## 2021-05-29 ENCOUNTER — Encounter: Payer: Self-pay | Admitting: Adult Health

## 2021-05-29 ENCOUNTER — Ambulatory Visit (INDEPENDENT_AMBULATORY_CARE_PROVIDER_SITE_OTHER): Payer: Medicare Other

## 2021-05-29 ENCOUNTER — Ambulatory Visit: Payer: Medicare Other | Admitting: Adult Health

## 2021-05-29 ENCOUNTER — Other Ambulatory Visit: Payer: Self-pay

## 2021-05-29 VITALS — BP 144/70 | HR 83 | Temp 97.9°F | Ht 63.5 in | Wt 133.0 lb

## 2021-05-29 DIAGNOSIS — J90 Pleural effusion, not elsewhere classified: Secondary | ICD-10-CM

## 2021-05-29 NOTE — Patient Instructions (Addendum)
Chest xray today.  Activity as tolerated.  Follow up with Dr. Tonia Brooms in 4-6 weeks and As needed   Please contact office for sooner follow up if symptoms do not improve or worsen or seek emergency care

## 2021-05-29 NOTE — Progress Notes (Signed)
  ID: Kimberly Calhoun, female    DOB: June 01, 1941, 80 y.o.   MRN: [id number]  Chief Complaint  Patient presents with   Follow-up    Referring provider: Ileana Ladd, MD  HPI: 80 year old female never smoker seen for pulmonary consult May 08, 2021 for recurrent left-sided pleural effusion after recent CABG Medical history significant for coronary disease status post CABG, chronic kidney disease, hyperlipidemia, A. fib on anticoagulation therapy and amiodarone/eliquis   TEST/EVENTS :  2D echo August 2022 EF 65 to 70%, grade 1 diastolic dysfunction, right ventricular systolic function mildly reduced.,  Normal pulmonary artery systolic pressure.  05/29/2021 Follow up : Pleural Effusion  Patient returns for a 3-week follow-up.  Patient was seen last visit for a pulmonary consult for recurrent left-sided pleural effusion after CABG in July 2022.  She underwent a repeat thoracentesis in the office.  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  Cytology showed reactive mesothelial cells and numerous inflammatory cells. Neg malignant cells . Pleural  fluid analysis showed TNC 4,300, elevated Lymphs at 61% , and Eosinophils 24%.   since last visit patient was hospitalized for A. fib with RVR.  She was treated with IV beta-blockers with improvement in conversion to sinus rhythm.  2D echo showed mildly reduced right ventricular systolic function.  Did require some diuresis. She is feeling better . No increased edema.   Walks a lot -1 mile a day . Flat surface does better. Gets winded with prolonged walking and stairs.  No cough  or orthopnea. No edema..   Allergies  Allergen Reactions   Macrobid [Nitrofurantoin] Other (See Comments)    Cold chills, muscle weakness.   Mold Extract [Trichophyton] Other (See Comments)    Allergies.    Statins Other (See Comments)   Other Other (See Comments)    Allergies.   Mold in Cheese - stuffy nose and headache  Chocolate- stuffy nose  and headache  Mildew - stuffy nose and headache    Immunization History  Administered Date(s) Administered   Janssen (J&J) SARS-COV-2 Vaccination 12/21/2019   Moderna Sars-Covid-2 Vaccination 09/16/2020    Past Medical History:  Diagnosis Date   Arthritis    Asthma    CAD (coronary artery disease)    s/p CABG   Chronic kidney disease, stage 3 (HCC)    Dyslipidemia 05/13/2021   Headache    Hypercholesteremia    Hyperlipidemia    Osteopenia    Personal history of colonic polyps    Pleural effusion    recurrent    Tobacco History: Social History   Tobacco Use  Smoking Status Never  Smokeless Tobacco Never   Counseling given: Not Answered   Outpatient Medications Prior to Visit  Medication Sig Dispense Refill   acetaminophen (TYLENOL) 500 MG tablet Take 500 mg by mouth every 6 (six) hours as needed for moderate pain or headache.     albuterol (VENTOLIN HFA) 108 (90 Base) MCG/ACT inhaler Inhale 1 puff into the lungs every 6 (six) hours as needed for shortness of breath.     amiodarone (PACERONE) 200 MG tablet Take 0.5 tablets (100 mg total) by mouth daily. 15 tablet 6   apixaban (ELIQUIS) 5 MG TABS tablet Take 1 tablet (5 mg total) by mouth 2 (two) times daily. 60 tablet 1   aspirin 81 MG EC tablet Take 81 mg by mouth daily. Swallow whole.     CALCIUM PO Take 1 tablet by mouth at bedtime as needed (For bones).  cetirizine (ZYRTEC) 10 MG tablet Take 10 mg by mouth daily.     Cholecalciferol (VITAMIN D PO) Take 1 tablet by mouth every morning.     ezetimibe (ZETIA) 10 MG tablet Take 1 tablet (10 mg total) by mouth daily. 30 tablet 1   fluticasone (FLONASE) 50 MCG/ACT nasal spray USE 2 SPRAYS IN EACH NOSTRIL DAILY FOR CLEAR RUNNY NOSE 48 g 4   metoprolol tartrate (LOPRESSOR) 25 MG tablet Take 2 tablets (50 mg total) by mouth 2 (two) times daily. 120 tablet 0   Multiple Vitamin (MULTIVITAMIN WITH MINERALS) TABS tablet Take 1 tablet by mouth at bedtime. Centrum silver.      Propylene Glycol (SYSTANE BALANCE OP) Apply 1 drop to eye daily as needed (dry eyes.).     vitamin C (ASCORBIC ACID) 500 MG tablet Take 500 mg by mouth every morning.     vitamin E 180 MG (400 UNITS) capsule Take 1 capsule (400 Units total) by mouth every morning. (Patient taking differently: Take 400 Units by mouth daily.)     zinc gluconate 50 MG tablet Take 50 mg by mouth daily.     No facility-administered medications prior to visit.     Review of Systems:   Constitutional:   No  weight loss, night sweats,  Fevers, chills, fatigue, or  lassitude.  HEENT:   No headaches,  Difficulty swallowing,  Tooth/dental problems, or  Sore throat,                No sneezing, itching, ear ache, nasal congestion, post nasal drip,   CV:  No chest pain,  Orthopnea, PND, swelling in lower extremities, anasarca, dizziness, palpitations, syncope.   GI  No heartburn, indigestion, abdominal pain, nausea, vomiting, diarrhea, change in bowel habits, loss of appetite, bloody stools.   Resp: No shortness of breath with exertion or at rest.  No excess mucus, no productive cough,  No non-productive cough,  No coughing up of blood.  No change in color of mucus.  No wheezing.  No chest wall deformity  Skin: no rash or lesions.  GU: no dysuria, change in color of urine, no urgency or frequency.  No flank pain, no hematuria   MS:  No joint pain or swelling.  No decreased range of motion.  No back pain.    Physical Exam  BP (!) 144/70 (BP Location: Left Arm, Patient Position: Sitting, Cuff Size: Normal)   Pulse 83   Temp 97.9 F (36.6 C) (Oral)   Ht 5' 3.5" (1.613 m)   Wt 133 lb (60.3 kg)   SpO2 92%   BMI 23.19 kg/m   GEN: A/Ox3; pleasant , NAD, well nourished    HEENT:  [place]/AT,  EACs-clear, TMs-wnl, NOSE-clear, THROAT-clear, no lesions, no postnasal drip or exudate noted.   NECK:  Supple w/ fair ROM; no JVD; normal carotid impulses w/o bruits; no thyromegaly or nodules palpated; no lymphadenopathy.     RESP  Clear  P & A; w/o, wheezes/ rales/ or rhonchi. no accessory muscle use, no dullness to percussion  CARD:  RRR, no m/r/g, no peripheral edema, pulses intact, no cyanosis or clubbing.  GI:   Soft & nt; nml bowel sounds; no organomegaly or masses detected.   Musco: Warm bil, no deformities or joint swelling noted.   Neuro: alert, no focal deficits noted.    Skin: Warm, no lesions or rashes    Lab Results:  CBC    Component Value Date/Time   WBC 10.0 05/14/2021  0530   RBC 3.90 05/14/2021 0530   HGB 11.3 (L) 05/14/2021 0530   HCT 34.5 (L) 05/14/2021 0530   PLT 500 (H) 05/14/2021 0530   MCV 88.5 05/14/2021 0530   MCH 29.0 05/14/2021 0530   MCHC 32.8 05/14/2021 0530   RDW 14.6 05/14/2021 0530   LYMPHSABS 1.1 05/13/2021 0856   MONOABS 0.9 05/13/2021 0856   EOSABS 0.1 05/13/2021 0856   BASOSABS 0.1 05/13/2021 0856    BMET    Component Value Date/Time   NA 134 (L) 05/14/2021 0530   K 3.8 05/14/2021 0530   CL 102 05/14/2021 0530   CO2 26 05/14/2021 0530   GLUCOSE 102 (H) 05/14/2021 0530   BUN 11 05/14/2021 0530   CREATININE 0.84 05/14/2021 0530   CALCIUM 8.8 (L) 05/14/2021 0530   GFRNONAA >60 05/14/2021 0530    BNP    Component Value Date/Time   BNP 377.0 (H) 05/13/2021 0857    ProBNP No results found for: PROBNP  Imaging: DG Chest 1 View  Result Date: 05/08/2021 CLINICAL DATA:  Status post thoracentesis. EXAM: CHEST  1 VIEW COMPARISON:  May 05, 2021. FINDINGS: No pneumothorax is noted. Left pleural effusion is significantly smaller. IMPRESSION: Left pleural effusion is significantly smaller status post thoracentesis. No pneumothorax is seen. Electronically Signed   By: Lupita RaiderJames  Green Jr M.D.   On: 05/08/2021 12:59   DG Chest 2 View  Result Date: 05/05/2021 CLINICAL DATA:  Status post CABG, shortness of breath EXAM: CHEST - 2 VIEW COMPARISON:  04/17/2021 FINDINGS: Moderate left pleural effusion with associated atelectasis, which is new from the prior  exam. The right lung is clear. Prior median sternotomy and CABG. Unchanged cardiac and mediastinal contours. No acute osseous abnormality. IMPRESSION: New moderate left pleural effusion with associated atelectasis. Electronically Signed   By: Wiliam KeAlison  Vasan M.D.   On: 05/05/2021 13:15   DG Chest Portable 1 View  Result Date: 05/13/2021 CLINICAL DATA:  Chest pain. EXAM: PORTABLE CHEST 1 VIEW COMPARISON:  May 08, 2021. FINDINGS: The heart size and mediastinal contours are within normal limits. Sternotomy wires are noted. Right lung is clear. Small left pleural effusion is noted with associated left basilar atelectasis or infiltrate. The visualized skeletal structures are unremarkable. IMPRESSION: Small left pleural effusion with associated left basilar atelectasis or infiltrate. Electronically Signed   By: Lupita RaiderJames  Green Jr M.D.   On: 05/13/2021 09:25   ECHOCARDIOGRAM COMPLETE  Result Date: 05/14/2021    ECHOCARDIOGRAM REPORT   Patient Name:   Kimberly AnisERESA B Bertino Date of Exam: 05/14/2021 Medical Rec #:  [id number]      Height:       63.0 in Accession #:    [id number]     Weight:       130.1 lb Date of Birth:  04/18/1941      BSA:          1.611 m Patient Age:    79 years       BP:           130/62 mmHg Patient Gender: F              HR:           92 bpm. Exam Location:  Inpatient Procedure: 2D Echo, Cardiac Doppler and Color Doppler Indications:    I48.91* Unspeicified atrial fibrillation  History:        Patient has prior history of Echocardiogram examinations, most  recent 03/24/2021. CAD; Risk Factors:Dyslipidemia.  Sonographer:    Eulah Pont RDCS Referring Phys: [id number] JENNIFER YATES IMPRESSIONS  1. Left ventricular ejection fraction, by estimation, is 65 to 70%. The left ventricle has normal function. The left ventricle has no regional wall motion abnormalities. Left ventricular diastolic parameters are consistent with Grade I diastolic dysfunction (impaired relaxation). Elevated left ventricular  end-diastolic pressure.  2. Right ventricular systolic function is mildly reduced. The right ventricular size is normal. There is normal pulmonary artery systolic pressure. The estimated right ventricular systolic pressure is 27.4 mmHg.  3. Large pleural effusion in the left lateral region.  4. The mitral valve is normal in structure. No evidence of mitral valve regurgitation. No evidence of mitral stenosis.  5. The aortic valve is tricuspid. Aortic valve regurgitation is not visualized. Mild aortic valve sclerosis is present, with no evidence of aortic valve stenosis.  6. The inferior vena cava is normal in size with greater than 50% respiratory variability, suggesting right atrial pressure of 3 mmHg. FINDINGS  Left Ventricle: Left ventricular ejection fraction, by estimation, is 65 to 70%. The left ventricle has normal function. The left ventricle has no regional wall motion abnormalities. The left ventricular internal cavity size was normal in size. There is  no left ventricular hypertrophy. Left ventricular diastolic parameters are consistent with Grade I diastolic dysfunction (impaired relaxation). Elevated left ventricular end-diastolic pressure. Right Ventricle: The right ventricular size is normal. No increase in right ventricular wall thickness. Right ventricular systolic function is mildly reduced. There is normal pulmonary artery systolic pressure. The tricuspid regurgitant velocity is 2.47 m/s, and with an assumed right atrial pressure of 3 mmHg, the estimated right ventricular systolic pressure is 27.4 mmHg. Left Atrium: Left atrial size was normal in size. Right Atrium: Right atrial size was normal in size. Pericardium: There is no evidence of pericardial effusion. Presence of pericardial fat pad. Mitral Valve: The mitral valve is normal in structure. There is mild calcification of the anterior mitral valve leaflet(s). Mild mitral annular calcification. No evidence of mitral valve regurgitation. No  evidence of mitral valve stenosis. Tricuspid Valve: The tricuspid valve is normal in structure. Tricuspid valve regurgitation is mild . No evidence of tricuspid stenosis. Aortic Valve: The aortic valve is tricuspid. Aortic valve regurgitation is not visualized. Mild aortic valve sclerosis is present, with no evidence of aortic valve stenosis. Pulmonic Valve: The pulmonic valve was normal in structure. Pulmonic valve regurgitation is not visualized. No evidence of pulmonic stenosis. Aorta: The aortic root is normal in size and structure. Venous: The inferior vena cava is normal in size with greater than 50% respiratory variability, suggesting right atrial pressure of 3 mmHg. IAS/Shunts: No atrial level shunt detected by color flow Doppler. Additional Comments: There is a large pleural effusion in the left lateral region.  LEFT VENTRICLE PLAX 2D LVIDd:         3.40 cm  Diastology LVIDs:         1.90 cm  LV e' medial:    5.09 cm/s LV PW:         0.90 cm  LV E/e' medial:  18.1 LV IVS:        1.00 cm  LV e' lateral:   6.73 cm/s LVOT diam:     1.90 cm  LV E/e' lateral: 13.7 LV SV:         77 LV SV Index:   48 LVOT Area:     2.84 cm  RIGHT VENTRICLE RV S  prime:     12.20 cm/s TAPSE (M-mode): 1.6 cm LEFT ATRIUM             Index       RIGHT ATRIUM           Index LA diam:        2.00 cm 1.24 cm/m  RA Area:     13.00 cm LA Vol (A2C):   31.4 ml 19.50 ml/m RA Volume:   30.90 ml  19.19 ml/m LA Vol (A4C):   34.6 ml 21.48 ml/m LA Biplane Vol: 35.0 ml 21.73 ml/m  AORTIC VALVE LVOT Vmax:   155.00 cm/s LVOT Vmean:  105.000 cm/s LVOT VTI:    0.271 m  AORTA Ao Root diam: 3.00 cm Ao Asc diam:  2.90 cm MITRAL VALVE                TRICUSPID VALVE MV Area (PHT): 3.77 cm     TR Peak grad:   24.4 mmHg MV Decel Time: 201 msec     TR Vmax:        247.00 cm/s MV E velocity: 92.00 cm/s MV A velocity: 105.00 cm/s  SHUNTS MV E/A ratio:  0.88         Systemic VTI:  0.27 m                             Systemic Diam: 1.90 cm Armanda Magic MD  Electronically signed by Armanda Magic MD Signature Date/Time: 05/14/2021/10:33:23 AM    Final       No flowsheet data found.  No results found for: NITRICOXIDE      Assessment & Plan:   No problem-specific Assessment & Plan notes found for this encounter.     Rubye Oaks, NP 05/29/2021

## 2021-05-30 NOTE — Progress Notes (Signed)
Cardiology Office Note   Date:  05/31/2021   ID:  Kimberly Calhoun, DOB Jul 30, 1941, MRN [id number]  PCP:  Ileana Ladd, MD    No chief complaint on file.  CAD  Wt Readings from Last 3 Encounters:  05/31/21 132 lb 12.8 oz (60.2 kg)  05/29/21 133 lb (60.3 kg)  05/18/21 132 lb (59.9 kg)       History of Present Illness: Kimberly Calhoun is a 80 y.o. female  with CAD.  Her husband was my patient, Kimberly Calhoun, and he passed away in 09/23/19.    In 2022-04-06she was having severe allergies.  She felt some chest wall muscle spasms which improved with treatment from a chiropractor.     She also had some DOE.  Worse when she had a new cat.  better since she got rid of the cat.  She thought it was allergy related.    She had unstable angina in July 2022 which led to cath showing multivessel disease.  "Coronary Artery Bypass Grafting x 3             Left Internal Mammary Artery to Distal Left Anterior Descending Coronary Artery, Saphenous Vein Graft to Posterior Descending Coronary Artery, Saphenous Vein Graft to  Diagonal Branch Coronary Artery, Endoscopic Vein Harvest from right thigh and Lower Leg"  She had post op AFib and pleural effusion, thoracentesis x 2.     Echo in 04/2021 showed: "Echo results as noted:  1. Left ventricular ejection fraction, by estimation, is 65 to 70%. The  left ventricle has normal function. The left ventricle has no regional  wall motion abnormalities. Left ventricular diastolic parameters are  consistent with Grade I diastolic  dysfunction (impaired relaxation). Elevated left ventricular end-diastolic  pressure.   2. Right ventricular systolic function is mildly reduced. The right  ventricular size is normal. There is normal pulmonary artery systolic  pressure. The estimated right ventricular systolic pressure is 27.4 mmHg.   3. Large pleural effusion in the left lateral region.   4. The mitral valve is normal in structure. No evidence of  mitral valve  regurgitation. No evidence of mitral stenosis.   5. The aortic valve is tricuspid. Aortic valve regurgitation is not  visualized. Mild aortic valve sclerosis is present, with no evidence of  aortic valve stenosis.   6. The inferior vena cava is normal in size with greater than 50%  respiratory variability, suggesting right atrial pressure of 3 mmHg. "  She has more fluid per pulmonary.  Chest x-ray was done 2 days ago.  She is waiting to hear whether or not she will need repeat thoracentesis.  She does report some dyspnea on exertion.  It resolves quickly after resting for few minutes.  Denies : Chest pain. Dizziness. Leg edema. Nitroglycerin use. Orthopnea. Palpitations. Paroxysmal nocturnal dyspnea. Syncope.      Past Medical History:  Diagnosis Date   Arthritis    Asthma    CAD (coronary artery disease)    s/p CABG   Chronic kidney disease, stage 3 (HCC)    Dyslipidemia 05/13/2021   Headache    Hypercholesteremia    Hyperlipidemia    Osteopenia    Personal history of colonic polyps    Pleural effusion    recurrent    Past Surgical History:  Procedure Laterality Date   ABDOMINAL HYSTERECTOMY  02/2013   COLONOSCOPY WITH PROPOFOL N/A 12/07/2014   Procedure: COLONOSCOPY WITH PROPOFOL;  Surgeon: Bernette Redbird, MD;  Location: [facility];  Service: Endoscopy;  Laterality: N/A;  ultraslim   CORONARY ARTERY BYPASS GRAFT N/A 03/24/2021   Procedure: CORONARY ARTERY BYPASS GRAFTING (CABG) TIMES THREE USING LEFT INTERNAL MAMMARY ARTERY AND RIGHT GREATER SAPHENOUS VEIN HARVESTED ENDOSCOPICALLY;  Surgeon: Linden Dolin, MD;  Location: [facility] OR;  Service: Open Heart Surgery;  Laterality: N/A;   IR THORACENTESIS ASP PLEURAL SPACE W/IMG GUIDE  04/17/2021   LEFT HEART CATH AND CORONARY ANGIOGRAPHY N/A 03/22/2021   Procedure: LEFT HEART CATH AND CORONARY ANGIOGRAPHY;  Surgeon: Lennette Bihari, MD;  Location: [facility] INVASIVE CV LAB;  Service: Cardiovascular;  Laterality: N/A;   TEE  WITHOUT CARDIOVERSION N/A 03/24/2021   Procedure: TRANSESOPHAGEAL ECHOCARDIOGRAM (TEE);  Surgeon: Linden Dolin, MD;  Location: [facility] OR;  Service: Open Heart Surgery;  Laterality: N/A;     Current Outpatient Medications  Medication Sig Dispense Refill   acetaminophen (TYLENOL) 500 MG tablet Take 500 mg by mouth every 6 (six) hours as needed for moderate pain or headache.     albuterol (VENTOLIN HFA) 108 (90 Base) MCG/ACT inhaler Inhale 1 puff into the lungs every 6 (six) hours as needed for shortness of breath.     amiodarone (PACERONE) 200 MG tablet Take 0.5 tablets (100 mg total) by mouth daily. 15 tablet 6   apixaban (ELIQUIS) 5 MG TABS tablet Take 1 tablet (5 mg total) by mouth 2 (two) times daily. 60 tablet 1   aspirin 81 MG EC tablet Take 81 mg by mouth daily. Swallow whole.     CALCIUM PO Take 1 tablet by mouth at bedtime as needed (For bones).     cetirizine (ZYRTEC) 10 MG tablet Take 10 mg by mouth daily.     Cholecalciferol (VITAMIN D PO) Take 1 tablet by mouth every morning.     ezetimibe (ZETIA) 10 MG tablet Take 1 tablet (10 mg total) by mouth daily. 30 tablet 1   fluticasone (FLONASE) 50 MCG/ACT nasal spray USE 2 SPRAYS IN EACH NOSTRIL DAILY FOR CLEAR RUNNY NOSE 48 g 4   metoprolol tartrate (LOPRESSOR) 25 MG tablet Take 2 tablets (50 mg total) by mouth 2 (two) times daily. 120 tablet 0   Multiple Vitamin (MULTIVITAMIN WITH MINERALS) TABS tablet Take 1 tablet by mouth at bedtime. Centrum silver.     Propylene Glycol (SYSTANE BALANCE OP) Apply 1 drop to eye daily as needed (dry eyes.).     vitamin C (ASCORBIC ACID) 500 MG tablet Take 500 mg by mouth every morning.     vitamin E 180 MG (400 UNITS) capsule Take 1 capsule (400 Units total) by mouth every morning. (Patient taking differently: Take 400 Units by mouth daily.)     zinc gluconate 50 MG tablet Take 50 mg by mouth daily.     No current facility-administered medications for this visit.    Allergies:   Macrobid  [nitrofurantoin], Mold extract [trichophyton], Statins, and Other    Social History:  The patient  reports that she has never smoked. She has never used smokeless tobacco. She reports that she does not drink alcohol and does not use drugs.   Family History:  The patient's family history includes Atrial fibrillation in her sister; Diabetes in her brother; Heart disease in her father; Rheum arthritis in her sister.    ROS:  Please see the history of present illness.   Otherwise, review of systems are positive for DOE.   All other systems are reviewed and negative.    PHYSICAL EXAM: VS:  BP 132/72   Pulse 82   Ht 5' 3.5" (1.613 m)   Wt 132 lb 12.8 oz (60.2 kg)   SpO2 94%   BMI 23.16 kg/m  , BMI Body mass index is 23.16 kg/m. GEN: Well nourished, well developed, in no acute distress HEENT: normal Neck: no JVD, carotid bruits, or masses Cardiac: RRR; no murmurs, rubs, or gallops,no edema  Respiratory: Decreased breath sounds at the left base GI: soft, nontender, nondistended, + BS MS: no deformity or atrophy Skin: warm and dry, no rash Neuro:  Strength and sensation are intact Psych: euthymic mood, full affect     Recent Labs: 05/13/2021: ALT 22; B Natriuretic Peptide 377.0; Magnesium 2.2; TSH 4.202 05/14/2021: BUN 11; Creatinine, Ser 0.84; Hemoglobin 11.3; Platelets 500; Potassium 3.8; Sodium 134   Lipid Panel    Component Value Date/Time   CHOL 270 (H) 03/22/2021 0149   TRIG 140 03/22/2021 0149   HDL 62 03/22/2021 0149   CHOLHDL 4.4 03/22/2021 0149   VLDL 28 03/22/2021 0149   LDLCALC 180 (H) 03/22/2021 0149     Other studies Reviewed: Additional studies/ records that were reviewed today with results demonstrating: hospital records reviewed.   ASSESSMENT AND PLAN:  CAD: No angina.  Continue aggressive secondary prevention.  Lipid-lowering therapy will be important.  Discussed with the patient that Zetia alone is unlikely to get her back to target. AFib: Back in NSR.   Low-dose amiodarone to maintain sinus rhythm. Anticoagulated: Eliquis for stroke prevention.  This will have to be held for 2 days at least prior to thoracentesis if this is done. Hyperlipidemia: Currently taking ezetimibe.  Intolerant of statins.  Will refer to Pharm.D. clinic to see whether injectable cholesterol-lowering medicine would be practical for her. Pleural effusion: We will CC Dr.  Tonia Brooms.  If thoracentesis is not performed, could try low-dose furosemide to help get rid of extra fluid.  She does not appear grossly volume overloaded.  She is hoping that thoracentesis will be performed so her dyspnea on exertion will improve.   Current medicines are reviewed at length with the patient today.  The patient concerns regarding her medicines were addressed.  The following changes have been made:  No change  Labs/ tests ordered today include:  No orders of the defined types were placed in this encounter.   Recommend 150 minutes/week of aerobic exercise Low fat, low carb, high fiber diet recommended  Disposition:   FU in 6 months   Signed, Lance Muss, MD  05/31/2021 12:04 PM    [facility] [address] [place], [place], [place]  [address] Phone: [phone]; Fax: [phone]

## 2021-05-31 ENCOUNTER — Other Ambulatory Visit: Payer: Self-pay

## 2021-05-31 ENCOUNTER — Ambulatory Visit: Payer: Medicare Other | Admitting: Interventional Cardiology

## 2021-05-31 ENCOUNTER — Encounter: Payer: Self-pay | Admitting: Interventional Cardiology

## 2021-05-31 VITALS — BP 132/72 | HR 82 | Ht 63.5 in | Wt 132.8 lb

## 2021-05-31 DIAGNOSIS — I421 Obstructive hypertrophic cardiomyopathy: Secondary | ICD-10-CM | POA: Diagnosis not present

## 2021-05-31 DIAGNOSIS — I480 Paroxysmal atrial fibrillation: Secondary | ICD-10-CM | POA: Diagnosis not present

## 2021-05-31 DIAGNOSIS — E782 Mixed hyperlipidemia: Secondary | ICD-10-CM | POA: Diagnosis not present

## 2021-05-31 DIAGNOSIS — I25700 Atherosclerosis of coronary artery bypass graft(s), unspecified, with unstable angina pectoris: Secondary | ICD-10-CM | POA: Diagnosis not present

## 2021-05-31 DIAGNOSIS — J90 Pleural effusion, not elsewhere classified: Secondary | ICD-10-CM

## 2021-05-31 NOTE — Patient Instructions (Signed)
Medication Instructions:  Your physician recommends that you continue on your current medications as directed. Please refer to the Current Medication list given to you today.  *If you need a refill on your cardiac medications before your next appointment, please call your pharmacy*   Lab Work: none If you have labs (blood work) drawn today and your tests are completely normal, you will receive your results only by: MyChart Message (if you have MyChart) OR A paper copy in the mail If you have any lab test that is abnormal or we need to change your treatment, we will call you to review the results.   Testing/Procedures: none   Follow-Up: At [facility], you and your health needs are our priority.  As part of our continuing mission to provide you with exceptional heart care, we have created designated Provider Care Teams.  These Care Teams include your primary Cardiologist (physician) and Advanced Practice Providers (APPs -  Physician Assistants and Nurse Practitioners) who all work together to provide you with the care you need, when you need it.  We recommend signing up for the patient portal called "MyChart".  Sign up information is provided on this After Visit Summary.  MyChart is used to connect with patients for Virtual Visits (Telemedicine).  Patients are able to view lab/test results, encounter notes, upcoming appointments, etc.  Non-urgent messages can be sent to your provider as well.   To learn more about what you can do with MyChart, go to [email or web address].    Your next appointment:   6 month(s)  The format for your next appointment:   In Person  Provider:   You may see Lance Muss, MD or one of the following Advanced Practice Providers on your designated Care Team:   Ronie Spies, PA-C Jacolyn Reedy, PA-C   Other Instructions You have been referred to the lipid clinic in our office.  Please schedule new patient appointment

## 2021-06-01 ENCOUNTER — Telehealth: Payer: Self-pay | Admitting: Adult Health

## 2021-06-01 NOTE — Telephone Encounter (Signed)
Called and spoke with patient to let her know results and recommendations from Tammy. Patient expressed understanding and is now double booked in 1:30 slot with Dr. Tonia Brooms on 06/07/21. Patient states that if anything should open sooner she would like it due to her getting short of breath a little more when walking room to room. Advised her I would route this to Dr. Tonia Brooms to let him know.     Julio Sicks, NP  06/01/2021  2:13 PM EDT     Chest x-ray shows persistent left-sided pleural effusion. Case discussed with Dr. Tonia Brooms patient will need a repeat thoracentesis. Please have her return to the office on September 21 it is okay to double book his 130 slot.  Patient is on Eliquis she will need to hold her Eliquis 2 days prior to procedure Called to discussed with patient unable to speak to her left a voicemail to return phone call

## 2021-06-01 NOTE — Telephone Encounter (Signed)
I called patient and she reports that she has increased shortness of breath and she reports that her heart rate such as its jumping around but she was not able to give her heart rates and she felt that she may need to be evaluated. She was advised to go to the ER if her symptoms became worse. She declined the ER at this time and is aware that she will go seek care at [facility] or ER if she feels more short of breath. I told her that I would let you know as well. Please advise.

## 2021-06-01 NOTE — Progress Notes (Signed)
I called patient and she reports that she has increased shortness of breath and she reports that her heart rate such as its jumping around but she was not able to give her heart rates and she felt that she may need to be evaluated. She was advised to go to the ER if her symptoms became worse. She declined the ER at this time and is aware that she will go seek care at [facility] or ER if she feels more short of breath. I told her that I would let you know as well. She did not have any questions.

## 2021-06-01 NOTE — Assessment & Plan Note (Signed)
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 Chest x-ray shows some reaccumulation of her left pleural effusion. 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  Most recent pleural fluid analysis showed elevated T and C's at 4300, elevated lymphocytes 61% and elevated eosinophils at 24%.  Patient is clinically stable.  Does not have significant symptom burden. Case was discussed with Dr. Tonia Brooms patient will returned next week for consideration of thoracentesis.

## 2021-06-02 NOTE — Progress Notes (Signed)
I called to check on the patient and she reports that she is feeling better and she called and talked with cardiology and he was going to call Dr. Tonia Brooms about the fluid on her lungs. She is aware of other recommendations and did not have any questions. She reports that he pulse is staying steady and has not jumped since last night.

## 2021-06-05 ENCOUNTER — Telehealth: Payer: Self-pay

## 2021-06-05 ENCOUNTER — Encounter: Payer: Self-pay | Admitting: Pharmacist

## 2021-06-05 MED ORDER — EZETIMIBE 10 MG PO TABS: 10.0000 mg | ORAL_TABLET | Freq: Every day | ORAL | 11 refills | Status: DC

## 2021-06-05 NOTE — Telephone Encounter (Signed)
Noted. No other concerns.

## 2021-06-05 NOTE — Telephone Encounter (Signed)
This encounter was created in error - please disregard.

## 2021-06-05 NOTE — Telephone Encounter (Signed)
Patient contacted the office requesting to speak with Jacques Earthly, PA as she had questions about Afib. She is s/p CABG x3 03/24/21 with Dr. Vickey Sages. She states that she has been having "episodes" of Afib and wanted more clarification. Advised that she needed to follow-up with her Cardiologist, Dr. Eldridge Dace for evaluation and work-up. All patient questions answered and she acknowledged receipt.

## 2021-06-07 ENCOUNTER — Ambulatory Visit: Payer: Medicare Other

## 2021-06-07 ENCOUNTER — Encounter: Payer: Self-pay | Admitting: Pulmonary Disease

## 2021-06-07 ENCOUNTER — Ambulatory Visit (INDEPENDENT_AMBULATORY_CARE_PROVIDER_SITE_OTHER): Payer: Medicare Other

## 2021-06-07 ENCOUNTER — Telehealth: Payer: Self-pay | Admitting: Pulmonary Disease

## 2021-06-07 ENCOUNTER — Other Ambulatory Visit: Payer: Self-pay | Admitting: Thoracic Surgery (Cardiothoracic Vascular Surgery)

## 2021-06-07 ENCOUNTER — Ambulatory Visit: Payer: Medicare Other | Admitting: Pulmonary Disease

## 2021-06-07 ENCOUNTER — Other Ambulatory Visit (HOSPITAL_COMMUNITY)
Admission: RE | Admit: 2021-06-07 | Discharge: 2021-06-07 | Disposition: A | Discharge status: Home / Self Care | Payer: Medicare Other | Source: Ambulatory Visit | Attending: Pulmonary Disease | Admitting: Pulmonary Disease

## 2021-06-07 ENCOUNTER — Other Ambulatory Visit: Payer: Self-pay

## 2021-06-07 VITALS — BP 128/70 | HR 121 | Ht 63.5 in | Wt 130.0 lb

## 2021-06-07 DIAGNOSIS — Z951 Presence of aortocoronary bypass graft: Secondary | ICD-10-CM

## 2021-06-07 DIAGNOSIS — J90 Pleural effusion, not elsewhere classified: Secondary | ICD-10-CM

## 2021-06-07 DIAGNOSIS — I480 Paroxysmal atrial fibrillation: Secondary | ICD-10-CM

## 2021-06-07 DIAGNOSIS — J9383 Other pneumothorax: Secondary | ICD-10-CM

## 2021-06-07 DIAGNOSIS — Z7901 Long term (current) use of anticoagulants: Secondary | ICD-10-CM | POA: Diagnosis not present

## 2021-06-07 DIAGNOSIS — R846 Abnormal cytological findings in specimens from respiratory organs and thorax: Secondary | ICD-10-CM | POA: Diagnosis not present

## 2021-06-07 LAB — BODY FLUID CELL COUNT WITH DIFFERENTIAL
Eos, Fluid: 1 %
Lymphs, Fluid: 96 %
Monocyte-Macrophage-Serous Fluid: 3 % — ABNORMAL LOW (ref 50–90)
Neutrophil Count, Fluid: 0 % (ref 0–25)
Other Cells, Fluid: 1 %
Total Nucleated Cell Count, Fluid: 3629 cu mm — ABNORMAL HIGH (ref 0–1000)

## 2021-06-07 NOTE — Progress Notes (Signed)
PCCM:  Post procedure chest x-ray with left-sided small apical pneumothorax and ex vacuo pneumothorax.  Located within the base.  Discussed imaging with patient as well as patient's daughter in the room. Patient is in no distress following procedure.  We will have repeat chest x-ray approximately 4 PM today.  If there is any expansion of the thorax she will need to have definitive drainage.   Kimberly Igo, DO [facility] 06/07/2021 3:02 PM    Repeat chest x-ray this afternoon.  May be mild increased in the apex.  Overall stable.  Still no expansion of the atelectatic lung in the left base.  I think she has a large component of ex vacuo pneumothorax.  Patient is clinically stable vital signs are stable.  She has no symptoms.  No chest pain no shortness of breath.  I think we feel comfortable watching this conservatively.  Patient was counseled heavily on the importance of going to the nearest emergency room if she develops acute chest pain shortness of breath or change in symptoms that get worse over time or do not resolve on their own.  I discussed this with the patient as well as the daughter.  We also have ordered a repeat chest x-ray to be completed.  She can have this done tomorrow at her office visit with Dr. Cliffton Asters, thoracic surgery.  Kimberly Igo, DO  Pulmonary Critical Care 06/07/2021 4:32 PM

## 2021-06-07 NOTE — Progress Notes (Signed)
Synopsis: Referred in August 2022 for pleural effusion by Dr. Irving Copas, PCP: by Ileana Ladd, MD  Subjective:   PATIENT ID: Kimberly Calhoun GENDER: female DOB: [birth year], MRN: [id number]  Chief Complaint  Patient presents with   Follow-up    OV for thoracentesis. States she has been losing weight and unsure why.     [name], PMH CAD s/p CABG, CKD, HLD. Presents to the office s/p cabg with recurrent left sided effusion. She was on eliquis that was held for todays office visit with plans for thoracentesis. From a respiratory standpoint she is feeling ok but she notices SOB and DOE at times.   OV 06/07/2021: Here today for evaluation of recurrent left-sided pleural effusion.  Patient unfortunately has had reaccumulation of this effusion and feels short of breath still.  Also has recently seen cardiology.  Office note reviewed.  Case discussed with Rikki Spearing, NP that saw patient in the office.   Past Medical History:  Diagnosis Date   Arthritis    Asthma    CAD (coronary artery disease)    s/p CABG   Chronic kidney disease, stage 3 (HCC)    Dyslipidemia 05/13/2021   Headache    Hypercholesteremia    Hyperlipidemia    Osteopenia    Personal history of colonic polyps    Pleural effusion    recurrent     Family History  Problem Relation Age of Onset   Heart disease Father    Atrial fibrillation Sister    Rheum arthritis Sister    Diabetes Brother    Breast cancer Neg Hx      Past Surgical History:  Procedure Laterality Date   ABDOMINAL HYSTERECTOMY  02/2013   COLONOSCOPY WITH PROPOFOL N/A 12/07/2014   Procedure: COLONOSCOPY WITH PROPOFOL;  Surgeon: Bernette Redbird, MD;  Location: [facility];  Service: Endoscopy;  Laterality: N/A;  ultraslim   CORONARY ARTERY BYPASS GRAFT N/A 03/24/2021   Procedure: CORONARY ARTERY BYPASS GRAFTING (CABG) TIMES THREE USING LEFT INTERNAL MAMMARY ARTERY AND RIGHT GREATER SAPHENOUS VEIN HARVESTED ENDOSCOPICALLY;  Surgeon: Linden Dolin, MD;  Location: [facility] OR;  Service: Open Heart Surgery;  Laterality: N/A;   IR THORACENTESIS ASP PLEURAL SPACE W/IMG GUIDE  04/17/2021   LEFT HEART CATH AND CORONARY ANGIOGRAPHY N/A 03/22/2021   Procedure: LEFT HEART CATH AND CORONARY ANGIOGRAPHY;  Surgeon: Lennette Bihari, MD;  Location: [facility] INVASIVE CV LAB;  Service: Cardiovascular;  Laterality: N/A;   TEE WITHOUT CARDIOVERSION N/A 03/24/2021   Procedure: TRANSESOPHAGEAL ECHOCARDIOGRAM (TEE);  Surgeon: Linden Dolin, MD;  Location: [facility] OR;  Service: Open Heart Surgery;  Laterality: N/A;    Social History   Socioeconomic History   Marital status: Widowed    Spouse name: Not on file   Number of children: 1   Years of education: Not on file   Highest education level: Not on file  Occupational History   Occupation: retired  Tobacco Use   Smoking status: Never   Smokeless tobacco: Never  Vaping Use   Vaping Use: Never used  Substance and Sexual Activity   Alcohol use: No   Drug use: No   Sexual activity: Not on file  Other Topics Concern   Not on file  Social History Narrative   Not on file   Social Determinants of Health   Financial Resource Strain: Not on file  Food Insecurity: Not on file  Transportation Needs: Not on file  Physical Activity: Not on file  Stress:  Not on file  Social Connections: Not on file  Intimate Partner Violence: Not on file     Allergies  Allergen Reactions   Macrobid [Nitrofurantoin] Other (See Comments)    Cold chills, muscle weakness.   Mold Extract [Trichophyton] Other (See Comments)    Allergies.    Statins Other (See Comments)   Other Other (See Comments)    Allergies.   Mold in Cheese - stuffy nose and headache  Chocolate- stuffy nose and headache  Mildew - stuffy nose and headache     Outpatient Medications Prior to Visit  Medication Sig Dispense Refill   acetaminophen (TYLENOL) 500 MG tablet Take 500 mg by mouth every 6 (six) hours as needed for moderate pain or headache.      albuterol (VENTOLIN HFA) 108 (90 Base) MCG/ACT inhaler Inhale 1 puff into the lungs every 6 (six) hours as needed for shortness of breath.     amiodarone (PACERONE) 200 MG tablet Take 0.5 tablets (100 mg total) by mouth daily. 15 tablet 6   apixaban (ELIQUIS) 5 MG TABS tablet Take 1 tablet (5 mg total) by mouth 2 (two) times daily. 60 tablet 1   aspirin 81 MG EC tablet Take 81 mg by mouth daily. Swallow whole.     CALCIUM PO Take 1 tablet by mouth at bedtime as needed (For bones).     cetirizine (ZYRTEC) 10 MG tablet Take 10 mg by mouth daily.     Cholecalciferol (VITAMIN D PO) Take 1 tablet by mouth every morning.     ezetimibe (ZETIA) 10 MG tablet Take 1 tablet (10 mg total) by mouth daily. 30 tablet 11   fluticasone (FLONASE) 50 MCG/ACT nasal spray USE 2 SPRAYS IN EACH NOSTRIL DAILY FOR CLEAR RUNNY NOSE 48 g 4   metoprolol tartrate (LOPRESSOR) 25 MG tablet Take 2 tablets (50 mg total) by mouth 2 (two) times daily. 120 tablet 0   Multiple Vitamin (MULTIVITAMIN WITH MINERALS) TABS tablet Take 1 tablet by mouth at bedtime. Centrum silver.     Propylene Glycol (SYSTANE BALANCE OP) Apply 1 drop to eye daily as needed (dry eyes.).     vitamin C (ASCORBIC ACID) 500 MG tablet Take 500 mg by mouth every morning.     vitamin E 180 MG (400 UNITS) capsule Take 1 capsule (400 Units total) by mouth every morning. (Patient taking differently: Take 400 Units by mouth daily.)     zinc gluconate 50 MG tablet Take 50 mg by mouth daily.     No facility-administered medications prior to visit.    Review of Systems  Constitutional:  Negative for chills, fever, malaise/fatigue and weight loss.  HENT:  Negative for hearing loss, sore throat and tinnitus.   Eyes:  Negative for blurred vision and double vision.  Respiratory:  Positive for shortness of breath. Negative for cough, hemoptysis, sputum production, wheezing and stridor.   Cardiovascular:  Negative for chest pain, palpitations, orthopnea, leg swelling and  PND.  Gastrointestinal:  Negative for abdominal pain, constipation, diarrhea, heartburn, nausea and vomiting.  Genitourinary:  Negative for dysuria, hematuria and urgency.  Musculoskeletal:  Negative for joint pain and myalgias.  Skin:  Negative for itching and rash.  Neurological:  Negative for dizziness, tingling, weakness and headaches.  Endo/Heme/Allergies:  Negative for environmental allergies. Does not bruise/bleed easily.  Psychiatric/Behavioral:  Negative for depression. The patient is not nervous/anxious and does not have insomnia.   All other systems reviewed and are negative.   Objective:  Physical Exam  Vitals reviewed.  Constitutional:      General: She is not in acute distress.    Appearance: She is well-developed.  HENT:     Head: Normocephalic and atraumatic.  Eyes:     General: No scleral icterus.    Conjunctiva/sclera: Conjunctivae normal.     Pupils: Pupils are equal, round, and reactive to light.  Neck:     Vascular: No JVD.     Trachea: No tracheal deviation.  Cardiovascular:     Rate and Rhythm: Normal rate and regular rhythm.     Heart sounds: Normal heart sounds. No murmur heard. Pulmonary:     Effort: Pulmonary effort is normal. No tachypnea, accessory muscle usage or respiratory distress.     Breath sounds: No stridor. No wheezing, rhonchi or rales.  Abdominal:     General: Bowel sounds are normal. There is no distension.     Palpations: Abdomen is soft.     Tenderness: There is no abdominal tenderness.  Musculoskeletal:        General: No tenderness.     Cervical back: Neck supple.  Lymphadenopathy:     Cervical: No cervical adenopathy.  Skin:    General: Skin is warm and dry.     Capillary Refill: Capillary refill takes less than 2 seconds.     Findings: No rash.  Neurological:     Mental Status: She is alert and oriented to person, place, and time.  Psychiatric:        Behavior: Behavior normal.     Vitals:   06/07/21 1324  BP: 128/70   Pulse: (!) 121  SpO2: 97%  Weight: 130 lb (59 kg)  Height: 5' 3.5" (1.613 m)    97% on RA BMI Readings from Last 3 Encounters:  06/07/21 22.67 kg/m  05/31/21 23.16 kg/m  05/29/21 23.19 kg/m   Wt Readings from Last 3 Encounters:  06/07/21 130 lb (59 kg)  05/31/21 132 lb 12.8 oz (60.2 kg)  05/29/21 133 lb (60.3 kg)     CBC    Component Value Date/Time   WBC 10.0 05/14/2021 0530   RBC 3.90 05/14/2021 0530   HGB 11.3 (L) 05/14/2021 0530   HCT 34.5 (L) 05/14/2021 0530   PLT 500 (H) 05/14/2021 0530   MCV 88.5 05/14/2021 0530   MCH 29.0 05/14/2021 0530   MCHC 32.8 05/14/2021 0530   RDW 14.6 05/14/2021 0530   LYMPHSABS 1.1 05/13/2021 0856   MONOABS 0.9 05/13/2021 0856   EOSABS 0.1 05/13/2021 0856   BASOSABS 0.1 05/13/2021 0856     Chest Imaging: CXR 05/05/2021: Left sided effusion The patient's images have been independently reviewed by me.    Pulmonary Functions Testing Results: No flowsheet data found.  FeNO:   Pathology:   Echocardiogram:   Heart Catheterization:     Assessment & Plan:     ICD-10-CM   1. Pleural effusion, left  J90 DG Chest 2 View    Cytology - non gyn    Body fluid cell count with differential    Lactate Dehydrogenase (LDH)    Protein, total    Glucose    Albumin    Albumin    Glucose    Protein, total    Lactate Dehydrogenase (LDH)    Body fluid cell count with differential    CANCELED: Albumin    CANCELED: Glucose    CANCELED: Protein, total    CANCELED: Lactate Dehydrogenase (LDH)    2. S/P CABG (coronary artery bypass graft)  Z95.1     3. S/P CABG x 3  Z95.1     4. Paroxysmal atrial fibrillation (HCC)  I48.0     5. On continuous oral anticoagulation  Z79.01       Discussion:  80 year old female, recurrent left-sided pleural effusion status postthoracentesis x2, history of CABG.  Still has ongoing shortness of breath.  Atrial fibrillation on oral anticoagulation.  She has held her oral anticoagulation  today.  Plan: Discussed risk benefits and alternatives of proceeding with thoracentesis in the office. We discussed bleeding and pneumothorax. Patient is agreeable to proceed. Please see separate procedure note.   Current Outpatient Medications:    acetaminophen (TYLENOL) 500 MG tablet, Take 500 mg by mouth every 6 (six) hours as needed for moderate pain or headache., Disp: , Rfl:    albuterol (VENTOLIN HFA) 108 (90 Base) MCG/ACT inhaler, Inhale 1 puff into the lungs every 6 (six) hours as needed for shortness of breath., Disp: , Rfl:    amiodarone (PACERONE) 200 MG tablet, Take 0.5 tablets (100 mg total) by mouth daily., Disp: 15 tablet, Rfl: 6   apixaban (ELIQUIS) 5 MG TABS tablet, Take 1 tablet (5 mg total) by mouth 2 (two) times daily., Disp: 60 tablet, Rfl: 1   aspirin 81 MG EC tablet, Take 81 mg by mouth daily. Swallow whole., Disp: , Rfl:    CALCIUM PO, Take 1 tablet by mouth at bedtime as needed (For bones)., Disp: , Rfl:    cetirizine (ZYRTEC) 10 MG tablet, Take 10 mg by mouth daily., Disp: , Rfl:    Cholecalciferol (VITAMIN D PO), Take 1 tablet by mouth every morning., Disp: , Rfl:    ezetimibe (ZETIA) 10 MG tablet, Take 1 tablet (10 mg total) by mouth daily., Disp: 30 tablet, Rfl: 11   fluticasone (FLONASE) 50 MCG/ACT nasal spray, USE 2 SPRAYS IN EACH NOSTRIL DAILY FOR CLEAR RUNNY NOSE, Disp: 48 g, Rfl: 4   metoprolol tartrate (LOPRESSOR) 25 MG tablet, Take 2 tablets (50 mg total) by mouth 2 (two) times daily., Disp: 120 tablet, Rfl: 0   Multiple Vitamin (MULTIVITAMIN WITH MINERALS) TABS tablet, Take 1 tablet by mouth at bedtime. Centrum silver., Disp: , Rfl:    Propylene Glycol (SYSTANE BALANCE OP), Apply 1 drop to eye daily as needed (dry eyes.)., Disp: , Rfl:    vitamin C (ASCORBIC ACID) 500 MG tablet, Take 500 mg by mouth every morning., Disp: , Rfl:    vitamin E 180 MG (400 UNITS) capsule, Take 1 capsule (400 Units total) by mouth every morning. (Patient taking differently:  Take 400 Units by mouth daily.), Disp: , Rfl:    zinc gluconate 50 MG tablet, Take 50 mg by mouth daily., Disp: , Rfl:   I spent 42 minutes dedicated to the care of this patient on the date of this encounter to include pre-visit review of records, face-to-face time with the patient discussing conditions above, post visit ordering of testing, clinical documentation with the electronic health record, making appropriate referrals as documented, and communicating necessary findings to members of the patients care team.    Josephine Igo, DO [facility] 06/07/2021 2:46 PM

## 2021-06-07 NOTE — Progress Notes (Signed)
Thoracentesis  Procedure Note  Kimberly Calhoun  [id number]  May 05, 1941  Date:06/07/21  Time:1:55 PM   Provider Performing:Daryl Beehler L Mckinnley Smithey   Procedure: Thoracentesis with imaging guidance (54627)  Indication(s) Pleural Effusion  Consent Risks of the procedure as well as the alternatives and risks of each were explained to the patient and/or caregiver.  Consent for the procedure was obtained and is signed in the bedside chart  Anesthesia Topical only with 1% lidocaine    Time Out Verified patient identification, verified procedure, site/side was marked, verified correct patient position, special equipment/implants available, medications/allergies/relevant history reviewed, required imaging and test results available.  Sterile Technique Maximal sterile technique including full sterile barrier drape, hand hygiene, sterile gown, sterile gloves, mask, hair covering, sterile ultrasound probe cover (if used).  Procedure Description Ultrasound was used to identify appropriate pleural anatomy for placement and overlying skin marked.  Area of drainage cleaned and draped in sterile fashion. Lidocaine was used to anesthetize the skin and subcutaneous tissue.  550 cc's of amber appearing fluid was drained from the left pleural space. Catheter then removed and bandaid applied to site.  Complications/Tolerance None; patient tolerated the procedure well. Chest X-ray is ordered to confirm no post-procedural complication.  EBL Minimal  Specimen(s) Pleural fluid      Josephine Igo, DO [facility] 06/07/2021 1:56 PM

## 2021-06-07 NOTE — Telephone Encounter (Signed)
Noted  

## 2021-06-07 NOTE — Telephone Encounter (Signed)
Call report going to Dr. Tonia Brooms  IMPRESSION: New small-moderate-sized left-sided pneumothorax post thoracentesis.   These results will be called to the ordering clinician or representative by the Radiologist Assistant, and communication documented in the PACS or [employer].     Electronically Signed   By: Duanne Guess D.O.   On: 06/07/2021 14:30

## 2021-06-07 NOTE — Telephone Encounter (Signed)
SECOND CALL REPORT FOR CXR   IMPRESSION: 1. Mild expansion of the apical component of the LEFT pneumothorax. 2. Stable LEFT sub pulmonic pneumothorax with poor expansion of the atelectatic LEFT lower lobe following thoracentesis.   These results will be called to the ordering clinician or representative by the Radiologist Assistant, and communication documented in the PACS or [employer].     Electronically Signed   By: Genevive Bi M.D.   On: 06/07/2021 16:20

## 2021-06-07 NOTE — Patient Instructions (Signed)
Thank you for visiting Dr. Tonia Brooms at [facility]. Today we recommend the following:  Orders Placed This Encounter  Procedures   DG Chest 2 View   DG Chest 2 View   DG Chest 2 View   Body fluid cell count with differential   Lactate Dehydrogenase (LDH)   Protein, total   Glucose   Albumin    Return if symptoms worsen or fail to improve.  If you develop worsening shortness of breath or chest pain please go to the nearest emergency room.  We will have a repeat chest x-ray tomorrow prior to your office visit with Dr. Cliffton Asters.    Please do your part to reduce the spread of COVID-19.

## 2021-06-08 ENCOUNTER — Encounter: Payer: Self-pay | Admitting: Physician Assistant

## 2021-06-08 ENCOUNTER — Ambulatory Visit: Payer: Medicare Other

## 2021-06-08 ENCOUNTER — Ambulatory Visit
Admission: RE | Admit: 2021-06-08 | Discharge: 2021-06-08 | Disposition: A | Discharge status: Home / Self Care | Payer: Medicare Other | Source: Ambulatory Visit | Attending: Thoracic Surgery (Cardiothoracic Vascular Surgery) | Admitting: Thoracic Surgery (Cardiothoracic Vascular Surgery)

## 2021-06-08 ENCOUNTER — Ambulatory Visit (INDEPENDENT_AMBULATORY_CARE_PROVIDER_SITE_OTHER): Payer: Self-pay | Admitting: Physician Assistant

## 2021-06-08 ENCOUNTER — Telehealth: Payer: Self-pay | Admitting: Pulmonary Disease

## 2021-06-08 VITALS — BP 137/80 | HR 96 | Resp 20 | Ht 62.0 in | Wt 129.0 lb

## 2021-06-08 DIAGNOSIS — Z951 Presence of aortocoronary bypass graft: Secondary | ICD-10-CM

## 2021-06-08 DIAGNOSIS — J90 Pleural effusion, not elsewhere classified: Secondary | ICD-10-CM

## 2021-06-08 LAB — PROTEIN, PLEURAL OR PERITONEAL FLUID: Total protein, fluid: 4.5 g/dL

## 2021-06-08 LAB — ALBUMIN, PLEURAL OR PERITONEAL FLUID: Albumin, Fluid: 2.7 g/dL

## 2021-06-08 LAB — LACTATE DEHYDROGENASE, PLEURAL OR PERITONEAL FLUID: LD, Fluid: 90 U/L — ABNORMAL HIGH (ref 3–23)

## 2021-06-08 LAB — GLUCOSE, PLEURAL OR PERITONEAL FLUID: Glucose, Fluid: 102 mg/dL

## 2021-06-08 MED ORDER — POTASSIUM CHLORIDE ER 20 MEQ PO TBCR: 20.0000 meq | EXTENDED_RELEASE_TABLET | Freq: Every day | ORAL | 0 refills | Status: DC

## 2021-06-08 MED ORDER — APIXABAN 5 MG PO TABS
5.0000 mg | ORAL_TABLET | Freq: Two times a day (BID) | ORAL | 2 refills | Status: DC
Start: 2021-06-08 — End: 2021-09-12

## 2021-06-08 MED ORDER — FUROSEMIDE 40 MG PO TABS: 40.0000 mg | ORAL_TABLET | Freq: Every day | ORAL | 0 refills | Status: DC

## 2021-06-08 NOTE — Progress Notes (Signed)
[address]       [place] [address]             [phone]       Kimberly Calhoun is a 80 y.o. female patient who is s/p CABG back in early August with Dr. Renaldo Fiddler. She underwent a thoracentesis in Dr. Myrlene Broker office and had a repeat CXR yesterday. It showed a resolving apical pneumo and a residual pleural effusion which is small. She is still having extensive SOB with limited activity. I do not think this is due to her hydropneumothorax since her symptoms do not match up.    No diagnosis found. Past Medical History:  Diagnosis Date   Arthritis    Asthma    CAD (coronary artery disease)    s/p CABG   Chronic kidney disease, stage 3 (HCC)    Dyslipidemia 05/13/2021   Headache    Hypercholesteremia    Hyperlipidemia    Osteopenia    Personal history of colonic polyps    Pleural effusion    recurrent   No past surgical history pertinent negatives on file. Scheduled Meds: Current Outpatient Medications on File Prior to Visit  Medication Sig Dispense Refill   acetaminophen (TYLENOL) 500 MG tablet Take 500 mg by mouth every 6 (six) hours as needed for moderate pain or headache.     albuterol (VENTOLIN HFA) 108 (90 Base) MCG/ACT inhaler Inhale 1 puff into the lungs every 6 (six) hours as needed for shortness of breath.     amiodarone (PACERONE) 200 MG tablet Take 0.5 tablets (100 mg total) by mouth daily. 15 tablet 6   apixaban (ELIQUIS) 5 MG TABS tablet Take 1 tablet (5 mg total) by mouth 2 (two) times daily. 60 tablet 2   aspirin 81 MG EC tablet Take 81 mg by mouth daily. Swallow whole.     CALCIUM PO Take 1 tablet by mouth at bedtime as needed (For bones).     cetirizine (ZYRTEC) 10 MG tablet Take 10 mg by mouth daily.     Cholecalciferol (VITAMIN D PO) Take 1 tablet by mouth every morning.     ezetimibe (ZETIA) 10 MG tablet Take 1 tablet (10 mg total) by mouth daily. 30 tablet 11   fluticasone (FLONASE) 50 MCG/ACT nasal spray USE 2 SPRAYS IN EACH NOSTRIL  DAILY FOR CLEAR RUNNY NOSE 48 g 4   metoprolol tartrate (LOPRESSOR) 25 MG tablet Take 2 tablets (50 mg total) by mouth 2 (two) times daily. 120 tablet 0   Multiple Vitamin (MULTIVITAMIN WITH MINERALS) TABS tablet Take 1 tablet by mouth at bedtime. Centrum silver.     Propylene Glycol (SYSTANE BALANCE OP) Apply 1 drop to eye daily as needed (dry eyes.).     vitamin C (ASCORBIC ACID) 500 MG tablet Take 500 mg by mouth every morning.     vitamin E 180 MG (400 UNITS) capsule Take 1 capsule (400 Units total) by mouth every morning. (Patient taking differently: Take 400 Units by mouth daily.)     zinc gluconate 50 MG tablet Take 50 mg by mouth daily.     No current facility-administered medications on file prior to visit.     Allergies  Allergen Reactions   Macrobid [Nitrofurantoin] Other (See Comments)    Cold chills, muscle weakness.   Mold Extract [Trichophyton] Other (See Comments)    Allergies.    Statins Other (See Comments)   Other Other (See Comments)    Allergies.  Mold in Cheese - stuffy nose and headache  Chocolate- stuffy nose and headache  Mildew - stuffy nose and headache   Active Problems:   * No active hospital problems. *   Objective: Vital signs (most recent): There were no vitals taken for this visit. Vitals:   06/08/21 1521  BP: 137/80  Pulse: 96  Resp: 20  SpO2: 95%    Cor: irregularly irregular, no murmur Pulm: CTA bilaterally and in all fields Abd: no tenderness Incision: well healed Ext: no edema  CLINICAL DATA:  Follow-up thoracentesis   EXAM: CHEST - 2 VIEW   COMPARISON:  Chest radiograph 06/07/2021 at 204 hours   FINDINGS: Again demonstrated sub pulmonic pneumothorax on the LEFT with no interval change. Poor expansion of the atelectatic LEFT lower lobe following thoracentesis.   LEFT apical pneumothorax component with pleural edge measuring 2.1 cm from the apical chest wall which is slightly increased from 1.3 cm on radiograph earlier  same day.   RIGHT lung clear.  No mediastinal shift   IMPRESSION: 1. Mild expansion of the apical component of the LEFT pneumothorax. 2. Stable LEFT sub pulmonic pneumothorax with poor expansion of the atelectatic LEFT lower lobe following thoracentesis.   These results will be called to the ordering clinician or representative by the Radiologist Assistant, and communication documented in the PACS or [employer].     Electronically Signed   By: Genevive Bi M.D.   On: 06/07/2021 16:20  CLINICAL DATA:  Pneumothorax   EXAM: CHEST - 2 VIEW   COMPARISON:  06/07/2021, 05/29/2021   FINDINGS: Left hydropneumothorax appears relatively stable in size within increasing fluid component seen at the left lung base. Pleural thickening noted at the left lung base and associated basilar collapse and consolidation may reflect changes of an underlying trapped lung. No evidence of mediastinal shift or left hemithorax hyperexpansion to suggest tension physiology. Right lung is clear. No pneumothorax or significant pleural effusion on the right. Median sternotomy has been performed. Cardiac size within normal limits. Pulmonary vascularity is normal.   IMPRESSION: Stable left hydropneumothorax with increasing fluid component. 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     Electronically Signed   By: Helyn Numbers M.D.   On: 06/08/2021 15:47    Assessment & Plan  CXR with continued hydropneumothorax. She will need a CT scan and further work-up done at Dr. Myrlene Broker office for possible trapped lung. I also recommended PFTs if these have not already been done. The patient is doing well post surgery but now is struggling with her breathing. I did order lasix for 5 days with potassium replacement. Ultimately, she may require a pleurx catheter placement or further surgery if this cannot be resolved. She would like to avoid further surgery if  possible.   I do no think she needs to return to see [place] unless she needs surgery. I will discharge her back to Dr. Tonia Brooms and our practice will be available if needed.   Sharlene Dory 06/08/2021

## 2021-06-08 NOTE — Addendum Note (Signed)
Addended by: Kyllie Pettijohn E on: 06/08/2021 08:49 AM   Modules accepted: Orders

## 2021-06-08 NOTE — Telephone Encounter (Signed)
Call received from [facility] PA from Cardiothoracic Surgeons office and advises that patient needs f/u with BI, a PFT and CT. Pt currently has a pneumothorax.    Dr. Tonia Brooms please advise

## 2021-06-08 NOTE — Telephone Encounter (Signed)
Pt seen on 9/21 by Dr. Tonia Brooms. Will close encounter.

## 2021-06-08 NOTE — Progress Notes (Deleted)
Patient ID: Kimberly Calhoun                 DOB: 05/19/1941                    MRN: [id number]     HPI: Kimberly Calhoun is a 80 y.o. female patient referred to lipid clinic by Dr Eldridge Dace. PMH is significant for unstable angina in 03/2021 which led to cath showing multivessel CAD for which she underwent CABG x3, afib, and HLD.  She reports history of statin-intolerance and was referred to lipid clinic to discuss other options.  Need specific statin names Already mentioned pcsk9i and leqvio on the phone - pcsk9i would be covered better most likely but also already on eliquis (should be 47/mo but in donut hole?), what is that copay? Could submit for leqvio to see Send in eliquis refill  Current Medications: ezetimibe 10mg  daily Intolerances: statins Risk Factors: CAD s/p CABG LDL goal: 70mg /dL  Diet:   Exercise:   Family History: Atrial fibrillation in her sister; Diabetes in her brother; Heart disease in her father; Rheum arthritis in her sister.   Social History: Denies tobacco, alcohol, and drug use.  Labs:  Past Medical History:  Diagnosis Date   Arthritis    Asthma    CAD (coronary artery disease)    s/p CABG   Chronic kidney disease, stage 3 (HCC)    Dyslipidemia 05/13/2021   Headache    Hypercholesteremia    Hyperlipidemia    Osteopenia    Personal history of colonic polyps    Pleural effusion    recurrent    Current Outpatient Medications on File Prior to Visit  Medication Sig Dispense Refill   acetaminophen (TYLENOL) 500 MG tablet Take 500 mg by mouth every 6 (six) hours as needed for moderate pain or headache.     albuterol (VENTOLIN HFA) 108 (90 Base) MCG/ACT inhaler Inhale 1 puff into the lungs every 6 (six) hours as needed for shortness of breath.     amiodarone (PACERONE) 200 MG tablet Take 0.5 tablets (100 mg total) by mouth daily. 15 tablet 6   apixaban (ELIQUIS) 5 MG TABS tablet Take 1 tablet (5 mg total) by mouth 2 (two) times daily. 60 tablet 1   aspirin  81 MG EC tablet Take 81 mg by mouth daily. Swallow whole.     CALCIUM PO Take 1 tablet by mouth at bedtime as needed (For bones).     cetirizine (ZYRTEC) 10 MG tablet Take 10 mg by mouth daily.     Cholecalciferol (VITAMIN D PO) Take 1 tablet by mouth every morning.     ezetimibe (ZETIA) 10 MG tablet Take 1 tablet (10 mg total) by mouth daily. 30 tablet 11   fluticasone (FLONASE) 50 MCG/ACT nasal spray USE 2 SPRAYS IN EACH NOSTRIL DAILY FOR CLEAR RUNNY NOSE 48 g 4   metoprolol tartrate (LOPRESSOR) 25 MG tablet Take 2 tablets (50 mg total) by mouth 2 (two) times daily. 120 tablet 0   Multiple Vitamin (MULTIVITAMIN WITH MINERALS) TABS tablet Take 1 tablet by mouth at bedtime. Centrum silver.     Propylene Glycol (SYSTANE BALANCE OP) Apply 1 drop to eye daily as needed (dry eyes.).     vitamin C (ASCORBIC ACID) 500 MG tablet Take 500 mg by mouth every morning.     vitamin E 180 MG (400 UNITS) capsule Take 1 capsule (400 Units total) by mouth every morning. (Patient taking differently: Take 400  Units by mouth daily.)     zinc gluconate 50 MG tablet Take 50 mg by mouth daily.     No current facility-administered medications on file prior to visit.    Allergies  Allergen Reactions   Macrobid [Nitrofurantoin] Other (See Comments)    Cold chills, muscle weakness.   Mold Extract [Trichophyton] Other (See Comments)    Allergies.    Statins Other (See Comments)   Other Other (See Comments)    Allergies.   Mold in Cheese - stuffy nose and headache  Chocolate- stuffy nose and headache  Mildew - stuffy nose and headache    Assessment/Plan:  1. Hyperlipidemia -

## 2021-06-09 ENCOUNTER — Encounter (HOSPITAL_COMMUNITY): Payer: Self-pay

## 2021-06-09 ENCOUNTER — Telehealth (HOSPITAL_COMMUNITY): Payer: Self-pay

## 2021-06-09 ENCOUNTER — Other Ambulatory Visit: Payer: Self-pay | Admitting: *Deleted

## 2021-06-09 ENCOUNTER — Other Ambulatory Visit: Payer: Self-pay | Admitting: Physician Assistant

## 2021-06-09 DIAGNOSIS — J90 Pleural effusion, not elsewhere classified: Secondary | ICD-10-CM

## 2021-06-09 LAB — CYTOLOGY - NON PAP

## 2021-06-09 LAB — SURGICAL PATHOLOGY

## 2021-06-09 NOTE — Progress Notes (Unsigned)
T

## 2021-06-09 NOTE — Telephone Encounter (Signed)
Attempted to call patient in regards to Cardiac Rehab - LM on VM Mailed letter 

## 2021-06-09 NOTE — Telephone Encounter (Signed)
Pt returned CR phone call and stated she is interested in CR. She will come in for orientation on 07/04/21 @ 10AM and will attend the 9:15AM class.   Mailed letter

## 2021-06-11 ENCOUNTER — Other Ambulatory Visit: Payer: Self-pay | Admitting: Physician Assistant

## 2021-06-12 ENCOUNTER — Ambulatory Visit
Admission: RE | Admit: 2021-06-12 | Discharge: 2021-06-12 | Disposition: A | Discharge status: Home / Self Care | Payer: Medicare Other | Source: Ambulatory Visit | Attending: Thoracic Surgery (Cardiothoracic Vascular Surgery) | Admitting: Thoracic Surgery (Cardiothoracic Vascular Surgery)

## 2021-06-12 DIAGNOSIS — J90 Pleural effusion, not elsewhere classified: Secondary | ICD-10-CM

## 2021-06-13 ENCOUNTER — Telehealth: Payer: Self-pay | Admitting: Interventional Cardiology

## 2021-06-13 ENCOUNTER — Ambulatory Visit: Payer: Self-pay | Admitting: Thoracic Surgery (Cardiothoracic Vascular Surgery)

## 2021-06-13 ENCOUNTER — Other Ambulatory Visit: Payer: Self-pay

## 2021-06-13 VITALS — BP 150/76 | HR 90 | Resp 20 | Ht 62.0 in | Wt 128.0 lb

## 2021-06-13 DIAGNOSIS — J90 Pleural effusion, not elsewhere classified: Secondary | ICD-10-CM

## 2021-06-13 DIAGNOSIS — Z951 Presence of aortocoronary bypass graft: Secondary | ICD-10-CM

## 2021-06-13 NOTE — Telephone Encounter (Signed)
Pt aware forwarding to Dr. Eldridge Dace for advismeent. Reports nausea since starting Amiodarone. She has not taken it in 2 days and can eat again and not having further nausea. Has been compliant with Eliquis & Lopressor. Reports that she is having Afib since stopping Amiodarone.  HRs 70s this morning but up to 110s-120s by this afternoon. Not symptomatic at this time.  She did have some  light headedness this morning. No CP, SOB, edema.  Aware awaiting MD input and office will call once advised on. Patient verbalized understanding and agreeable to plan.

## 2021-06-13 NOTE — Telephone Encounter (Signed)
Spoke with patient and reviewed Dr Hoyle Barr orders as below: to Baird Lyons, RN  Dossie Arbour, RN  [facility] Triage     4:54 PM Can take an extra metoprolol 25 mg dose if she feels palpitations, in addition to the usual 50 mg dose.  COntinue to hold AMio.  COntinue blood thinner.   JV   Pt states understanding.  She will continue to monitor HR/BP at home and call back if any further questions or concerns.

## 2021-06-13 NOTE — Telephone Encounter (Signed)
Pt c/o medication issue:  1. Name of Medication: amiodarone (PACERONE) 200 MG tablet  2. How are you currently taking this medication (dosage and times per day)? Not taking  3. Are you having a reaction (difficulty breathing--STAT)? no  4. What is your medication issue? Patient states she stopped the medication, because it upsets her stomach. She states she is losing weight from not being able to eat.

## 2021-06-13 NOTE — Telephone Encounter (Signed)
There is > 10% reported incidence of nausea and vomiting with amiodarone. Will need to defer to MD regarding potential med change.

## 2021-06-13 NOTE — Progress Notes (Signed)
[address]       [name] [address]             [phone]     HPI: Kimberly Calhoun returns for follow-up of her left pleural effusion  Kimberly Calhoun is an 80 year old woman with a history of coronary artery disease, non-ST elevation MI, CABG x3 (Dr. Vickey Sages) July 2022, postoperative atrial fibrillation, stage III chronic kidney disease, hyperlipidemia, osteopenia, arthritis, and asthma.  As noted she had coronary bypass grafting x3 back in July.  She developed a left pleural effusion.  She has had thoracentesis done on 3 occasions.  She did have symptomatic improvement with the first thoracentesis, but the effusion quickly returned.  On 05/09/2019 to she had a second thoracentesis which drained about 600 mL of fluid.  She was readmitted on 05/13/2021 when she presented with A. fib with RVR.  She converted back to sinus rhythm.  She had another thoracentesis on 06/07/2021 which showed a pneumo ex vacuo.  She now referred back for consideration of surgical intervention for trapped lung.  She is accompanied by her daughter.  She says she is not really having much shortness of breath her daughter indicates is a bigger problem than she lets on.  She does complain of nausea from amiodarone.  She has seen multiple different doctors and  APPs and has not had anybody doing overall coordination of her care.  They are very frustrated.  She had a CT yesterday and was scheduled to see me as a follow-up visit.  Past Medical History:  Diagnosis Date   Arthritis    Asthma    CAD (coronary artery disease)    s/p CABG   Chronic kidney disease, stage 3 (HCC)    Dyslipidemia 05/13/2021   Headache    Hypercholesteremia    Hyperlipidemia    Osteopenia    Personal history of colonic polyps    Pleural effusion    recurrent    Current Outpatient Medications  Medication Sig Dispense Refill   acetaminophen (TYLENOL) 500 MG tablet Take 500 mg by mouth every 6 (six) hours as needed for  moderate pain or headache.     albuterol (VENTOLIN HFA) 108 (90 Base) MCG/ACT inhaler Inhale 1 puff into the lungs every 6 (six) hours as needed for shortness of breath.     amiodarone (PACERONE) 200 MG tablet Take 0.5 tablets (100 mg total) by mouth daily. 15 tablet 6   apixaban (ELIQUIS) 5 MG TABS tablet Take 1 tablet (5 mg total) by mouth 2 (two) times daily. 60 tablet 2   aspirin 81 MG EC tablet Take 81 mg by mouth daily. Swallow whole.     CALCIUM PO Take 1 tablet by mouth at bedtime as needed (For bones).     cetirizine (ZYRTEC) 10 MG tablet Take 10 mg by mouth daily.     Cholecalciferol (VITAMIN D PO) Take 1 tablet by mouth every morning.     ezetimibe (ZETIA) 10 MG tablet Take 1 tablet (10 mg total) by mouth daily. 30 tablet 11   fluticasone (FLONASE) 50 MCG/ACT nasal spray USE 2 SPRAYS IN EACH NOSTRIL DAILY FOR CLEAR RUNNY NOSE 48 g 4   furosemide (LASIX) 40 MG tablet Take 1 tablet (40 mg total) by mouth daily. 5 tablet 0   metoprolol tartrate (LOPRESSOR) 25 MG tablet Take 2 tablets (50 mg total) by mouth 2 (two) times daily. 120 tablet 0   Multiple Vitamin (MULTIVITAMIN WITH MINERALS) TABS  tablet Take 1 tablet by mouth at bedtime. Centrum silver.     potassium chloride 20 MEQ TBCR Take 20 mEq by mouth daily for 5 days. 5 tablet 0   Propylene Glycol (SYSTANE BALANCE OP) Apply 1 drop to eye daily as needed (dry eyes.).     vitamin C (ASCORBIC ACID) 500 MG tablet Take 500 mg by mouth every morning.     vitamin E 180 MG (400 UNITS) capsule Take 1 capsule (400 Units total) by mouth every morning. (Patient taking differently: Take 400 Units by mouth daily.)     zinc gluconate 50 MG tablet Take 50 mg by mouth daily.     No current facility-administered medications for this visit.    Physical Exam BP (!) 150/76   Pulse 90   Resp 20   Ht 5\' 2"  (1.575 m)   Wt 128 lb (58.1 kg)   SpO2 93% Comment: RA  BMI 23.65 kg/m  80 year old woman in no acute distress but obviously frustrated Alert  and oriented x3 with no focal deficits  Diagnostic Tests: CT CHEST WITHOUT CONTRAST   TECHNIQUE: Multidetector CT imaging of the chest was performed following the standard protocol without IV contrast.   COMPARISON:  CT chest 03/23/2021 and chest radiograph from 06/08/2021.   FINDINGS: Cardiovascular: Postop change from median sternotomy and CABG procedure.   There is a normal heart size.  Aortic atherosclerosis.   Mediastinum/Nodes: No enlarged mediastinal or axillary lymph nodes. Thyroid gland, trachea, and esophagus demonstrate no significant findings.   Lungs/Pleura: Moderate loculated left-sided hydropneumothorax is again identified. Compared with 06/08/2021 there is increasing fluid component which now extends up to the level of the superior segment of the left lower lobe, image 110/6. The upper lobe pneumothorax component appears similar in volume to the chest radiograph from 06/08/2021 with a distance of 2.2 cm from the tip of the left upper lung to the apical pleural surface. This is compared with 2.9 cm previously. The basilar component appears slightly increased with a distance of 4.3 cm from the lung to the lateral margin of the chest wall. This is compared with 3 cm previously. The right lung appears clear. Perifissural nodule along the minor fissure measures 6 mm.   Upper Abdomen: No acute abnormality. Small low-density structure in the periphery of the right hepatic lobe measures 6 mm and is too small to characterize.   Musculoskeletal: No chest wall mass or suspicious bone lesions identified.   IMPRESSION: 1. Moderate loculated left-sided hydropneumothorax is again identified. Compared with 06/08/2021 there is increasing fluid component which now extends up to the level of the superior segment of left lower lobe. The basilar pneumothorax component appears increased in volume compared with. 06/08/2021. 2. Aortic Atherosclerosis (ICD10-I70.0).  Status post  CABG.     Electronically Signed   By: 06/10/2021 M.D.   On: 06/12/2021 16:42 I personally reviewed the CT images.  There is a hydropneumothorax on the left.  Impression: Kimberly Calhoun is an 80 year old woman with a history of coronary artery disease, non-ST elevation MI, CABG x3 (Dr. 94) July 2022, postoperative atrial fibrillation, stage III chronic kidney disease, hyperlipidemia, osteopenia, arthritis, and asthma.  She developed a post CABG left pleural effusion.  She has now had 3 thoracenteses.  She had a pneumo ex vacuo after her most recent thoracentesis.  CT of the chest shows a complex left hydropneumothorax.  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  I advised Kimberly Calhoun and her daughter that I thought  surgical intervention would be necessary.  The procedure would be a left VATS for decortication to reexpand the lung.  She is not interested in having surgery.  She says she was told that this would resolve on its own with time and wants to wait.  She had questions about stopping amiodarone.  Originally had thought that seemed reasonable given the interval since her surgery, but she was readmitted about a month ago with atrial fibrillation.  They should check with Dr. Hoyle Barr office about whether that medication could be stopped or changed to a different one.  Plan: She will return in 2 weeks with a PA and lateral chest x-ray  Loreli Slot, MD [facility] [phone]

## 2021-06-15 ENCOUNTER — Emergency Department (HOSPITAL_BASED_OUTPATIENT_CLINIC_OR_DEPARTMENT_OTHER): Payer: Medicare Other | Admitting: Radiology

## 2021-06-15 ENCOUNTER — Other Ambulatory Visit: Payer: Self-pay

## 2021-06-15 ENCOUNTER — Emergency Department (HOSPITAL_BASED_OUTPATIENT_CLINIC_OR_DEPARTMENT_OTHER)
Admission: EM | Admit: 2021-06-15 | Discharge: 2021-06-15 | Disposition: A | Discharge status: Home / Self Care | Payer: Medicare Other | Attending: Emergency Medicine | Admitting: Emergency Medicine

## 2021-06-15 ENCOUNTER — Encounter (HOSPITAL_BASED_OUTPATIENT_CLINIC_OR_DEPARTMENT_OTHER): Payer: Self-pay

## 2021-06-15 DIAGNOSIS — N183 Chronic kidney disease, stage 3 unspecified: Secondary | ICD-10-CM | POA: Insufficient documentation

## 2021-06-15 DIAGNOSIS — Z7982 Long term (current) use of aspirin: Secondary | ICD-10-CM | POA: Diagnosis not present

## 2021-06-15 DIAGNOSIS — R002 Palpitations: Secondary | ICD-10-CM | POA: Diagnosis present

## 2021-06-15 DIAGNOSIS — I480 Paroxysmal atrial fibrillation: Secondary | ICD-10-CM | POA: Insufficient documentation

## 2021-06-15 DIAGNOSIS — J45909 Unspecified asthma, uncomplicated: Secondary | ICD-10-CM | POA: Insufficient documentation

## 2021-06-15 DIAGNOSIS — Z7901 Long term (current) use of anticoagulants: Secondary | ICD-10-CM | POA: Insufficient documentation

## 2021-06-15 DIAGNOSIS — I2510 Atherosclerotic heart disease of native coronary artery without angina pectoris: Secondary | ICD-10-CM | POA: Insufficient documentation

## 2021-06-15 DIAGNOSIS — Z951 Presence of aortocoronary bypass graft: Secondary | ICD-10-CM | POA: Insufficient documentation

## 2021-06-15 LAB — BASIC METABOLIC PANEL
Anion gap: 9 (ref 5–15)
BUN: 24 mg/dL — ABNORMAL HIGH (ref 8–23)
CO2: 24 mmol/L (ref 22–32)
Calcium: 9.2 mg/dL (ref 8.9–10.3)
Chloride: 101 mmol/L (ref 98–111)
Creatinine, Ser: 0.88 mg/dL (ref 0.44–1.00)
GFR, Estimated: >60 mL/min (ref >60)
Glucose, Bld: 98 mg/dL (ref 70–99)
Potassium: 3.9 mmol/L (ref 3.5–5.1)
Sodium: 134 mmol/L — ABNORMAL LOW (ref 135–145)

## 2021-06-15 LAB — CBC
HCT: 33.3 % — ABNORMAL LOW (ref 36.0–46.0)
Hemoglobin: 10.9 g/dL — ABNORMAL LOW (ref 12.0–15.0)
MCH: 28.8 pg (ref 26.0–34.0)
MCHC: 32.7 g/dL (ref 30.0–36.0)
MCV: 87.9 fL (ref 80.0–100.0)
Platelets: 577 10*3/uL — ABNORMAL HIGH (ref 150–400)
RBC: 3.79 MIL/uL — ABNORMAL LOW (ref 3.87–5.11)
RDW: 14.8 % (ref 11.5–15.5)
WBC: 12.5 10*3/uL — ABNORMAL HIGH (ref 4.0–10.5)
nRBC: 0 % (ref 0.0–0.2)

## 2021-06-15 LAB — TROPONIN I (HIGH SENSITIVITY)
Troponin I (High Sensitivity): 7 ng/L (ref <18)
Troponin I (High Sensitivity): 7 ng/L (ref <18)

## 2021-06-15 NOTE — ED Provider Notes (Signed)
[facility] EMERGENCY DEPT Provider Note   CSN: [id number] Arrival date & time: 06/15/21  0701     History Chief Complaint  Patient presents with   Shortness of Breath    Kimberly Calhoun is an 80 y.o. female with PMHx of CAD s/p CABG in July, post-op a-fib, recurrent left pleural effusion, and CKD stage 3 who presents with palpitations and shortness of breath.  Patient reports her "a-fib is acting up". States she awoke twice last night due to palpitations and her home pulse ox alerted her she was in a-fib. HR up to 157 at home. She endorses associated lightheadedness intermittently and midsternal chest heaviness. Also has shortness of breath, although states it is unchanged from prior. On Eliquis and Metoprolol at home. Previously on amiodarone but she couldn't tolerate due to nausea/vomiting, so it was discontinued 1 week ago. She was instead recommended to take an extra 25mg  metoprolol prn for palpitations. No fever, chills, cough, leg swelling, syncope, diaphoresis, or vomiting.    Past Medical History:  Diagnosis Date   Arthritis    Asthma    CAD (coronary artery disease)    s/p CABG   Chronic kidney disease, stage 3 (HCC)    Dyslipidemia 05/13/2021   Headache    Hypercholesteremia    Hyperlipidemia    Osteopenia    Personal history of colonic polyps    Pleural effusion    recurrent    Patient Active Problem List   Diagnosis Date Noted   Vasomotor rhinitis 05/18/2021   Shortness of breath 05/18/2021   Atrial fibrillation with RVR (HCC) 05/13/2021   Dyslipidemia 05/13/2021   Pleural effusion, left 05/08/2021   Paroxysmal atrial fibrillation (HCC) 04/11/2021   Secondary hypercoagulable state (HCC) 04/11/2021   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/2021   Coronary artery disease 03/24/2021   NSTEMI (non-ST elevated myocardial infarction) (HCC)    Chest pain 03/21/2021    Past Surgical History:  Procedure Laterality Date   ABDOMINAL HYSTERECTOMY  02/2013   COLONOSCOPY  WITH PROPOFOL N/A 12/07/2014   Procedure: COLONOSCOPY WITH PROPOFOL;  Surgeon: 12/09/2014, MD;  Location: [facility];  Service: Endoscopy;  Laterality: N/A;  ultraslim   CORONARY ARTERY BYPASS GRAFT N/A 03/24/2021   Procedure: CORONARY ARTERY BYPASS GRAFTING (CABG) TIMES THREE USING LEFT INTERNAL MAMMARY ARTERY AND RIGHT GREATER SAPHENOUS VEIN HARVESTED ENDOSCOPICALLY;  Surgeon: 05/25/2021, MD;  Location: [facility] OR;  Service: Open Heart Surgery;  Laterality: N/A;   IR THORACENTESIS ASP PLEURAL SPACE W/IMG GUIDE  04/17/2021   LEFT HEART CATH AND CORONARY ANGIOGRAPHY N/A 03/22/2021   Procedure: LEFT HEART CATH AND CORONARY ANGIOGRAPHY;  Surgeon: 05/23/2021, MD;  Location: [facility] INVASIVE CV LAB;  Service: Cardiovascular;  Laterality: N/A;   TEE WITHOUT CARDIOVERSION N/A 03/24/2021   Procedure: TRANSESOPHAGEAL ECHOCARDIOGRAM (TEE);  Surgeon: 05/25/2021, MD;  Location: [facility] OR;  Service: Open Heart Surgery;  Laterality: N/A;     OB History   No obstetric history on file.     Family History  Problem Relation Age of Onset   Heart disease Father    Atrial fibrillation Sister    Rheum arthritis Sister    Diabetes Brother    Breast cancer Neg Hx     Social History   Tobacco Use   Smoking status: Never   Smokeless tobacco: Never  Vaping Use   Vaping Use: Never used  Substance Use Topics   Alcohol use: No   Drug use: No  Home Medications Prior to Admission medications   Medication Sig Start Date End Date Taking? Authorizing Provider  apixaban (ELIQUIS) 5 MG TABS tablet Take 1 tablet (5 mg total) by mouth 2 (two) times daily. 06/08/21  Yes Corky Crafts, MD  aspirin 81 MG EC tablet Take 81 mg by mouth daily. Swallow whole.   Yes [provider]  CALCIUM PO Take 1 tablet by mouth at bedtime as needed (For bones).   Yes [provider]  ezetimibe (ZETIA) 10 MG tablet Take 1 tablet (10 mg total) by mouth daily. 06/05/21  Yes Corky Crafts, MD   vitamin C (ASCORBIC ACID) 500 MG tablet Take 500 mg by mouth every morning.   Yes [provider]  vitamin E 180 MG (400 UNITS) capsule Take 1 capsule (400 Units total) by mouth every morning. Patient taking differently: Take 400 Units by mouth daily. 04/24/21  Yes Doree Fudge M, PA-C  zinc gluconate 50 MG tablet Take 50 mg by mouth daily.   Yes [provider]  acetaminophen (TYLENOL) 500 MG tablet Take 500 mg by mouth every 6 (six) hours as needed for moderate pain or headache.    [provider]  albuterol (VENTOLIN HFA) 108 (90 Base) MCG/ACT inhaler Inhale 1 puff into the lungs every 6 (six) hours as needed for shortness of breath. 12/05/20   [provider]  amiodarone (PACERONE) 200 MG tablet Take 0.5 tablets (100 mg total) by mouth daily. Patient not taking: Reported on 06/15/2021 04/19/21   Manson Passey, PA  cetirizine (ZYRTEC) 10 MG tablet Take 10 mg by mouth daily.    [provider]  Cholecalciferol (VITAMIN D PO) Take 1 tablet by mouth every morning.    [provider]  fluticasone (FLONASE) 50 MCG/ACT nasal spray USE 2 SPRAYS IN EACH NOSTRIL DAILY FOR CLEAR RUNNY NOSE 05/18/21   Tonny Bollman, MD  furosemide (LASIX) 40 MG tablet Take 1 tablet (40 mg total) by mouth daily. Patient not taking: Reported on 06/15/2021 06/08/21   Sharlene Dory, PA-C  metoprolol tartrate (LOPRESSOR) 25 MG tablet Take 2 tablets (50 mg total) by mouth 2 (two) times daily. 05/14/21 06/13/21  Zigmund Daniel., MD  Multiple Vitamin (MULTIVITAMIN WITH MINERALS) TABS tablet Take 1 tablet by mouth at bedtime. Centrum silver.    [provider]  potassium chloride 20 MEQ TBCR Take 20 mEq by mouth daily for 5 days. 06/08/21 06/13/21  Sharlene Dory, PA-C  Propylene Glycol (SYSTANE BALANCE OP) Apply 1 drop to eye daily as needed (dry eyes.).    [provider]    Allergies    Macrobid [nitrofurantoin], Mold extract [trichophyton],  Statins, and Other  Review of Systems   Review of Systems  Constitutional:  Negative for chills and fever.  Respiratory:  Positive for shortness of breath. Negative for cough.   Cardiovascular:  Positive for chest pain and palpitations. Negative for leg swelling.  Gastrointestinal:  Negative for abdominal pain and vomiting.  Skin:  Negative for rash.  Neurological:  Positive for dizziness. Negative for syncope.   Physical Exam Updated Vital Signs BP 135/70 (BP Location: Right Arm)   Pulse (!) 112   Temp 97.9 F (36.6 C) (Oral)   Resp (!) 23   Ht 5\' 2"  (1.575 m)   Wt 58.1 kg   SpO2 96%   BMI 23.43 kg/m   Physical Exam Constitutional:      General: She is not in acute distress.  Appearance: She is not toxic-appearing.  HENT:     Head: Normocephalic and atraumatic.     Mouth/Throat:     Mouth: Mucous membranes are moist.  Cardiovascular:     Rate and Rhythm: Normal rate. Rhythm irregular.     Heart sounds: Normal heart sounds.  Pulmonary:     Effort: Pulmonary effort is normal.     Breath sounds: Examination of the left-lower field reveals rales. Rales present.  Abdominal:     Palpations: Abdomen is soft.     Tenderness: There is no abdominal tenderness.  Musculoskeletal:     Right lower leg: No edema.     Left lower leg: No edema.  Skin:    General: Skin is warm and dry.     Capillary Refill: Capillary refill takes less than 2 seconds.  Neurological:     General: No focal deficit present.     Mental Status: She is alert.    ED Results / Procedures / Treatments   Labs (all labs ordered are listed, but only abnormal results are displayed) Labs Reviewed  BASIC METABOLIC PANEL - Abnormal; Notable for the following components:      Result Value   Sodium 134 (*)    BUN 24 (*)    All other components within normal limits  CBC - Abnormal; Notable for the following components:   WBC 12.5 (*)    RBC 3.79 (*)    Hemoglobin 10.9 (*)    HCT 33.3 (*)    Platelets  577 (*)    All other components within normal limits  TROPONIN I (HIGH SENSITIVITY)  TROPONIN I (HIGH SENSITIVITY)    EKG None  Radiology DG Chest 2 View  Result Date: 06/15/2021 CLINICAL DATA:  Shortness of breath, history of atrial fibrillation and fluid accumulation on the lungs. EXAM: CHEST - 2 VIEW COMPARISON:  CT June 12, 2021 and chest x-ray from June 08, 2021. FINDINGS: Lung show increased aeration since previous imaging particularly RIGHT chest with flattening of the RIGHT hemidiaphragm. Persistent LEFT-sided hydropneumothorax with apical and basilar component. Size slightly diminished accounting for increased inflation in the chest with a 1.7 cm distance from the RIGHT lung apex as compared to 3.1 cm. Pleural fluid is likely slightly increased since the previous study. Median sternotomy changes are noted. Cardiomediastinal contours and hilar structures are stable. LEFT basilar airspace disease persists. EKG leads project over the chest. On limited assessment there is no acute skeletal process. IMPRESSION: Persistent LEFT-sided hydropneumothorax with apical and basilar component. Pneumothorax component may be slightly smaller accounting for hyperinflation with suggestion of slightly increased pleural fluid in the LEFT lung base. Persistent LEFT basilar airspace disease. Electronically Signed   By: Donzetta Kohut M.D.   On: 06/15/2021 08:32    Procedures Procedures   Medications Ordered in ED Medications - No data to display  ED Course  I have reviewed the triage vital signs and the nursing notes.  Pertinent labs & imaging results that were available during my care of the patient were reviewed by me and considered in my medical decision making (see chart for details).    MDM Rules/Calculators/A&P                         This is an 80 year old female with hx of CAD s/p CABG in July, post-op afib, recurrent left pleural effusion and CKD 3 presenting with  palpitations.  Patient with several complications since her CABG in July including  recurrent L pleural effusion s/p thoracentesis x3 and paroxysmal a-fib. She has been followed closely by cardiology and cardiothoracic surgery. Last seen by CT surgery 2 days ago, who recommended VATS but patient preferred to hold off on surgery at the time.  Today presents with symptomatic a-fib, with rates up to 157 at home. Here she is noted to be in atrial fibrillation on EKG, rates ranging from 70s-90s, normal BP, mentating appropriately, minimal symptoms at the present time.   Will obtain basic cardiac workup including CBC, BMP, troponin, and CXR. Suspect her a-fib is related to her recurrent L pleural effusion. Also considered electrolyte derangement, less likely thyroid abnormality (normal TSH 1 month ago), less likely ACS or infectious process.  Workup largely unremarkable. CXR with persistence of L pleural effusion and hydropneumothorax, but essentially unchanged from prior. She remains in a-fib with HR in the 70s-80s and normal BP. Discussed case with on-call cardiologist, Dr. Jacques Navy, who agrees patient is stable for discharge home and outpatient cardiology follow up. Will increase Metoprolol to 75mg  BID for improved symptomatic relief. Patient agreeable with plan.  Final Clinical Impression(s) / ED Diagnoses Final diagnoses:  Paroxysmal atrial fibrillation [facility])    Rx / DC Orders ED Discharge Orders     None        [facility], MD 06/15/21 1226    06/17/21, MD 06/17/21 2313

## 2021-06-15 NOTE — ED Notes (Signed)
ED Provider at bedside. 

## 2021-06-15 NOTE — Discharge Instructions (Addendum)
You were seen in the Emergency Department for atrial fibrillation. Your chest x-ray showed fluid in between your lung and chest wall, which was unchanged from your last x-ray. This could be contributing to your atrial fibrillation. You should increase your Metoprolol to 75mg  (3 tablets) twice daily to help with your symptoms. If you start experiencing worsening dizziness/lightheadedness or other symptoms, please reach out to your cardiologist.

## 2021-06-15 NOTE — ED Triage Notes (Signed)
Pt arrives POV.  States she has a history of a-fib and it has been "acting up" over the last week.  States she can feel her heart beating faster at times.  States she also has a left "lung issue".  States she has being feeling short of breath.   Denies pain but feels like she has a "weight" on her chest.

## 2021-06-19 NOTE — Progress Notes (Addendum)
Cardiology Office Note:    Date:  06/20/2021   ID:  DENIAH SAIA, DOB 03/23/1941, MRN [id number]  PCP:  Ileana Ladd, MD   [facility] Providers Cardiologist:  Lance Muss, MD     Referring MD: Ileana Ladd, MD   Chief Complaint:  Hospitalization Follow-up (ED visit with recurrent atrial fibrillation)    Patient Profile:   CABRIA MICALIZZI is a 80 y.o. female with:  Coronary artery disease  NSTEMI S/p CABG in 7/22 Post op AFib>>Amiodarone; Apixaban  Amio DC'd due to GI SE's Pleural effusion >> s/p thoracentesis x 3  Tap 06/07/21 >> c/b pneumo ex vacuo CT: complex L hydropneumothorax; concerning for trapped lung  Recurrent post CABG L pleural effusion Persistent atrial fibrillation  Chronic kidney disease  Hyperlipidemia  Intol of statins  Asthma  ?Hypertrophic CM Pre-CABG echocardiogram in 7/22: asymmetric LVH with LVOT 43 mmHg w Valsalva  Echocardiogram 8/22 (after CABG): no LVH  Prior CV studies: Echocardiogram 05/14/21 EF 65-70, no RWMA, GR 1 DD, mildly reduced RVSF, RVSP 27.4, large left pleural effusion, mild AV sclerosis without stenosis  Pre-CABG Doppler 03/23/21 Bilateral ICA 1-39  LEFT HEART CATH AND CORONARY ANGIOGRAPHY 03/22/2021 Narrative The left ventricular ejection fraction is greater than 65% by visual estimate. Severe multivessel CAD with significant calcification in the proximal LAD with 40% smooth proximal stenosis followed by 90% stenosis with irregularity in the region of the first diagonal takeoff.  The LAD is subtotally occluded after a very tortuous large second diagonal vessel. 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 Large dominant RCA with 30% proximal stenosis followed by 50 and 80% eccentric mid stenoses with 30% stenosis before the PDA takeoff and 80% stenosis in the PDA.  The distal RCA supplies 4 large branches which extend to the LV apex. Probable hypertrophic  cardiomyopathy with near cavity obliteration below the aortic valve with a peak to peak gradient of at least 25 mmHg.   Echocardiogram 03/22/21 LVOT gradient 43 mmHg with Valsalva, EF 60-65, moderate asymmetric LVH, anteroseptal and inferoseptal HK, normal RVSF, mild MR  NM Myocar Multi W/Spect W/Wall Motion / EF 03/22/2021 IMPRESSION: 1. Large area of reversible ischemia involving most of the septum, distal segments of all walls and the left ventricular apex as described. 2. Abnormal left ventricular wall motion with apical hypokinesis and paradoxical motion in the septum. 3. Left ventricular ejection fraction 59% 4. Non invasive risk stratification*: High    History of Present Illness: Ms. Sherrill has had a complex course since her NSTEMI followed by CABG in 7/22.  She has undergone thoracentesis x 3 for recurrent L pleural effusion.  She was also readmitted in 8/22 with AF w RVR.  She has ultimately had Amiodarone DCd due to nausea.  She was last seen in our office on 9/14 with Dr. Eldridge Dace.  Since then, she underwent her 3rd thoracentesis with Dr. Tonia Brooms in the Pulmonology office.  She developed post thoracentesis pneumothorax.  She was seen by Dr. Dorris Fetch 06/13/21.  A f/u CT showed complex L hydropneumothorax and findings concerning for trapped lung.  L VATS with decortication to reexpand her lung was recommended.  However, the patient declined surgery.  She has f/u with Dr. Dorris Fetch with a f/u CXR on 06/27/21.  She did go to the ED 06/15/21 with AF w RVR.  Her Metoprolol dose was increased to 75 mg twice daily.  CXR demonstrated persistent L hydropneumothorax.  She returns for f/u after her recent ED visit.  She is here alone.  She has not had any further atrial fibrillation since she left the emergency room.  She has not had significant chest discomfort other than when she is in atrial fibrillation.  She has chronic shortness of breath with exertion related to her effusion.  This is  unchanged.  She has not had orthopnea, leg edema, syncope.    Past Medical History:  Diagnosis Date   Arthritis    Asthma    CAD (coronary artery disease)    s/p CABG   Chronic kidney disease, stage 3 (HCC)    Dyslipidemia 05/13/2021   Headache    Hypercholesteremia    Hyperlipidemia    Osteopenia    Personal history of colonic polyps    Pleural effusion    recurrent   Current Medications: Current Meds  Medication Sig   acetaminophen (TYLENOL) 500 MG tablet Take 500 mg by mouth every 6 (six) hours as needed for moderate pain or headache.   albuterol (VENTOLIN HFA) 108 (90 Base) MCG/ACT inhaler Inhale 1 puff into the lungs every 6 (six) hours as needed for shortness of breath.   apixaban (ELIQUIS) 5 MG TABS tablet Take 1 tablet (5 mg total) by mouth 2 (two) times daily.   aspirin 81 MG EC tablet Take 81 mg by mouth daily. Swallow whole.   CALCIUM PO Take 1 tablet by mouth at bedtime as needed (For bones).   cetirizine (ZYRTEC) 10 MG tablet Take 10 mg by mouth daily.   Cholecalciferol (VITAMIN D PO) Take 1 tablet by mouth every morning.   ezetimibe (ZETIA) 10 MG tablet Take 1 tablet (10 mg total) by mouth daily.   fluticasone (FLONASE) 50 MCG/ACT nasal spray USE 2 SPRAYS IN EACH NOSTRIL DAILY FOR CLEAR RUNNY NOSE   metoprolol tartrate (LOPRESSOR) 25 MG tablet Take 3 tablets by mouth 2 (two) times daily   Multiple Vitamin (MULTIVITAMIN WITH MINERALS) TABS tablet Take 1 tablet by mouth at bedtime. Centrum silver.   Propylene Glycol (SYSTANE BALANCE OP) Apply 1 drop to eye daily as needed (dry eyes.).   vitamin C (ASCORBIC ACID) 500 MG tablet Take 500 mg by mouth every morning.   vitamin E 180 MG (400 UNITS) capsule Take 1 capsule (400 Units total) by mouth every morning.   zinc gluconate 50 MG tablet Take 50 mg by mouth daily.    Allergies:   Macrobid [nitrofurantoin], Mold extract [trichophyton], Statins, and Other   Social History   Tobacco Use   Smoking status: Never    Smokeless tobacco: Never  Vaping Use   Vaping Use: Never used  Substance Use Topics   Alcohol use: No   Drug use: No    Family Hx: The patient's family history includes Atrial fibrillation in her sister; Diabetes in her brother; Heart disease in her father; Rheum arthritis in her sister. There is no history of Breast cancer.  Review of Systems  HENT:  Positive for nosebleeds.   Gastrointestinal:  Negative for hematochezia.  Genitourinary:  Negative for hematuria.    EKGs/Labs/Other Test Reviewed:    EKG:  EKG is   ordered today.  The ekg ordered today demonstrates NSR, HR 71, normal axis, no ST-T wave changes, anteroseptal Q waves, QTC 423  Recent Labs: 05/13/2021: ALT 22; B Natriuretic Peptide 377.0; Magnesium 2.2; TSH 4.202 06/15/2021: BUN 24; Creatinine, Ser 0.88; Hemoglobin 10.9; Platelets 577; Potassium 3.9; Sodium 134   Recent Lipid Panel Lab  Results  Component Value Date/Time   CHOL 270 (H) 03/22/2021 01:49 AM   TRIG 140 03/22/2021 01:49 AM   HDL 62 03/22/2021 01:49 AM   LDLCALC 180 (H) 03/22/2021 01:49 AM     Risk Assessment/Calculations:    CHA2DS2-VASc Score = 5   This indicates a 7.2% annual risk of stroke. The patient's score is based upon: CHF History: 0 HTN History: 1 Diabetes History: 0 Stroke History: 0 Vascular Disease History: 1 Age Score: 2 Gender Score: 1         Physical Exam:    VS:  BP (!) 148/70   Pulse 71   Ht 5' 1.5" (1.562 m)   Wt 129 lb 12.8 oz (58.9 kg)   SpO2 92%   BMI 24.13 kg/m     Wt Readings from Last 3 Encounters:  06/20/21 129 lb 12.8 oz (58.9 kg)  06/15/21 128 lb 1.4 oz (58.1 kg)  06/13/21 128 lb (58.1 kg)    Constitutional:      Appearance: Healthy appearance. Not in distress.  Neck:     Vascular: JVD normal.  Pulmonary:     Effort: Pulmonary effort is normal.     Breath sounds: No wheezing. Rales (Decreased BS L base; +rales) present.  Cardiovascular:     Normal rate. Regular rhythm. Normal S1. Normal S2.       Murmurs: There is no murmur.  Edema:    Peripheral edema absent.  Abdominal:     Palpations: Abdomen is soft. There is no hepatomegaly.  Skin:    General: Skin is warm and dry.  Neurological:     Mental Status: Alert and oriented to person, place and time.     Cranial Nerves: Cranial nerves are intact.       ASSESSMENT & PLAN:   1. Persistent atrial fibrillation (HCC) Maintaining sinus rhythm.  She stopped amiodarone secondary to side effects.  She remains on metoprolol 75 mg twice daily.  We discussed increasing her dose to 100 mg twice daily but she prefers not to do this.  She is concerned the higher dose is causing some nausea.  Continue metoprolol tartrate 75 mg twice daily.  I advised her to take an extra metoprolol tartrate 25 mg as needed for recurrent atrial fibrillation.  2. Coronary artery disease involving coronary bypass graft of native heart with unstable angina pectoris (HCC) Status post CABG in 7/22.  She is not having anginal symptoms.  Continue aspirin 81 mg daily, ezetimibe 10 mg daily.  Follow-up with Dr. Eldridge Dace in 6 months.  3. Mixed hyperlipidemia She is intolerant to statins.  Continue ezetimibe 10 mg daily.  She has an evaluation with the lipid clinic upcoming to discuss PCSK9 inhibitor therapy.  4. HOCM (hypertrophic obstructive cardiomyopathy) (HCC) Elevated LVOT gradient by echocardiogram prior to bypass surgery.  Her most recent echocardiogram in August demonstrated no LVOT gradient.  5. Pleural effusion, left Managed by cardiothoracic surgery and pulmonology.  Status post thoracentesis x3.  This was complicated by pneumothorax.  Most recent chest x-ray demonstrated stable left hydropneumothorax.  There is concern for trapped lung.  She sees Dr. Dorris Fetch next week with a repeat chest x-ray.  L sided VATS with decortication has been recommended but she has declined in the past.    6. Hypertension Repeat blood pressure by me 130/62.  Her pressures at home  are usually more optimal.  Continue metoprolol 75 mg twice daily.     Dispo:  Return in about 5 months (around  11/18/2021) for Keep your scheduled follow up with Dr. Viona Gilmore. .   Medication Adjustments/Labs and Tests Ordered: Current medicines are reviewed at length with the patient today.  Concerns regarding medicines are outlined above.  Tests Ordered: Orders Placed This Encounter  Procedures   EKG 12-Lead   Medication Changes: No orders of the defined types were placed in this encounter.  Signed, Tereso Newcomer, PA-C  06/20/2021 11:11 AM    [facility] [address] [place], [place], [place]  [address] Phone: [phone]; Fax: [phone]

## 2021-06-20 ENCOUNTER — Other Ambulatory Visit: Payer: Self-pay

## 2021-06-20 ENCOUNTER — Ambulatory Visit: Payer: Medicare Other | Admitting: Physician Assistant

## 2021-06-20 ENCOUNTER — Encounter: Payer: Self-pay | Admitting: Physician Assistant

## 2021-06-20 VITALS — BP 148/70 | HR 71 | Ht 61.5 in | Wt 129.8 lb

## 2021-06-20 DIAGNOSIS — I4819 Other persistent atrial fibrillation: Secondary | ICD-10-CM | POA: Diagnosis not present

## 2021-06-20 DIAGNOSIS — I10 Essential (primary) hypertension: Secondary | ICD-10-CM

## 2021-06-20 DIAGNOSIS — E782 Mixed hyperlipidemia: Secondary | ICD-10-CM | POA: Diagnosis not present

## 2021-06-20 DIAGNOSIS — I421 Obstructive hypertrophic cardiomyopathy: Secondary | ICD-10-CM | POA: Diagnosis not present

## 2021-06-20 DIAGNOSIS — J90 Pleural effusion, not elsewhere classified: Secondary | ICD-10-CM

## 2021-06-20 DIAGNOSIS — I25700 Atherosclerosis of coronary artery bypass graft(s), unspecified, with unstable angina pectoris: Secondary | ICD-10-CM

## 2021-06-20 NOTE — Patient Instructions (Signed)
Medication Instructions:   Your physician recommends that you continue on your current medications as directed. Please refer to the Current Medication list given to you today.  *If you need a refill on your cardiac medications before your next appointment, please call your pharmacy*   Lab Work:  -NONE  If you have labs (blood work) drawn today and your tests are completely normal, you will receive your results only by: MyChart Message (if you have MyChart) OR A paper copy in the mail If you have any lab test that is abnormal or we need to change your treatment, we will call you to review the results.   Testing/Procedures:  -NONE   Follow-Up: At [facility], you and your health needs are our priority.  As part of our continuing mission to provide you with exceptional heart care, we have created designated Provider Care Teams.  These Care Teams include your primary Cardiologist (physician) and Advanced Practice Providers (APPs -  Physician Assistants and Nurse Practitioners) who all work together to provide you with the care you need, when you need it.  We recommend signing up for the patient portal called "MyChart".  Sign up information is provided on this After Visit Summary.  MyChart is used to connect with patients for Virtual Visits (Telemedicine).  Patients are able to view lab/test results, encounter notes, upcoming appointments, etc.  Non-urgent messages can be sent to your provider as well.   To learn more about what you can do with MyChart, go to [email or web address].    Your next appointment:   5 month(s)  The format for your next appointment:   In Person  Provider:   Everette Rank, MD   Other Instructions

## 2021-06-22 ENCOUNTER — Ambulatory Visit: Payer: Medicare Other

## 2021-06-26 ENCOUNTER — Ambulatory Visit: Payer: Medicare Other | Admitting: Pulmonary Disease

## 2021-06-26 ENCOUNTER — Other Ambulatory Visit: Payer: Self-pay | Admitting: *Deleted

## 2021-06-26 ENCOUNTER — Other Ambulatory Visit: Payer: Self-pay

## 2021-06-26 DIAGNOSIS — Z951 Presence of aortocoronary bypass graft: Secondary | ICD-10-CM

## 2021-06-26 DIAGNOSIS — J90 Pleural effusion, not elsewhere classified: Secondary | ICD-10-CM

## 2021-06-26 MED ORDER — METOPROLOL TARTRATE 25 MG PO TABS: 75.0000 mg | ORAL_TABLET | Freq: Two times a day (BID) | ORAL | 3 refills | Status: DC

## 2021-06-26 NOTE — Telephone Encounter (Signed)
Pt's medication was sent to pt's pharmacy as requested. Confirmation received.  °

## 2021-06-27 ENCOUNTER — Ambulatory Visit
Admission: RE | Admit: 2021-06-27 | Discharge: 2021-06-27 | Disposition: A | Discharge status: Home / Self Care | Payer: Medicare Other | Source: Ambulatory Visit | Attending: Thoracic Surgery (Cardiothoracic Vascular Surgery) | Admitting: Thoracic Surgery (Cardiothoracic Vascular Surgery)

## 2021-06-27 ENCOUNTER — Other Ambulatory Visit: Payer: Self-pay

## 2021-06-27 ENCOUNTER — Ambulatory Visit: Payer: Medicare Other | Admitting: Thoracic Surgery (Cardiothoracic Vascular Surgery)

## 2021-06-27 ENCOUNTER — Encounter: Payer: Self-pay | Admitting: Thoracic Surgery (Cardiothoracic Vascular Surgery)

## 2021-06-27 VITALS — BP 150/69 | HR 80 | Resp 20 | Ht 62.0 in | Wt 128.0 lb

## 2021-06-27 DIAGNOSIS — J90 Pleural effusion, not elsewhere classified: Secondary | ICD-10-CM

## 2021-06-27 DIAGNOSIS — Z951 Presence of aortocoronary bypass graft: Secondary | ICD-10-CM | POA: Diagnosis not present

## 2021-06-27 NOTE — Progress Notes (Signed)
[address]       [name] [address]             [phone]     HPI: Kimberly Calhoun returns for follow-up of her left pleural effusion.  Kimberly Calhoun is an 80 year old woman with a history of CAD, non-STEMI, CABG x3, postoperative atrial fibrillation, stage III chronic kidney disease, hyperlipidemia, osteopenia, arthritis, and asthma.  She had coronary bypass grafting x3 by Dr. Vickey Sages in July.  She developed a left pleural effusion.  She has had 3 thoracenteses.  She had symptomatic relief the first time.  The second time they did not drain as much fluid.  The third thoracentesis was done on 06/07/2021.  She had a pneumo ex vacuo afterwards.  A CT of the chest showed a complex loculated hydropneumothorax at the left base.  I saw her on 06/13/2021.  She said her breathing was not bad but her daughter said there was a bigger problem than she was admitted to.  Her biggest complaint was nausea from amiodarone.  She has stopped that medication now.  She is up to 75 mg of Lopressor twice daily.  I advised her to consider a left VATS for decortication to reexpand the left lung.  She did not want to do that.  She now returns for follow-up.  She says her breathing is markedly better than it was at her last visit.  She is surprised that there is any fluid left.  She attributes this to stopping amiodarone.  Past Medical History:  Diagnosis Date   Arthritis    Asthma    CAD (coronary artery disease)    s/p CABG   Chronic kidney disease, stage 3 (HCC)    Dyslipidemia 05/13/2021   Headache    Hypercholesteremia    Hyperlipidemia    Osteopenia    Personal history of colonic polyps    Pleural effusion    recurrent    Current Outpatient Medications  Medication Sig Dispense Refill   acetaminophen (TYLENOL) 500 MG tablet Take 500-1,000 mg by mouth every 6 (six) hours as needed for moderate pain or headache.     albuterol (VENTOLIN HFA) 108 (90 Base) MCG/ACT inhaler Inhale 1  puff into the lungs every 6 (six) hours as needed for shortness of breath.     apixaban (ELIQUIS) 5 MG TABS tablet Take 1 tablet (5 mg total) by mouth 2 (two) times daily. 60 tablet 2   aspirin 81 MG EC tablet Take 81 mg by mouth daily. Swallow whole.     CALCIUM PO Take 1 tablet by mouth daily.     cetirizine (ZYRTEC) 10 MG tablet Take 10 mg by mouth daily as needed for allergies.     Cholecalciferol (VITAMIN D PO) Take 1 tablet by mouth every morning.     ezetimibe (ZETIA) 10 MG tablet Take 1 tablet (10 mg total) by mouth daily. 30 tablet 11   fluticasone (FLONASE) 50 MCG/ACT nasal spray Place 1 spray into both nostrils daily as needed for allergies.     metoprolol tartrate (LOPRESSOR) 25 MG tablet Take 3 tablets (75 mg total) by mouth 2 (two) times daily. 540 tablet 3   Multiple Vitamin (MULTIVITAMIN WITH MINERALS) TABS tablet Take 1 tablet by mouth at bedtime. Centrum silver.     Propylene Glycol (SYSTANE BALANCE OP) Apply 1 drop to eye every 4 (four) hours as needed (dry eyes.).     vitamin C (ASCORBIC ACID) 500 MG tablet  Take 500 mg by mouth every morning.     vitamin E 180 MG (400 UNITS) capsule Take 1 capsule (400 Units total) by mouth every morning.     zinc gluconate 50 MG tablet Take 50 mg by mouth daily.     No current facility-administered medications for this visit.    Physical Exam BP (!) 150/69   Pulse 80   Resp 20   Ht 5\' 2"  (1.575 m)   Wt 128 lb (58.1 kg)   SpO2 94% Comment: RA  BMI 23.76 kg/m  80 year old woman in no acute distress Alert and oriented x3 with no focal deficits Lungs breath sounds absent at left base, otherwise clear  Diagnostic Tests: CHEST - 2 VIEW   COMPARISON:  06/15/2021   FINDINGS: Postop changes from median sternotomy. Normal heart size. Small to moderate left pleural effusion is identified. No right effusion. No interstitial edema or airspace consolidation.   IMPRESSION: Small to moderate left pleural effusion.     Electronically  Signed   By: 06/17/2021 M.D.   On: 06/27/2021 14:41 I personally reviewed the chest x-ray images.  There is a small to moderate left pleural effusion  Impression: Kimberly Calhoun is an 80 year old woman with a history of CAD, non-STEMI, CABG x3, postoperative atrial fibrillation, stage III chronic kidney disease, hyperlipidemia, osteopenia, arthritis, and asthma.  Recurrent left pleural effusion -post cardiac surgery.  Her shortness of breath is improved since her last visit.  Her chest x-ray really has not changed other than the basilar airspace is filling in with fluid.  There is not any apparent pneumothorax at the apex like there was on her CT.  We discussed the pleural effusion.  I do not think thoracentesis is going to be helpful given the loculation seen on CT.  The options are to do nothing, a trial of prednisone, or VATS for decortication.  We discussed the relative pros and cons of each of those approaches.  She understands that having a trapped lung does increase her risk for respiratory infections.  She will have some permanent decrease in her pulmonary reserve.  Currently she feels well and does not want to have any intervention.  She also refused an offer of a trial of prednisone.   Plan: Follow-up with Dr. 97 and Dr. Modesto Charon I will be happy to see her back anytime in the future should the pleural effusion worsen or any other issues with which I can assist  Eldridge Dace, MD [facility] [phone]

## 2021-06-28 ENCOUNTER — Ambulatory Visit: Payer: Medicare Other | Admitting: Pharmacist

## 2021-06-28 DIAGNOSIS — E785 Hyperlipidemia, unspecified: Secondary | ICD-10-CM | POA: Diagnosis not present

## 2021-06-28 MED ORDER — ROSUVASTATIN CALCIUM 5 MG PO TABS: 5.0000 mg | ORAL_TABLET | Freq: Every day | ORAL | 11 refills | Status: DC

## 2021-06-28 NOTE — Patient Instructions (Addendum)
It was nice to meet you  Your LDL cholesterol is 180 at baseline and your goal is < 70  Start rosuvastatin (Crestor) 5mg  - 1 tablet once daily  Continue taking your other medications  Call , PharmD with any trouble tolerating the rosuvastatin at [phone]  Otherwise, we'll plan to recheck your labs in 6 weeks - come in any time after 7:30am on Monday, November 28th for fasting lab work

## 2021-06-28 NOTE — Progress Notes (Addendum)
Patient ID: Kimberly Calhoun                 DOB: 1941/05/08                    MRN: [id number]     HPI: Kimberly Calhoun is a 80 y.o. female patient referred to lipid clinic by Dr Eldridge Dace. PMH is significant for unstable angina in 03/2021 which led to cath showing multivessel CAD for which she underwent CABG x3, afib, and HLD.  She was started on ezetimibe but baseline LDL is 180. Reports history of statin-intolerance and was referred to lipid clinic to discuss other options.  Pt presents today in good spirits. Recalls trying statins years ago. Unsure which statin or dose she tried. Reports leg pain almost immediately after starting. Doesn't think she tried another statin afterwards. Hesitant to try new medication as she felt poorly on amiodarone for months and is just now feeling back to her normal self.  Current Medications: ezetimibe 10mg  daily Intolerances: statins Risk Factors: CAD s/p CABG LDL goal: 70mg /dL  Diet:  Breakfast - boiled egg, OJ, fruit, bagel with cream cheese or cereal. Lunch - sandwich Dinner - meat and vegetable  Exercise: Walks frequently, starting cardiac rehab soon  Family History: Atrial fibrillation in her sister; Diabetes in her brother; Heart disease in her father; Rheum arthritis in her sister.   Social History: Denies tobacco, alcohol, and drug use.  Labs: 03/22/21: TC 270, TG 140, HDL 62, LDL 180 (no LLT)  Past Medical History:  Diagnosis Date   Arthritis    Asthma    CAD (coronary artery disease)    s/p CABG   Chronic kidney disease, stage 3 (HCC)    Dyslipidemia 05/13/2021   Headache    Hypercholesteremia    Hyperlipidemia    Osteopenia    Personal history of colonic polyps    Pleural effusion    recurrent    Current Outpatient Medications on File Prior to Visit  Medication Sig Dispense Refill   acetaminophen (TYLENOL) 500 MG tablet Take 500-1,000 mg by mouth every 6 (six) hours as needed for moderate pain or headache.     albuterol  (VENTOLIN HFA) 108 (90 Base) MCG/ACT inhaler Inhale 1 puff into the lungs every 6 (six) hours as needed for shortness of breath.     apixaban (ELIQUIS) 5 MG TABS tablet Take 1 tablet (5 mg total) by mouth 2 (two) times daily. 60 tablet 2   aspirin 81 MG EC tablet Take 81 mg by mouth daily. Swallow whole.     CALCIUM PO Take 1 tablet by mouth daily.     cetirizine (ZYRTEC) 10 MG tablet Take 10 mg by mouth daily as needed for allergies.     Cholecalciferol (VITAMIN D PO) Take 1 tablet by mouth every morning.     ezetimibe (ZETIA) 10 MG tablet Take 1 tablet (10 mg total) by mouth daily. 30 tablet 11   metoprolol tartrate (LOPRESSOR) 25 MG tablet Take 3 tablets (75 mg total) by mouth 2 (two) times daily. 540 tablet 3   Multiple Vitamin (MULTIVITAMIN WITH MINERALS) TABS tablet Take 1 tablet by mouth at bedtime. Centrum silver.     Propylene Glycol (SYSTANE BALANCE OP) Apply 1 drop to eye every 4 (four) hours as needed (dry eyes.).     vitamin C (ASCORBIC ACID) 500 MG tablet Take 500 mg by mouth every morning.     vitamin E 180 MG (400 UNITS) capsule  Take 1 capsule (400 Units total) by mouth every morning.     zinc gluconate 50 MG tablet Take 50 mg by mouth daily.     No current facility-administered medications on file prior to visit.    Allergies  Allergen Reactions   Amiodarone Nausea And Vomiting   Macrobid [Nitrofurantoin] Other (See Comments)    Cold chills, muscle weakness.   Mold Extract [Trichophyton] Other (See Comments)    Allergies.    Statins Other (See Comments)    Muscle pain   Other Other (See Comments)    Allergies.   Mold in Cheese - stuffy nose and headache  Chocolate- stuffy nose and headache  Mildew - stuffy nose and headache    Assessment/Plan:  1. Hyperlipidemia - Baseline LDL 180 on no LLT, above goal < 70 given recent CABG. She is tolerating ezetimibe well, but anticipate LDL still in the 140s. She is agreeable to statin rechallenge with rosuvastatin 5mg  daily  and will call with any tolerability issues. Otherwise, will recheck lipids in 6 weeks. Discussed PCSK9i as well, although she also takes Eliquis so would end up in the donut hole quickly. Will follow up with Leqvio copay to see if it's an affordable option in the future if needed - faxing enrollment form today.  Bo Teicher E. Khayden Herzberg, PharmD, BCACP, CPP [facility] [address] [place], [place] Phone: [phone]; Fax: [phone] 06/28/2021 9:07 AM  Addendum: 08/28/2021 covered at 80% until pt reaches $3,600 out of pocket max, then covered at 100%. 20% copay estimated to be ~$1000 per injection unfortunately.

## 2021-06-29 ENCOUNTER — Telehealth: Payer: Self-pay | Admitting: Physician Assistant

## 2021-06-29 ENCOUNTER — Telehealth (HOSPITAL_COMMUNITY): Payer: Self-pay | Admitting: *Deleted

## 2021-06-29 NOTE — Telephone Encounter (Signed)
Scheduled appt per 10/13 referral. Pt is aware of appt date and time.  

## 2021-07-04 ENCOUNTER — Encounter (HOSPITAL_COMMUNITY)
Admission: RE | Admit: 2021-07-04 | Discharge: 2021-07-04 | Disposition: A | Discharge status: Home / Self Care | Payer: Medicare Other | Source: Ambulatory Visit | Attending: Interventional Cardiology | Admitting: Interventional Cardiology

## 2021-07-04 ENCOUNTER — Other Ambulatory Visit: Payer: Self-pay

## 2021-07-04 ENCOUNTER — Encounter (HOSPITAL_COMMUNITY): Payer: Self-pay

## 2021-07-04 VITALS — BP 128/80 | HR 74 | Ht 62.0 in | Wt 128.7 lb

## 2021-07-04 DIAGNOSIS — Z951 Presence of aortocoronary bypass graft: Secondary | ICD-10-CM | POA: Insufficient documentation

## 2021-07-04 DIAGNOSIS — I214 Non-ST elevation (NSTEMI) myocardial infarction: Secondary | ICD-10-CM | POA: Insufficient documentation

## 2021-07-04 NOTE — Progress Notes (Signed)
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 Swallow whole., Disp: , Rfl:    CALCIUM PO, Take 1 tablet by mouth daily., Disp: , Rfl:    cetirizine (ZYRTEC) 10 MG tablet, Take 10 mg by mouth daily as needed for allergies., Disp: , Rfl:    Cholecalciferol (VITAMIN D PO), Take 1 tablet by mouth every morning., Disp: , Rfl:    ezetimibe (ZETIA) 10 MG tablet, Take 1 tablet (10 mg total) by mouth daily., Disp: 30 tablet, Rfl: 11   fluticasone (FLONASE) 50 MCG/ACT nasal spray, Place 1 spray into both nostrils daily as needed for allergies., Disp: , Rfl:    metoprolol tartrate (LOPRESSOR) 25 MG tablet, Take 3 tablets (75 mg total) by mouth 2 (two) times daily., Disp: 540 tablet, Rfl: 3   Multiple Vitamin (MULTIVITAMIN WITH MINERALS) TABS tablet, Take 1 tablet by mouth at bedtime. Centrum silver., Disp: , Rfl:    Propylene Glycol (SYSTANE BALANCE OP), Apply 1 drop to eye every 4 (four) hours as  needed (dry eyes.)., Disp: , Rfl:    vitamin C (ASCORBIC ACID) 500 MG tablet, Take 500 mg by mouth every morning., Disp: , Rfl:    vitamin E 180 MG (400 UNITS) capsule, Take 1 capsule (400 Units total) by mouth every morning., Disp: , Rfl:    zinc gluconate 50 MG tablet, Take 50 mg by mouth daily., Disp: , Rfl:    rosuvastatin (CRESTOR) 5 MG tablet, Take 1 tablet (5 mg total) by mouth daily., Disp: 30 tablet, Rfl: 11  Past Medical History: Past Medical History:  Diagnosis Date   Arthritis    Asthma    CAD (coronary artery disease)    s/p CABG   Chronic kidney disease, stage 3 (HCC)    Dyslipidemia 05/13/2021   Headache    Hypercholesteremia    Hyperlipidemia    Osteopenia    Personal history of colonic polyps    Pleural effusion    recurrent    Tobacco Use: Social History   Tobacco Use  Smoking Status Never  Smokeless Tobacco Never    Labs: Recent Review Flowsheet Data     Labs for ITP Cardiac and Pulmonary Rehab Latest Ref Rng & Units 03/24/2021 03/24/2021 03/24/2021 03/25/2021 03/25/2021   Cholestrol 0 - 200 mg/dL - - - - -   LDLCALC 0 - 99 mg/dL - - - - -   HDL >  40 mg/dL - - - - -   Trlycerides <150 mg/dL - - - - -   Hemoglobin A1c 4.8 - 5.6 % - - - - -   PHART 7.350 - 7.450 7.438 7.339(L) 7.356 7.389 7.364   PCO2ART 32.0 - 48.0 mmHg 39.3 43.6 41.3 33.6 38.8   HCO3 20.0 - 28.0 mmol/L 26.6 24.0 23.0 20.2 22.0   TCO2 22 - 32 mmol/L 28 25 24  21(L) 23   ACIDBASEDEF 0.0 - 2.0 mmol/L - 2.0 2.0 4.0(H) 3.0(H)   O2SAT % 100.0 99.0 99.0 99.0 98.0       Capillary Blood Glucose: Lab Results  Component Value Date   GLUCAP 136 (H) 03/26/2021   GLUCAP 111 (H) 03/26/2021   GLUCAP 110 (H) 03/26/2021   GLUCAP 135 (H) 03/25/2021   GLUCAP 145 (H) 03/25/2021     Exercise Target Goals: Exercise Program Goal: Individual exercise prescription set using results from initial 6 min walk test and THRR while considering  patient's activity barriers and safety.   Exercise Prescription  Goal: Starting with aerobic activity 30 plus minutes a day, 3 days per week for initial exercise prescription. Provide home exercise prescription and guidelines that participant acknowledges understanding prior to discharge.  Activity Barriers & Risk Stratification:  Activity Barriers & Cardiac Risk Stratification - 07/04/21 1412       Activity Barriers & Cardiac Risk Stratification   Activity Barriers Balance Concerns;Deconditioning;Shortness of Breath    Cardiac Risk Stratification High             6 Minute Walk:  6 Minute Walk     Row Name 07/04/21 1409         6 Minute Walk   Phase Initial     Distance 1200 feet     Walk Time 6 minutes     # of Rest Breaks 0     MPH 2.3     METS 2.18     RPE 11     Perceived Dyspnea  0.5     VO2 Peak 7.62     Symptoms Yes (comment)     Comments mild SOB pt felt was due to mask (RPD = 0.5)     Resting HR 74 bpm     Resting BP 128/80     Resting Oxygen Saturation  97 %     Exercise Oxygen Saturation  during 6 min walk 96 %     Max Ex. HR 97 bpm     Max Ex. BP 136/82     2 Minute Post BP 130/80              Oxygen Initial Assessment:   Oxygen Re-Evaluation:   Oxygen Discharge (Final Oxygen Re-Evaluation):   Initial Exercise Prescription:  Initial Exercise Prescription - 07/04/21 1400       Date of Initial Exercise RX and Referring Provider   Date 07/04/21    Referring Provider 07/06/21, MD    Expected Discharge Date 07/04/21      NuStep   Level 2    SPM 75    Minutes 30    METs 2      Prescription Details   Frequency (times per week) 3    Duration Progress to 30 minutes of continuous aerobic without signs/symptoms of physical distress      Intensity   THRR 40-80% of Max Heartrate 56-111    Ratings of Perceived Exertion 11-13    Perceived Dyspnea 0-4  Progression   Progression Continue progressive overload as per policy without signs/symptoms or physical distress.      Resistance  Training   Training Prescription Yes    Weight 2 lbs    Reps 10-15             Perform Capillary Blood Glucose checks as needed.  Exercise Prescription Changes:   Exercise Comments:   Exercise Goals and Review:   Exercise Goals     Row Name 07/04/21 1414             Exercise Goals   Increase Physical Activity Yes       Intervention Provide advice, education, support and counseling about physical activity/exercise needs.;Develop an individualized exercise prescription for aerobic and resistive training based on initial evaluation findings, risk stratification, comorbidities and participant's personal goals.       Expected Outcomes Short Term: Attend rehab on a regular basis to increase amount of physical activity.;Long Term: Add in home exercise to make exercise part of routine and to increase amount of physical activity.;Long Term: Exercising regularly at least 3-5 days a week.       Increase Strength and Stamina Yes       Intervention Provide advice, education, support and counseling about physical activity/exercise needs.;Develop an individualized exercise prescription for aerobic and resistive training based on initial evaluation findings, risk stratification, comorbidities and participant's personal goals.       Expected Outcomes Short Term: Increase workloads from initial exercise prescription for resistance, speed, and METs.;Short Term: Perform resistance training exercises routinely during rehab and add in resistance training at home;Long Term: Improve cardiorespiratory fitness, muscular endurance and strength as measured by increased METs and functional capacity ( )       Able to understand and use rate of perceived exertion (RPE) scale Yes       Intervention Provide education and explanation on how to use RPE scale       Expected Outcomes Short Term: Able to use RPE daily in rehab to express subjective intensity level;Long Term:  Able to use RPE to guide intensity  level when exercising independently       Knowledge and understanding of Target Heart Rate Range (THRR) Yes       Intervention Provide education and explanation of THRR including how the numbers were predicted and where they are located for reference       Expected Outcomes Short Term: Able to state/look up THRR;Long Term: Able to use THRR to govern intensity when exercising independently;Short Term: Able to use daily as guideline for intensity in rehab       Understanding of Exercise Prescription Yes       Intervention Provide education, explanation, and written materials on patient's individual exercise prescription       Expected Outcomes Short Term: Able to explain program exercise prescription;Long Term: Able to explain home exercise prescription to exercise independently                Exercise Goals Re-Evaluation :    Discharge Exercise Prescription (Final Exercise Prescription Changes):   Nutrition:  Target Goals: Understanding of nutrition guidelines, daily intake of sodium 1500mg , cholesterol 200mg , calories 30% from fat and 7% or less from saturated fats, daily to have 5 or more servings of fruits and vegetables.  Biometrics:  Pre Biometrics - 07/04/21 1000       Pre Biometrics   Waist Circumference 36.5 inches    Hip Circumference 38.5 inches    Waist to  Hip Ratio 0.95 %    Triceps Skinfold 22 mm    % Body Fat 37.5 %    Grip Strength 24 kg    Flexibility 12 in    Single Leg Stand 5.25 seconds              Nutrition Therapy Plan and Nutrition Goals:   Nutrition Assessments:  MEDIFICTS Score Key: ?70 Need to make dietary changes  40-70 Heart Healthy Diet ? 40 Therapeutic Level Cholesterol Diet   Picture Your Plate Scores: <51 Unhealthy dietary pattern with much room for improvement. 41-50 Dietary pattern unlikely to meet recommendations for good health and room for improvement. 51-60 More healthful dietary pattern, with some room for improvement.   >60 Healthy dietary pattern, although there may be some specific behaviors that could be improved.    Nutrition Goals Re-Evaluation:   Nutrition Goals Discharge (Final Nutrition Goals Re-Evaluation):   Psychosocial: Target Goals: Acknowledge presence or absence of significant depression and/or stress, maximize coping skills, provide positive support system. Participant is able to verbalize types and ability to use techniques and skills needed for reducing stress and depression.  Initial Review & Psychosocial Screening:  Initial Psych Review & Screening - 07/04/21 1532       Initial Review   Current issues with None Identified      Family Dynamics   Good Support System? Yes   Estefania lives alone. Imari has her daughter for support     Barriers   Psychosocial barriers to participate in program There are no identifiable barriers or psychosocial needs.             Quality of Life Scores:  Quality of Life - 07/04/21 1404       Quality of Life   Select Quality of Life      Quality of Life Scores   Health/Function Pre 21.14 %    Socioeconomic Pre 27.5 %    Psych/Spiritual Pre 28.43 %    Family Pre 30 %    GLOBAL Pre 25.23 %            Scores of 19 and below usually indicate a poorer quality of life in these areas.  A difference of  2-3 points is a clinically meaningful difference.  A difference of 2-3 points in the total score of the Quality of Life Index has been associated with significant improvement in overall quality of life, self-image, physical symptoms, and general health in studies assessing change in quality of life.  PHQ-9: Recent Review Flowsheet Data     Depression screen [facility] 2/9 07/04/2021   Decreased Interest 0   Down, Depressed, Hopeless 0   PHQ - 2 Score 0      Interpretation of Total Score  Total Score Depression Severity:  1-4 = Minimal depression, 5-9 = Mild depression, 10-14 = Moderate depression, 15-19 = Moderately severe depression,  20-27 = Severe depression   Psychosocial Evaluation and Intervention:   Psychosocial Re-Evaluation:   Psychosocial Discharge (Final Psychosocial Re-Evaluation):   Vocational Rehabilitation: Provide vocational rehab assistance to qualifying candidates.   Vocational Rehab Evaluation & Intervention:  Vocational Rehab - 07/04/21 1534       Initial Vocational Rehab Evaluation & Intervention   Assessment shows need for Vocational Rehabilitation No   Alyvia is retired and does not need vocational rehab a this time            Education: Education Goals: Education classes will be provided on a weekly basis, covering  required topics. Participant will state understanding/return demonstration of topics presented.  Learning Barriers/Preferences:  Learning Barriers/Preferences - 07/04/21 1405       Learning Barriers/Preferences   Learning Barriers None    Learning Preferences Audio;Written Material;Computer/Internet;Group Instruction;Individual Instruction;Pictoral;Skilled Demonstration;Verbal Instruction;Video             Education Topics: Hypertension, Hypertension Reduction -Define heart disease and high blood pressure. Discus how high blood pressure affects the body and ways to reduce high blood pressure.   Exercise and Your Heart -Discuss why it is important to exercise, the FITT principles of exercise, normal and abnormal responses to exercise, and how to exercise safely.   Angina -Discuss definition of angina, causes of angina, treatment of angina, and how to decrease risk of having angina.   Cardiac Medications -Review what the following cardiac medications are used for, how they affect the body, and side effects that may occur when taking the medications.  Medications include Aspirin, Beta blockers, calcium channel blockers, ACE Inhibitors, angiotensin receptor blockers, diuretics, digoxin, and antihyperlipidemics.   Congestive Heart Failure -Discuss the  definition of CHF, how to live with CHF, the signs and symptoms of CHF, and how keep track of weight and sodium intake.   Heart Disease and Intimacy -Discus the effect sexual activity has on the heart, how changes occur during intimacy as we age, and safety during sexual activity.   Smoking Cessation / COPD -Discuss different methods to quit smoking, the health benefits of quitting smoking, and the definition of COPD.   Nutrition I: Fats -Discuss the types of cholesterol, what cholesterol does to the heart, and how cholesterol levels can be controlled.   Nutrition II: Labels -Discuss the different components of food labels and how to read food label   Heart Parts/Heart Disease and PAD -Discuss the anatomy of the heart, the pathway of blood circulation through the heart, and these are affected by heart disease.   Stress I: Signs and Symptoms -Discuss the causes of stress, how stress may lead to anxiety and depression, and ways to limit stress.   Stress II: Relaxation -Discuss different types of relaxation techniques to limit stress.   Warning Signs of Stroke / TIA -Discuss definition of a stroke, what the signs and symptoms are of a stroke, and how to identify when someone is having stroke.   Knowledge Questionnaire Score:  Knowledge Questionnaire Score - 07/04/21 1405       Knowledge Questionnaire Score   Pre Score 20/24             Core Components/Risk Factors/Patient Goals at Admission:  Personal Goals and Risk Factors at Admission - 07/04/21 1406       Core Components/Risk Factors/Patient Goals on Admission    Weight Management Weight Maintenance    Hypertension Yes    Intervention Provide education on lifestyle modifcations including regular physical activity/exercise, weight management, moderate sodium restriction and increased consumption of fresh fruit, vegetables, and low fat dairy, alcohol moderation, and smoking cessation.;Monitor prescription use  compliance.    Expected Outcomes Short Term: Continued assessment and intervention until BP is < 140/58mm HG in hypertensive participants. < 130/46mm HG in hypertensive participants with diabetes, heart failure or chronic kidney disease.;Long Term: Maintenance of blood pressure at goal levels.    Lipids Yes    Intervention Provide education and support for participant on nutrition & aerobic/resistive exercise along with prescribed medications to achieve LDL 70mg , HDL >40mg .    Expected Outcomes Short Term: Participant states understanding of desired  cholesterol values and is compliant with medications prescribed. Participant is following exercise prescription and nutrition guidelines.;Long Term: Cholesterol controlled with medications as prescribed, with individualized exercise RX and with personalized nutrition plan. Value goals: LDL < , HDL > 40 mg.    Stress Yes   Husband passed away in 10-Feb-2019 dealing with closure of estate   Intervention Offer individual and/or small group education and counseling on adjustment to heart disease, stress management and health-related lifestyle change. Teach and support self-help strategies.;Refer participants experiencing significant psychosocial distress to appropriate mental health specialists for further evaluation and treatment. When possible, include family members and significant others in education/counseling sessions.    Expected Outcomes Short Term: Participant demonstrates changes in health-related behavior, relaxation and other stress management skills, ability to obtain effective social support, and compliance with psychotropic medications if prescribed.;Long Term: Emotional wellbeing is indicated by absence of clinically significant psychosocial distress or social isolation.             Core Components/Risk Factors/Patient Goals Review:    Core Components/Risk Factors/Patient Goals at Discharge (Final Review):    ITP Comments:  ITP Comments      Row Name 07/04/21 1144           ITP Comments Dr Armanda Magic MD, Medical Director                Comments:Sumiko attended orientation on 07/04/2021 to review rules and guidelines for program.  Completed 6 minute walk test, Intitial ITP, and exercise prescription.  VSS. Telemetry-Sinus Rhythm.  Daisja reported  having mild shortness of breath otherwise due to wearing a mask otherwise Asymptomatic. Safety measures and social distancing in place per CDC guidelines. Thayer Headings RN BSN

## 2021-07-04 NOTE — Progress Notes (Signed)
Cardiac Rehab Medication Review by a Nurse  Does the patient  feel that his/her medications are working for him/her?  no  Has the patient been experiencing any side effects to the medications prescribed?  no  Does the patient have any problems obtaining medications due to transportation or finances?   no  Understanding of regimen: good Understanding of indications: good Potential of compliance: good        Thayer Headings RN 07/04/2021 3:29 PM

## 2021-07-05 ENCOUNTER — Encounter: Payer: Self-pay | Admitting: Physician Assistant

## 2021-07-05 ENCOUNTER — Inpatient Hospital Stay: Payer: Medicare Other | Attending: Physician Assistant | Admitting: Physician Assistant

## 2021-07-05 ENCOUNTER — Inpatient Hospital Stay: Payer: Medicare Other

## 2021-07-05 VITALS — BP 167/78 | HR 79 | Temp 97.7°F | Resp 17 | Ht 62.0 in | Wt 126.8 lb

## 2021-07-05 DIAGNOSIS — J90 Pleural effusion, not elsewhere classified: Secondary | ICD-10-CM | POA: Insufficient documentation

## 2021-07-05 DIAGNOSIS — M8580 Other specified disorders of bone density and structure, unspecified site: Secondary | ICD-10-CM | POA: Insufficient documentation

## 2021-07-05 DIAGNOSIS — D75839 Thrombocytosis, unspecified: Secondary | ICD-10-CM | POA: Insufficient documentation

## 2021-07-05 DIAGNOSIS — D508 Other iron deficiency anemias: Secondary | ICD-10-CM | POA: Diagnosis not present

## 2021-07-05 DIAGNOSIS — D509 Iron deficiency anemia, unspecified: Secondary | ICD-10-CM | POA: Diagnosis not present

## 2021-07-05 DIAGNOSIS — N183 Chronic kidney disease, stage 3 unspecified: Secondary | ICD-10-CM | POA: Insufficient documentation

## 2021-07-05 LAB — RETIC PANEL
Immature Retic Fract: 15.4 % (ref 2.3–15.9)
RBC.: 4.11 MIL/uL (ref 3.87–5.11)
Retic Count, Absolute: 54.3 10*3/uL (ref 19.0–186.0)
Retic Ct Pct: 1.3 % (ref 0.4–3.1)
Reticulocyte Hemoglobin: 31.8 pg (ref >27.9)

## 2021-07-05 LAB — CMP (CANCER CENTER ONLY)
ALT: 23 U/L (ref 0–44)
AST: 19 U/L (ref 15–41)
Albumin: 4 g/dL (ref 3.5–5.0)
Alkaline Phosphatase: 144 U/L — ABNORMAL HIGH (ref 38–126)
Anion gap: 10 (ref 5–15)
BUN: 20 mg/dL (ref 8–23)
CO2: 24 mmol/L (ref 22–32)
Calcium: 10 mg/dL (ref 8.9–10.3)
Chloride: 104 mmol/L (ref 98–111)
Creatinine: 1.14 mg/dL — ABNORMAL HIGH (ref 0.44–1.00)
GFR, Estimated: 48 mL/min — ABNORMAL LOW (ref >60)
Glucose, Bld: 94 mg/dL (ref 70–99)
Potassium: 4.4 mmol/L (ref 3.5–5.1)
Sodium: 138 mmol/L (ref 135–145)
Total Bilirubin: 0.2 mg/dL — ABNORMAL LOW (ref 0.3–1.2)
Total Protein: 7.8 g/dL (ref 6.5–8.1)

## 2021-07-05 LAB — CBC WITH DIFFERENTIAL (CANCER CENTER ONLY)
Abs Immature Granulocytes: 0.03 10*3/uL (ref 0.00–0.07)
Basophils Absolute: 0.1 10*3/uL (ref 0.0–0.1)
Basophils Relative: 1 %
Eosinophils Absolute: 0.2 10*3/uL (ref 0.0–0.5)
Eosinophils Relative: 2 %
HCT: 36.7 % (ref 36.0–46.0)
Hemoglobin: 11.8 g/dL — ABNORMAL LOW (ref 12.0–15.0)
Immature Granulocytes: 0 %
Lymphocytes Relative: 17 %
Lymphs Abs: 1.6 10*3/uL (ref 0.7–4.0)
MCH: 28.3 pg (ref 26.0–34.0)
MCHC: 32.2 g/dL (ref 30.0–36.0)
MCV: 88 fL (ref 80.0–100.0)
Monocytes Absolute: 0.9 10*3/uL (ref 0.1–1.0)
Monocytes Relative: 9 %
Neutro Abs: 6.7 10*3/uL (ref 1.7–7.7)
Neutrophils Relative %: 71 %
Platelet Count: 453 10*3/uL — ABNORMAL HIGH (ref 150–400)
RBC: 4.17 MIL/uL (ref 3.87–5.11)
RDW: 15.3 % (ref 11.5–15.5)
WBC Count: 9.4 10*3/uL (ref 4.0–10.5)
nRBC: 0 % (ref 0.0–0.2)

## 2021-07-05 LAB — IRON AND TIBC
Iron: 28 ug/dL — ABNORMAL LOW (ref 41–142)
Saturation Ratios: 9 % — ABNORMAL LOW (ref 21–57)
TIBC: 310 ug/dL (ref 236–444)
UIBC: 282 ug/dL (ref 120–384)

## 2021-07-05 LAB — SEDIMENTATION RATE: Sed Rate: 56 mm/hr — ABNORMAL HIGH (ref 0–22)

## 2021-07-05 LAB — C-REACTIVE PROTEIN: CRP: 0.8 mg/dL (ref <1.0)

## 2021-07-05 LAB — FERRITIN: Ferritin: 118 ng/mL (ref 11–307)

## 2021-07-05 MED ORDER — FERROUS SULFATE 325 (65 FE) MG PO TBEC: 325.0000 mg | DELAYED_RELEASE_TABLET | Freq: Every day | ORAL | 3 refills | Status: DC

## 2021-07-05 NOTE — Progress Notes (Signed)
[facility] [phone]   [phone]  INITIAL CONSULT NOTE  Patient Care Team: Ileana Ladd, MD as PCP - General (Family Medicine) Corky Crafts, MD as PCP - Cardiology (Cardiology)  Hematological/Oncological History 1) Labs from PCP, Dr. Kathreen Devoid: -05/17/2021: WBC 9.8, Hgb 11.7 (L), MCV 87.7, Plt 564 (H)  2) Labs from ED visit: -06/15/2021: WBC 12.5 (H), Hgb 10.9 (L), MCV 87.9, Plt 577 (H)  3) 07/05/2021: Establish care with [facility] Hematology/Oncology  CHIEF COMPLAINTS/PURPOSE OF CONSULTATION:  "Thrombocytosis"  HISTORY OF PRESENTING ILLNESS:  Kimberly Calhoun 80 y.o. 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 Fib and arthritis. Patient is unaccompanied for this visit.   On exam today, Ms. Lohn reports that her energy levels are fairly stable and is able to complete her ADLs independently. She has a good appetite and denies any recent weight changes. She denies nausea, vomiting or abdominal pain. Her bowel movements are unchanged. She has a daily bowel movement. She denies easy bruising or signs of bleeding. She denies fevers, chills, night sweats, shortness of breath,chest pain, cough, headaches or dizziness. She has no other complaints. Rest of the 10 point ROS is below.   MEDICAL HISTORY:  Past Medical History:  Diagnosis Date   Arthritis    Asthma    CAD (coronary artery disease)    s/p CABG   Chronic kidney disease, stage 3 (HCC)    Dyslipidemia 05/13/2021   Headache    Hypercholesteremia    Hyperlipidemia    Osteopenia    Personal history of colonic polyps    Pleural effusion    recurrent    SURGICAL HISTORY: Past Surgical History:  Procedure Laterality Date   ABDOMINAL HYSTERECTOMY  02/15/2013   prolapse   CARDIAC CATHETERIZATION     COLONOSCOPY WITH PROPOFOL N/A 12/07/2014   Procedure: COLONOSCOPY WITH PROPOFOL;  Surgeon: Bernette Redbird, MD;  Location: [facility] ENDOSCOPY;  Service: Endoscopy;  Laterality: N/A;  ultraslim   CORONARY ARTERY BYPASS GRAFT N/A 03/24/2021   Procedure: CORONARY ARTERY BYPASS GRAFTING (CABG) TIMES THREE USING LEFT INTERNAL MAMMARY ARTERY AND RIGHT GREATER SAPHENOUS VEIN HARVESTED ENDOSCOPICALLY;  Surgeon: Linden Dolin, MD;  Location: [facility] OR;  Service: Open Heart Surgery;  Laterality: N/A;   IR THORACENTESIS ASP PLEURAL SPACE W/IMG GUIDE  04/17/2021   LEFT HEART CATH AND CORONARY ANGIOGRAPHY N/A 03/22/2021   Procedure: LEFT HEART CATH AND CORONARY ANGIOGRAPHY;  Surgeon: Lennette Bihari, MD;  Location: [facility] INVASIVE CV LAB;  Service: Cardiovascular;  Laterality: N/A;   TEE WITHOUT CARDIOVERSION N/A 03/24/2021   Procedure: TRANSESOPHAGEAL ECHOCARDIOGRAM (TEE);  Surgeon: Linden Dolin, MD;  Location: [facility] OR;  Service: Open Heart Surgery;  Laterality: N/A;    SOCIAL HISTORY: Social History   Socioeconomic History   Marital status: Widowed    Spouse name: Not on file   Number of children: 1   Years of education: 59   Highest education level: Not on file  Occupational History   Occupation: retired  Tobacco Use   Smoking status: Never   Smokeless tobacco: Never  Vaping Use   Vaping Use: Never used  Substance and Sexual Activity   Alcohol use: No   Drug use: No   Sexual activity: Not Currently  Other Topics Concern   Not on file  Social History Narrative   Not on file   Social Determinants of Corporate investment banker  Strain: Not on file  Food Insecurity: Not on file  Transportation Needs: Not on file  Physical Activity: Not on file  Stress: Not on file  Social Connections: Not on file  Intimate Partner Violence: Not on file    FAMILY HISTORY: Family History  Problem Relation Age of Onset   Cancer - Colon Mother    Heart disease Father    Atrial fibrillation Sister    Rheum arthritis Sister    Diabetes Brother    Breast cancer Neg Hx     ALLERGIES:  is allergic to  amiodarone, macrobid [nitrofurantoin], mold extract [trichophyton], statins, and other.  MEDICATIONS:  Current Outpatient Medications  Medication Sig Dispense Refill   acetaminophen (TYLENOL) 500 MG tablet Take 500-1,000 mg by mouth every 6 (six) hours as needed for moderate pain or headache.     apixaban (ELIQUIS) 5 MG TABS tablet Take 1 tablet (5 mg total) by mouth 2 (two) times daily. 60 tablet 2   aspirin 81 MG EC tablet Take 81 mg by mouth daily. Swallow whole.     CALCIUM PO Take 1 tablet by mouth daily.     Cholecalciferol (VITAMIN D PO) Take 1 tablet by mouth every morning.     ezetimibe (ZETIA) 10 MG tablet Take 1 tablet (10 mg total) by mouth daily. 30 tablet 11   ferrous sulfate 325 (65 FE) MG EC tablet Take 1 tablet (325 mg total) by mouth daily with breakfast. 30 tablet 3   fluticasone (FLONASE) 50 MCG/ACT nasal spray Place 1 spray into both nostrils daily as needed for allergies.     metoprolol tartrate (LOPRESSOR) 25 MG tablet Take 3 tablets (75 mg total) by mouth 2 (two) times daily. 540 tablet 3   Multiple Vitamin (MULTIVITAMIN WITH MINERALS) TABS tablet Take 1 tablet by mouth at bedtime. Centrum silver.     Propylene Glycol (SYSTANE BALANCE OP) Apply 1 drop to eye every 4 (four) hours as needed (dry eyes.).     rosuvastatin (CRESTOR) 5 MG tablet Take 1 tablet (5 mg total) by mouth daily. 30 tablet 11   vitamin C (ASCORBIC ACID) 500 MG tablet Take 500 mg by mouth every morning.     vitamin E 180 MG (400 UNITS) capsule Take 1 capsule (400 Units total) by mouth every morning.     zinc gluconate 50 MG tablet Take 50 mg by mouth daily.     albuterol (VENTOLIN HFA) 108 (90 Base) MCG/ACT inhaler Inhale 1 puff into the lungs every 6 (six) hours as needed for shortness of breath. (Patient not taking: Reported on 07/05/2021)     cetirizine (ZYRTEC) 10 MG tablet Take 10 mg by mouth daily as needed for allergies. (Patient not taking: Reported on 07/05/2021)     No current  facility-administered medications for this visit.    REVIEW OF SYSTEMS:   Constitutional: ( - ) fevers, ( - )  chills , ( - ) night sweats Eyes: ( - ) blurriness of vision, ( - ) double vision, ( - ) watery eyes Ears, nose, mouth, throat, and face: ( - ) mucositis, ( - ) sore throat Respiratory: ( - ) cough, ( - ) dyspnea, ( - ) wheezes Cardiovascular: ( - ) palpitation, ( - ) chest discomfort, ( - ) lower extremity swelling Gastrointestinal:  ( - ) nausea, ( - ) heartburn, ( - ) change in bowel habits Skin: ( - ) abnormal skin rashes Lymphatics: ( - ) new lymphadenopathy, ( - )  easy bruising Neurological: ( - ) numbness, ( - ) tingling, ( - ) new weaknesses Behavioral/Psych: ( - ) mood change, ( - ) new changes  All other systems were reviewed with the patient and are negative.  PHYSICAL EXAMINATION: ECOG PERFORMANCE STATUS: 0 - Asymptomatic  Vitals:   07/05/21 1054  BP: (!) 167/78  Pulse: 79  Resp: 17  Temp: 97.7 F (36.5 C)  SpO2: 96%   Filed Weights   07/05/21 1054  Weight: 126 lb 12.8 oz (57.5 kg)    GENERAL: well appearing female in NAD  SKIN: skin color, texture, turgor are normal, no rashes or significant lesions EYES: conjunctiva are pink and non-injected, sclera clear OROPHARYNX: no exudate, no erythema; lips, buccal mucosa, and tongue normal  NECK: supple, non-tender LYMPH:  no palpable lymphadenopathy in the cervical or supraclavicular lymph nodes.  LUNGS: clear to auscultation and percussion with normal breathing effort HEART: regular rate & rhythm and no murmurs and no lower extremity edema ABDOMEN: soft, non-tender, non-distended, normal bowel sounds Musculoskeletal: no cyanosis of digits and no clubbing  PSYCH: alert & oriented x 3, fluent speech NEURO: no focal motor/sensory deficits  LABORATORY DATA:  I have reviewed the data as listed CBC Latest Ref Rng & Units 07/05/2021 06/15/2021 05/14/2021  WBC 4.0 - 10.5 K/uL 9.4 12.5(H) 10.0  Hemoglobin 12.0 -  15.0 g/dL 11.8(L) 10.9(L) 11.3(L)  Hematocrit 36.0 - 46.0 % 36.7 33.3(L) 34.5(L)  Platelets 150 - 400 K/uL 453(H) 577(H) 500(H)    CMP Latest Ref Rng & Units 07/05/2021 06/15/2021 05/14/2021  Glucose 70 - 99 mg/dL 94 98 798(X)  BUN 8 - 23 mg/dL 20 21(J) 11  Creatinine 0.44 - 1.00 mg/dL 9.41(D) 4.08 1.44  Sodium 135 - 145 mmol/L 138 134(L) 134(L)  Potassium 3.5 - 5.1 mmol/L 4.4 3.9 3.8  Chloride 98 - 111 mmol/L 104 101 102  CO2 22 - 32 mmol/L 24 24 26   Calcium 8.9 - 10.3 mg/dL 9.2 81.8)  Total Protein 6.5 - 8.1 g/dL 7.8 - -  Total Bilirubin 0.3 - 1.2 mg/dL 5.6(D) - -  Alkaline Phos 38 - 126 U/L 144(H) - -  AST 15 - 41 U/L 19 - -  ALT 0 - 44 U/L 23 - -   RADIOGRAPHIC STUDIES: I have personally reviewed the radiological images as listed and agreed with the findings in the report. DG Chest 2 View  Result Date: 06/27/2021 CLINICAL DATA:  Status post CABG. EXAM: CHEST - 2 VIEW COMPARISON:  06/15/2021 FINDINGS: Postop changes from median sternotomy. Normal heart size. Small to moderate left pleural effusion is identified. No right effusion. No interstitial edema or airspace consolidation. IMPRESSION: Small to moderate left pleural effusion. Electronically Signed   By: 06/17/2021 M.D.   On: 06/27/2021 14:41   DG Chest 2 View  Result Date: 06/15/2021 CLINICAL DATA:  Shortness of breath, history of atrial fibrillation and fluid accumulation on the lungs. EXAM: CHEST - 2 VIEW COMPARISON:  CT June 12, 2021 and chest x-ray from June 08, 2021. FINDINGS: Lung show increased aeration since previous imaging particularly RIGHT chest with flattening of the RIGHT hemidiaphragm. Persistent LEFT-sided hydropneumothorax with apical and basilar component. Size slightly diminished accounting for increased inflation in the chest with a 1.7 cm distance from the RIGHT lung apex as compared to 3.1 cm. Pleural fluid is likely slightly increased since the previous study. Median sternotomy changes are  noted. Cardiomediastinal contours and hilar structures are stable. LEFT basilar airspace disease persists.  EKG leads project over the chest. On limited assessment there is no acute skeletal process. IMPRESSION: Persistent LEFT-sided hydropneumothorax with apical and basilar component. Pneumothorax component may be slightly smaller accounting for hyperinflation with suggestion of slightly increased pleural fluid in the LEFT lung base. Persistent LEFT basilar airspace disease. Electronically Signed   By: Donzetta Kohut M.D.   On: 06/15/2021 08:32   DG Chest 2 View  Result Date: 06/08/2021 CLINICAL DATA:  Pneumothorax EXAM: CHEST - 2 VIEW COMPARISON:  06/07/2021, 05/29/2021 FINDINGS: Left hydropneumothorax appears relatively stable in size within increasing fluid component seen at the left lung base. Pleural thickening noted at the left lung base and associated basilar collapse and consolidation may reflect changes of an underlying trapped lung. No evidence of mediastinal shift or left hemithorax hyperexpansion to suggest tension physiology. Right lung is clear. No pneumothorax or significant pleural effusion on the right. Median sternotomy has been performed. Cardiac size within normal limits. Pulmonary vascularity is normal. IMPRESSION: Stable left hydropneumothorax with increasing fluid component. 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 Electronically Signed   By: Helyn Numbers M.D.   On: 06/08/2021 15:47   DG Chest 2 View  Result Date: 06/07/2021 CLINICAL DATA:  Follow-up thoracentesis EXAM: CHEST - 2 VIEW COMPARISON:  Chest radiograph 06/07/2021 at 204 hours FINDINGS: Again demonstrated sub pulmonic pneumothorax on the LEFT with no interval change. Poor expansion of the atelectatic LEFT lower lobe following thoracentesis. LEFT apical pneumothorax component with pleural edge measuring 2.1 cm from the apical chest wall which is slightly increased from 1.3 cm on  radiograph earlier same day. RIGHT lung clear.  No mediastinal shift IMPRESSION: 1. Mild expansion of the apical component of the LEFT pneumothorax. 2. Stable LEFT sub pulmonic pneumothorax with poor expansion of the atelectatic LEFT lower lobe following thoracentesis. These results will be called to the ordering clinician or representative by the Radiologist Assistant, and communication documented in the PACS or [employer]. Electronically Signed   By: Genevive Bi M.D.   On: 06/07/2021 16:20   DG Chest 2 View  Result Date: 06/07/2021 CLINICAL DATA:  Post thoracentesis EXAM: CHEST - 2 VIEW COMPARISON:  05/29/2021 FINDINGS: New small-moderate-sized left-sided pneumothorax, approximally 15% volume. No shift of the heart or mediastinal structures. Stable heart size. Prior median sternotomy. Right lung is clear. Decreased left pleural effusion. IMPRESSION: New small-moderate-sized left-sided pneumothorax post thoracentesis. These results will be called to the ordering clinician or representative by the Radiologist Assistant, and communication documented in the PACS or [employer]. Electronically Signed   By: Duanne Guess D.O.   On: 06/07/2021 14:30   CT CHEST WO CONTRAST  Result Date: 06/12/2021 CLINICAL DATA:  Follow-up pneumothorax. EXAM: CT CHEST WITHOUT CONTRAST TECHNIQUE: Multidetector CT imaging of the chest was performed following the standard protocol without IV contrast. COMPARISON:  CT chest 03/23/2021 and chest radiograph from 06/08/2021. FINDINGS: Cardiovascular: Postop change from median sternotomy and CABG procedure. There is a normal heart size.  Aortic atherosclerosis. Mediastinum/Nodes: No enlarged mediastinal or axillary lymph nodes. Thyroid gland, trachea, and esophagus demonstrate no significant findings. Lungs/Pleura: Moderate loculated left-sided hydropneumothorax is again identified. Compared with 06/08/2021 there is increasing fluid component which now extends up to the  level of the superior segment of the left lower lobe, image 110/6. The upper lobe pneumothorax component appears similar in volume to the chest radiograph from 06/08/2021 with a distance of 2.2 cm from the tip of the left upper lung to the  apical pleural surface. This is compared with 2.9 cm previously. The basilar component appears slightly increased with a distance of 4.3 cm from the lung to the lateral margin of the chest wall. This is compared with 3 cm previously. The right lung appears clear. Perifissural nodule along the minor fissure measures 6 mm. Upper Abdomen: No acute abnormality. Small low-density structure in the periphery of the right hepatic lobe measures 6 mm and is too small to characterize. Musculoskeletal: No chest wall mass or suspicious bone lesions identified. IMPRESSION: 1. Moderate loculated left-sided hydropneumothorax is again identified. Compared with 06/08/2021 there is increasing fluid component which now extends up to the level of the superior segment of left lower lobe. The basilar pneumothorax component appears increased in volume compared with. 06/08/2021. 2. Aortic Atherosclerosis (ICD10-I70.0).  Status post CABG. Electronically Signed   By: Signa Kell M.D.   On: 06/12/2021 16:42    ASSESSMENT & PLAN Kimberly Calhoun is a 80 y.o. female who presents to the clinic for evaluation for thrombocytosis. I reviewed differentials for thrombocytosis including iron deficiency anemia, inflammatory process, hx of splenectomy and bone marrow disorders. Patient denies undergoing splenectomy.  Patient underwent laboratory evaluation today and checked CBC, CMP, iron and TIBC, ferritin, retic panel, sedimentation rate and c-reactive protein. Results indicate iron deficiency anemia with hemoglobin of 11.8, iron saturation 9% and serum iron 28. There is mild elevation of sed rate. Thrombocytosis has improved to 453K.   I reviewed with the patient that thrombocytosis is most likely secondary  to iron deficiency anemia. Recommendation is to proceed with iron supplementation with ferrous sulfate 325 mg once daily. I advised to take iron with a source of vitamin C.   #Thrombocytosis: --Labs today confirm iron deficiency anemia. This is likely secondary to blood loss from CABG from July 2022.  --There is mild elevation of sed rate suggestion some inflammation as well.  --Recommend ferrous sulfate 325 mg once daily with a source of vitamin C. Sent prescription to pharmacy on file.  --RTC in 3 months with labs.   Orders Placed This Encounter  Procedures   CBC with Differential (Cancer Center Only)    Standing Status:   Future    Number of Occurrences:   1    Standing Expiration Date:   07/05/2022   CMP (Cancer Center only)    Standing Status:   Future    Number of Occurrences:   1    Standing Expiration Date:   07/05/2022   Ferritin    Standing Status:   Future    Number of Occurrences:   1    Standing Expiration Date:   07/05/2022   Iron and TIBC    Standing Status:   Future    Number of Occurrences:   1    Standing Expiration Date:   07/05/2022   Retic Panel    Standing Status:   Future    Number of Occurrences:   1    Standing Expiration Date:   07/05/2022   Sedimentation rate    Standing Status:   Future    Number of Occurrences:   1    Standing Expiration Date:   07/05/2022   C-reactive protein    Standing Status:   Future    Number of Occurrences:   1    Standing Expiration Date:   07/05/2022    All questions were answered. The patient knows to call the clinic with any problems, questions or concerns.  I have spent  a total of 60 minutes minutes of face-to-face and non-face-to-face time, preparing to see the patient, obtaining and/or reviewing separately obtained history, performing a medically appropriate examination, counseling and educating the patient, ordering medications/tests, documenting clinical information in the electronic health record, independently  interpreting results and communicating results to the patient, and care coordination.   Georga Kaufmann, PA-C Department of Hematology/Oncology [facility] at [facility] Phone: [phone]  Patient was seen by Dr. Leonides Schanz  I have read the above note and personally examined the patient. I agree with the assessment and plan as noted above.  Briefly Kimberly Calhoun is an 80 year old female who presents for evaluation of thrombocytosis.  At this time the patient's findings appear most consistent with iron deficiency anemia.  We will order full iron panel as well as inflammatory markers to confirm.  In the event that there is no clear etiology for this patient thrombocytosis would recommend MPN evaluation.  We will start with iron repletion therapy p.o. to see if this corrects her thrombocytosis.  Plan to see the patient back in approximately 3 months time to reevaluate.   Ulysees Barns, MD Department of Hematology/Oncology [facility] at [facility] Phone: [phone] Pager: [phone] Email: [email or web address]

## 2021-07-08 ENCOUNTER — Telehealth: Payer: Self-pay | Admitting: Cardiology

## 2021-07-08 NOTE — Telephone Encounter (Signed)
Patient called in reporting nosebleed to right nare which started this morning.  Also had some mild bleeding from her gums while brushing her teeth.  This has all resolved at the time of phone call.  States she had an episode 2 weeks ago of epistaxis which resolved quickly as well.  She is on Eliquis 5 mg twice daily for history of atrial fibrillation.  Last office note indicates she has been maintaining sinus rhythm.  Advised okay to hold evening dose of Eliquis with plans to resume tomorrow.  If has recurrent epistaxis may need outpatient referral to ENT/office visit to discuss further management.  She is agreeable to this plan.  ER precautions discussed.

## 2021-07-10 ENCOUNTER — Encounter (HOSPITAL_COMMUNITY): Payer: Medicare Other

## 2021-07-12 ENCOUNTER — Other Ambulatory Visit: Payer: Self-pay

## 2021-07-12 ENCOUNTER — Encounter (HOSPITAL_COMMUNITY)
Admission: RE | Admit: 2021-07-12 | Discharge: 2021-07-12 | Disposition: A | Discharge status: Home / Self Care | Payer: Medicare Other | Source: Ambulatory Visit | Attending: Interventional Cardiology | Admitting: Interventional Cardiology

## 2021-07-12 ENCOUNTER — Encounter (HOSPITAL_COMMUNITY): Payer: Medicare Other

## 2021-07-12 DIAGNOSIS — I214 Non-ST elevation (NSTEMI) myocardial infarction: Secondary | ICD-10-CM

## 2021-07-12 DIAGNOSIS — Z951 Presence of aortocoronary bypass graft: Secondary | ICD-10-CM

## 2021-07-12 NOTE — Progress Notes (Signed)
Daily Session Note  Patient Details  Name: Kimberly Calhoun MRN: [id number] Date of Birth: [birth year] Referring Provider:   Flowsheet Row CARDIAC REHAB PHASE II ORIENTATION from 07/04/2021 in [place]  Referring Provider Larae Grooms, MD       Encounter Date: 07/12/2021  Check In:  Session Check In - 07/12/21 0919       Check-In   Supervising physician immediately available to respond to emergencies [facility] immediately available    Physician(s) Dr. Verlon Au    Location [facility]    Staff Present Barnet Pall, RN, BSN;Ramon Dredge, RN, MHA;Jetta Walker BS, ACSM EP-C, Exercise Physiologist;Olinty [place], MS, ACSM CEP, Exercise Physiologist;Kaylee Rosana Hoes, MS, ACSM-CEP, Exercise Physiologist    Virtual Visit No    Medication changes reported     No    Fall or balance concerns reported    No    Tobacco Cessation No Change    Current number of cigarettes/nicotine per day     0    Warm-up and Cool-down Performed on first and last piece of equipment    Resistance Training Performed No    VAD Patient? No    PAD/SET Patient? No      Pain Assessment   Currently in Pain? No/denies    Pain Score 0-No pain    Multiple Pain Sites No             Capillary Blood Glucose: No results found for this or any previous visit (from the past 24 hour(s)).   Exercise Prescription Changes - 07/12/21 1009       Response to Exercise   Blood Pressure (Admit) 118/62    Blood Pressure (Exercise) 138/83    Blood Pressure (Exit) 124/72    Heart Rate (Admit) 77 bpm    Heart Rate (Exercise) 87 bpm    Heart Rate (Exit) 78 bpm    Oxygen Saturation (Exit) 95 %    Rating of Perceived Exertion (Exercise) 11    Perceived Dyspnea (Exercise) 0    Symptoms 0    Comments Pt first day in the CRP2 program    Duration Continue with 30 min of aerobic exercise without signs/symptoms of physical distress.    Intensity THRR unchanged       Progression   Progression Continue to progress workloads to maintain intensity without signs/symptoms of physical distress.    Average METs 1.7      Resistance Training   Training Prescription No      NuStep   Level 2    SPM 75    Minutes 30    METs 1.7             Social History   Tobacco Use  Smoking Status Never  Smokeless Tobacco Never    Goals Met:  Exercise tolerated well No report of concerns or symptoms today  Goals Unmet:  Not Applicable  Comments: [name] started cardiac rehab today.  Pt tolerated light exercise without difficulty. VSS, telemetry-Sinus Rhythm, asymptomatic.  Medication list reconciled. Pt denies barriers to medicaiton compliance.  PSYCHOSOCIAL ASSESSMENT:  PHQ-0. Pt exhibits positive coping skills, hopeful outlook with supportive family. No psychosocial needs identified at this time, no psychosocial interventions necessary.    Pt enjoys gardening reading and volunteering.   Pt oriented to exercise equipment and routine.    Understanding verbalized. Barnet Pall, RN,BSN 07/12/2021 10:22 AM    Dr. Fransico Him is Medical Director for Cardiac Rehab at [facility].

## 2021-07-14 ENCOUNTER — Other Ambulatory Visit: Payer: Self-pay

## 2021-07-14 ENCOUNTER — Encounter (HOSPITAL_COMMUNITY)
Admission: RE | Admit: 2021-07-14 | Discharge: 2021-07-14 | Disposition: A | Discharge status: Home / Self Care | Payer: Medicare Other | Source: Ambulatory Visit | Attending: Interventional Cardiology | Admitting: Interventional Cardiology

## 2021-07-14 ENCOUNTER — Telehealth: Payer: Self-pay | Admitting: Interventional Cardiology

## 2021-07-14 ENCOUNTER — Encounter: Payer: Self-pay | Admitting: Interventional Cardiology

## 2021-07-14 DIAGNOSIS — I214 Non-ST elevation (NSTEMI) myocardial infarction: Secondary | ICD-10-CM | POA: Diagnosis not present

## 2021-07-14 DIAGNOSIS — Z951 Presence of aortocoronary bypass graft: Secondary | ICD-10-CM

## 2021-07-14 NOTE — Progress Notes (Signed)
Telemetry rhythm Sinus with intermittent PAC's noted. Kimberly Calhoun is asymptomatic today.  VSS. Kimberly Calhoun denies shortness of breath. Upon review of medications. Kimberly Calhoun said that she had started taking zyrtec for nasal the past few days for nasal stuffiness. Kimberly Calhoun is taking her medications as prescribed. Kimberly Calhoun mentions at times she can feel a fluttering sometimes at home . Weight today is 58.2 kg which is stable. Will fax today's ECG tracings to Dr. Hoyle Barr  office for review. Kimberly Calhoun says she will stop taking the allergy medication for now. Kimberly Calhoun also said she is taking the rosuvastatin every other day due to her intolerance to statins. Kimberly Calhoun left cardiac rehab without complaints. Will continue to monitor the patient throughout  the program. Gladstone Lighter, RN,BSN 07/14/2021 10:58 AM

## 2021-07-14 NOTE — Telephone Encounter (Signed)
Maria from Cardiac Rehad, calling about PAC.

## 2021-07-14 NOTE — Telephone Encounter (Signed)
Kimberly Calhoun from Cardiac Rehab calling because she was being seen today. Kimberly Calhoun states pt was had frequent PAC today with no a-fib but having history of  a-fib. Pt is asymptomatic today, HR is 68 and O2 is 98%. Pt states that she took Zrytec but usually takes PRN. Pt is on Metoprolol 75 BID and Eliquis 5 mg. Kimberly Calhoun states that she will fax over EKG strip for Dr. Tenny Craw to access. Kimberly Calhoun told pt that Dr. Tenny Craw will call pt if she has any recommendations. Will forward to Dr. Tenny Craw.

## 2021-07-17 ENCOUNTER — Other Ambulatory Visit: Payer: Self-pay

## 2021-07-17 ENCOUNTER — Encounter (HOSPITAL_COMMUNITY)
Admission: RE | Admit: 2021-07-17 | Discharge: 2021-07-17 | Disposition: A | Discharge status: Home / Self Care | Payer: Medicare Other | Source: Ambulatory Visit | Attending: Interventional Cardiology | Admitting: Interventional Cardiology

## 2021-07-17 DIAGNOSIS — I214 Non-ST elevation (NSTEMI) myocardial infarction: Secondary | ICD-10-CM

## 2021-07-17 DIAGNOSIS — Z951 Presence of aortocoronary bypass graft: Secondary | ICD-10-CM

## 2021-07-19 ENCOUNTER — Encounter (HOSPITAL_COMMUNITY)
Admission: RE | Admit: 2021-07-19 | Discharge: 2021-07-19 | Disposition: A | Discharge status: Home / Self Care | Payer: Medicare Other | Source: Ambulatory Visit | Attending: Interventional Cardiology | Admitting: Interventional Cardiology

## 2021-07-19 ENCOUNTER — Other Ambulatory Visit: Payer: Self-pay

## 2021-07-19 DIAGNOSIS — Z951 Presence of aortocoronary bypass graft: Secondary | ICD-10-CM | POA: Insufficient documentation

## 2021-07-19 DIAGNOSIS — I214 Non-ST elevation (NSTEMI) myocardial infarction: Secondary | ICD-10-CM | POA: Diagnosis present

## 2021-07-19 NOTE — Progress Notes (Signed)
Kimberly Calhoun said that she felt a little lightheaded while turning her head at home yesterday. Denies symptoms today. Post exercise sitting BP 122/70. Standing BP 124/68 resting heart rate in the 70's. Telemetry rhythm Sinus. Minimal PAC's noted today. Encouraged Rosaleen to make sure that she is drinking enough fluids and to contact Dr Hoyle Barr office if she has an reoccurrence. Carylon exercise today without difficulties. Will continue to monitor the patient throughout  the program.Bernardette Waldron Harlon Flor, RN,BSN 07/19/2021 10:25 AM

## 2021-07-19 NOTE — Progress Notes (Signed)
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 Swallow whole., Disp: , Rfl:    CALCIUM PO, Take 1 tablet by mouth daily., Disp: , Rfl:    cetirizine (ZYRTEC) 10 MG tablet, Take 10 mg by mouth daily as needed for allergies., Disp: , Rfl:    Cholecalciferol (VITAMIN D PO), Take 1 tablet by mouth every morning., Disp: , Rfl:    ezetimibe (ZETIA) 10 MG tablet, Take 1 tablet (10 mg total) by mouth daily., Disp: 30 tablet, Rfl: 11   ferrous sulfate 325 (65 FE) MG EC tablet, Take 1 tablet (325 mg total) by mouth daily with breakfast., Disp: 30 tablet, Rfl: 3   fluticasone (FLONASE) 50 MCG/ACT nasal spray, Place 1 spray into both nostrils daily as needed for allergies. (Patient not taking: Reported on 07/14/2021), Disp: , Rfl:    metoprolol tartrate (LOPRESSOR) 25 MG tablet, Take 3 tablets (75 mg total) by mouth 2 (two) times daily., Disp: 540 tablet, Rfl: 3   Multiple Vitamin (MULTIVITAMIN WITH  MINERALS) TABS tablet, Take 1 tablet by mouth at bedtime. Centrum silver., Disp: , Rfl:    Propylene Glycol (SYSTANE BALANCE OP), Apply 1 drop to eye every 4 (four) hours as needed (dry eyes.)., Disp: , Rfl:    rosuvastatin (CRESTOR) 5 MG tablet, Take 1 tablet (5 mg total) by mouth daily., Disp: 30 tablet, Rfl: 11   vitamin C (ASCORBIC ACID) 500 MG tablet, Take 500 mg by mouth every morning., Disp: , Rfl:    vitamin E 180 MG (400 UNITS) capsule, Take 1 capsule (400 Units total) by mouth every morning., Disp: , Rfl:    zinc gluconate 50 MG tablet, Take 50 mg by mouth daily., Disp: , Rfl:   Past Medical History: Past Medical History:  Diagnosis Date   Arthritis    Asthma    CAD (coronary artery disease)    s/p CABG   Chronic kidney disease, stage 3 (HCC)    Dyslipidemia 05/13/2021   Headache    Hypercholesteremia    Hyperlipidemia    Osteopenia    Personal history of colonic polyps    Pleural effusion    recurrent    Tobacco Use: Social History   Tobacco Use  Smoking Status Never  Smokeless Tobacco Never    Labs: Recent Review Flowsheet Data     Labs for ITP Cardiac and Pulmonary Rehab Latest Ref Rng & Units  03/24/2021 03/24/2021 03/24/2021 03/25/2021 03/25/2021   Cholestrol 0 - 200 mg/dL - - - - -   LDLCALC 0 - 99 mg/dL - - - - -   HDL >40 mg/dL - - - - -   Trlycerides <150 mg/dL - - - - -   Hemoglobin A1c 4.8 - 5.6 % - - - - -   PHART 7.350 - 7.450 7.438 7.339(L) 7.356 7.389 7.364   PCO2ART 32.0 - 48.0 mmHg 39.3 43.6 41.3 33.6 38.8   HCO3 20.0 - 28.0 mmol/L 26.6 24.0 23.0 20.2 22.0   TCO2 22 - 32 mmol/L _0 21(L) 23   ACIDBASEDEF 0.0 - 2.0 mmol/L - 2.0 2.0 4.0(H) 3.0(H)   O2SAT % 100.0 99.0 99.0 99.0 98.0       Capillary Blood Glucose: Lab Results  Component Value Date   GLUCAP 136 (H) 03/26/2021   GLUCAP 111 (H) 03/26/2021   GLUCAP 110 (H) 03/26/2021   GLUCAP 135 (H) 03/25/2021   GLUCAP 145 (H) 03/25/2021     Exercise Target Goals: Exercise Program  Goal: Individual exercise prescription set using results from initial 6 min walk test and THRR while considering  patient's activity barriers and safety.   Exercise Prescription Goal: Starting with aerobic activity 30 plus minutes a day, 3 days per week for initial exercise prescription. Provide home exercise prescription and guidelines that participant acknowledges understanding prior to discharge.  Activity Barriers & Risk Stratification:  Activity Barriers & Cardiac Risk Stratification - 07/04/21 1412       Activity Barriers & Cardiac Risk Stratification   Activity Barriers Balance Concerns;Deconditioning;Shortness of Breath    Cardiac Risk Stratification High             6 Minute Walk:  6 Minute Walk     Row Name 07/04/21 1409         6 Minute Walk   Phase Initial     Distance 1200 feet     Walk Time 6 minutes     # of Rest Breaks 0     MPH 2.3     METS 2.18     RPE 11     Perceived Dyspnea  0.5     VO2 Peak 7.62     Symptoms Yes (comment)     Comments mild SOB pt felt was due to mask (RPD = 0.5)     Resting HR 74 bpm     Resting BP 128/80     Resting Oxygen Saturation  97 %     Exercise Oxygen Saturation  during 6 min walk 96 %     Max Ex. HR 97 bpm     Max Ex. BP 136/82     2 Minute Post BP 130/80              Oxygen Initial Assessment:   Oxygen Re-Evaluation:   Oxygen Discharge (Final Oxygen Re-Evaluation):   Initial Exercise Prescription:  Initial Exercise Prescription - 07/04/21 1400       Date of Initial Exercise RX and Referring Provider   Date 07/04/21    Referring Provider Larae Grooms, MD    Expected Discharge Date 07/04/21      NuStep   Level 2    SPM 75    Minutes 30    METs 2      Prescription Details   Frequency (times per week) 3    Duration Progress to 30 minutes of continuous aerobic without signs/symptoms of physical distress  Intensity   THRR 40-80% of Max Heartrate 56-111    Ratings of Perceived  Exertion 11-13    Perceived Dyspnea 0-4      Progression   Progression Continue progressive overload as per policy without signs/symptoms or physical distress.      Resistance Training   Training Prescription Yes    Weight 2 lbs    Reps 10-15             Perform Capillary Blood Glucose checks as needed.  Exercise Prescription Changes:   Exercise Prescription Changes     Row Name 07/12/21 1009             Response to Exercise   Blood Pressure (Admit) 118/62       Blood Pressure (Exercise) 138/83       Blood Pressure (Exit) 124/72       Heart Rate (Admit) 77 bpm       Heart Rate (Exercise) 87 bpm       Heart Rate (Exit) 78 bpm       Oxygen Saturation (Exit) 95 %       Rating of Perceived Exertion (Exercise) 11       Perceived Dyspnea (Exercise) 0       Symptoms 0       Comments Pt first day in the CRP2 program       Duration Continue with 30 min of aerobic exercise without signs/symptoms of physical distress.       Intensity THRR unchanged         Progression   Progression Continue to progress workloads to maintain intensity without signs/symptoms of physical distress.       Average METs 1.7         Resistance Training   Training Prescription No         NuStep   Level 2       SPM 75       Minutes 30       METs 1.7                Exercise Comments:   Exercise Comments     Row Name 07/12/21 1014           Exercise Comments Pt first day in the CRP2 program. Pt tolerated exercise well with an average MET level of 1.7. Pt is learing her THRR, RPE and exercise Rx. Pt is off to a good start                Exercise Goals and Review:   Exercise Goals     Row Name 07/04/21 1414             Exercise Goals   Increase Physical Activity Yes       Intervention Provide advice, education, support and counseling about physical activity/exercise needs.;Develop an individualized exercise prescription for aerobic and resistive training based on  initial evaluation findings, risk stratification, comorbidities and participant's personal goals.       Expected Outcomes Short Term: Attend rehab on a regular basis to increase amount of physical activity.;Long Term: Add in home exercise to make exercise part of routine and to increase amount of physical activity.;Long Term: Exercising regularly at least 3-5 days a week.       Increase Strength and Stamina Yes       Intervention Provide advice, education, support and counseling about physical activity/exercise needs.;Develop an individualized exercise prescription for aerobic and resistive training based on initial evaluation  findings, risk stratification, comorbidities and participant's personal goals.       Expected Outcomes Short Term: Increase workloads from initial exercise prescription for resistance, speed, and METs.;Short Term: Perform resistance training exercises routinely during rehab and add in resistance training at home;Long Term: Improve cardiorespiratory fitness, muscular endurance and strength as measured by increased METs and functional capacity (6MWT)       Able to understand and use rate of perceived exertion (RPE) scale Yes       Intervention Provide education and explanation on how to use RPE scale       Expected Outcomes Short Term: Able to use RPE daily in rehab to express subjective intensity level;Long Term:  Able to use RPE to guide intensity level when exercising independently       Knowledge and understanding of Target Heart Rate Range (THRR) Yes       Intervention Provide education and explanation of THRR including how the numbers were predicted and where they are located for reference       Expected Outcomes Short Term: Able to state/look up THRR;Long Term: Able to use THRR to govern intensity when exercising independently;Short Term: Able to use daily as guideline for intensity in rehab       Understanding of Exercise Prescription Yes       Intervention Provide education,  explanation, and written materials on patient's individual exercise prescription       Expected Outcomes Short Term: Able to explain program exercise prescription;Long Term: Able to explain home exercise prescription to exercise independently                Exercise Goals Re-Evaluation :  Exercise Goals Re-Evaluation     Row Name 07/12/21 1011             Exercise Goal Re-Evaluation   Exercise Goals Review Increase Physical Activity;Increase Strength and Stamina;Able to understand and use rate of perceived exertion (RPE) scale;Knowledge and understanding of Target Heart Rate Range (THRR);Understanding of Exercise Prescription       Comments Pt first day in the CRP2 program. Pt tolerated exercise well with an average MET level of 1.7. Pt is learing her THRR, RPE and exercise Rx. Pt is off to a good start       Expected Outcomes Will continue to monitor pt and progress workloads as tolerated without sign or symptom                 Discharge Exercise Prescription (Final Exercise Prescription Changes):  Exercise Prescription Changes - 07/12/21 1009       Response to Exercise   Blood Pressure (Admit) 118/62    Blood Pressure (Exercise) 138/83    Blood Pressure (Exit) 124/72    Heart Rate (Admit) 77 bpm    Heart Rate (Exercise) 87 bpm    Heart Rate (Exit) 78 bpm    Oxygen Saturation (Exit) 95 %    Rating of Perceived Exertion (Exercise) 11    Perceived Dyspnea (Exercise) 0    Symptoms 0    Comments Pt first day in the CRP2 program    Duration Continue with 30 min of aerobic exercise without signs/symptoms of physical distress.    Intensity THRR unchanged      Progression   Progression Continue to progress workloads to maintain intensity without signs/symptoms of physical distress.    Average METs 1.7      Resistance Training   Training Prescription No      NuStep   Level  2    SPM 75    Minutes 30    METs 1.7             Nutrition:  Target Goals:  Understanding of nutrition guidelines, daily intake of sodium <1561m, cholesterol <2089m calories 30% from fat and 7% or less from saturated fats, daily to have 5 or more servings of fruits and vegetables.  Biometrics:  Pre Biometrics - 07/04/21 1000       Pre Biometrics   Waist Circumference 36.5 inches    Hip Circumference 38.5 inches    Waist to Hip Ratio 0.95 %    Triceps Skinfold 22 mm    % Body Fat 37.5 %    Grip Strength 24 kg    Flexibility 12 in    Single Leg Stand 5.25 seconds              Nutrition Therapy Plan and Nutrition Goals:   Nutrition Assessments:  MEDIFICTS Score Key: ?70 Need to make dietary changes  40-70 Heart Healthy Diet ? 40 Therapeutic Level Cholesterol Diet   Picture Your Plate Scores: <4<23nhealthy dietary pattern with much room for improvement. 41-50 Dietary pattern unlikely to meet recommendations for good health and room for improvement. 51-60 More healthful dietary pattern, with some room for improvement.  >60 Healthy dietary pattern, although there may be some specific behaviors that could be improved.    Nutrition Goals Re-Evaluation:   Nutrition Goals Discharge (Final Nutrition Goals Re-Evaluation):   Psychosocial: Target Goals: Acknowledge presence or absence of significant depression and/or stress, maximize coping skills, provide positive support system. Participant is able to verbalize types and ability to use techniques and skills needed for reducing stress and depression.  Initial Review & Psychosocial Screening:  Initial Psych Review & Screening - 07/04/21 1532       Initial Review   Current issues with None Identified      Family Dynamics   Good Support System? Yes   TeDeneiceives alone. TeWadeas her daughter for support     Barriers   Psychosocial barriers to participate in program There are no identifiable barriers or psychosocial needs.             Quality of Life Scores:  Quality of Life -  07/04/21 1404       Quality of Life   Select Quality of Life      Quality of Life Scores   Health/Function Pre 21.14 %    Socioeconomic Pre 27.5 %    Psych/Spiritual Pre 28.43 %    Family Pre 30 %    GLOBAL Pre 25.23 %            Scores of 19 and below usually indicate a poorer quality of life in these areas.  A difference of  2-3 points is a clinically meaningful difference.  A difference of 2-3 points in the total score of the Quality of Life Index has been associated with significant improvement in overall quality of life, self-image, physical symptoms, and general health in studies assessing change in quality of life.  PHQ-9: Recent Review Flowsheet Data     [facility] 07/04/2021   Decreased Interest 0   Down, Depressed, Hopeless 0   PHQ - 2 Score 0      Interpretation of Total Score  Total Score Depression Severity:  1-4 = Minimal depression, 5-9 = Mild depression, 10-14 = Moderate depression, 15-19 = Moderately severe depression, 20-27 = Severe depression  Psychosocial Evaluation and Intervention:   Psychosocial Re-Evaluation:  Psychosocial Re-Evaluation     [place] Name 07/19/21 1026             Psychosocial Re-Evaluation   Current issues with None Identified       Comments Devlyn lost her husband two years ago and is stil mourning hisn loss. Hampton has tow children who live in the area and 4 grandchildren       Interventions Stress management education;Relaxation education;Encouraged to attend Cardiac Rehabilitation for the exercise       Continue Psychosocial Services  No Follow up required                Psychosocial Discharge (Final Psychosocial Re-Evaluation):  Psychosocial Re-Evaluation - 07/19/21 1026       Psychosocial Re-Evaluation   Current issues with None Identified    Comments Lore lost her husband two years ago and is stil mourning hisn loss. Rion has tow children who live in the area and 4 grandchildren     Interventions Stress management education;Relaxation education;Encouraged to attend Cardiac Rehabilitation for the exercise    Continue Psychosocial Services  No Follow up required             Vocational Rehabilitation: Provide vocational rehab assistance to qualifying candidates.   Vocational Rehab Evaluation & Intervention:  Vocational Rehab - 07/04/21 1534       Initial Vocational Rehab Evaluation & Intervention   Assessment shows need for Vocational Rehabilitation No   Deamber is retired and does not need vocational rehab a this time            Education: Education Goals: Education classes will be provided on a weekly basis, covering required topics. Participant will state understanding/return demonstration of topics presented.  Learning Barriers/Preferences:  Learning Barriers/Preferences - 07/04/21 1405       Learning Barriers/Preferences   Learning Barriers None    Learning Preferences Audio;Written Material;Computer/Internet;Group Instruction;Individual Instruction;Pictoral;Skilled Demonstration;Verbal Instruction;Video             Education Topics: Hypertension, Hypertension Reduction -Define heart disease and high blood pressure. Discus how high blood pressure affects the body and ways to reduce high blood pressure.   Exercise and Your Heart -Discuss why it is important to exercise, the FITT principles of exercise, normal and abnormal responses to exercise, and how to exercise safely.   Angina -Discuss definition of angina, causes of angina, treatment of angina, and how to decrease risk of having angina.   Cardiac Medications -Review what the following cardiac medications are used for, how they affect the body, and side effects that may occur when taking the medications.  Medications include Aspirin, Beta blockers, calcium channel blockers, ACE Inhibitors, angiotensin receptor blockers, diuretics, digoxin, and antihyperlipidemics.   Congestive Heart  Failure -Discuss the definition of CHF, how to live with CHF, the signs and symptoms of CHF, and how keep track of weight and sodium intake.   Heart Disease and Intimacy -Discus the effect sexual activity has on the heart, how changes occur during intimacy as we age, and safety during sexual activity.   Smoking Cessation / COPD -Discuss different methods to quit smoking, the health benefits of quitting smoking, and the definition of COPD.   Nutrition I: Fats -Discuss the types of cholesterol, what cholesterol does to the heart, and how cholesterol levels can be controlled.   Nutrition II: Labels -Discuss the different components of food labels and how to read food label   Heart Parts/Heart Disease  and PAD -Discuss the anatomy of the heart, the pathway of blood circulation through the heart, and these are affected by heart disease.   Stress I: Signs and Symptoms -Discuss the causes of stress, how stress may lead to anxiety and depression, and ways to limit stress.   Stress II: Relaxation -Discuss different types of relaxation techniques to limit stress.   Warning Signs of Stroke / TIA -Discuss definition of a stroke, what the signs and symptoms are of a stroke, and how to identify when someone is having stroke.   Knowledge Questionnaire Score:  Knowledge Questionnaire Score - 07/04/21 1405       Knowledge Questionnaire Score   Pre Score 20/24             Core Components/Risk Factors/Patient Goals at Admission:  Personal Goals and Risk Factors at Admission - 07/04/21 1406       Core Components/Risk Factors/Patient Goals on Admission    Weight Management Weight Maintenance    Hypertension Yes    Intervention Provide education on lifestyle modifcations including regular physical activity/exercise, weight management, moderate sodium restriction and increased consumption of fresh fruit, vegetables, and low fat dairy, alcohol moderation, and smoking cessation.;Monitor  prescription use compliance.    Expected Outcomes Short Term: Continued assessment and intervention until BP is < 140/33m HG in hypertensive participants. < 130/860mHG in hypertensive participants with diabetes, heart failure or chronic kidney disease.;Long Term: Maintenance of blood pressure at goal levels.    Lipids Yes    Intervention Provide education and support for participant on nutrition & aerobic/resistive exercise along with prescribed medications to achieve LDL <7034mHDL >76m73m  Expected Outcomes Short Term: Participant states understanding of desired cholesterol values and is compliant with medications prescribed. Participant is following exercise prescription and nutrition guidelines.;Long Term: Cholesterol controlled with medications as prescribed, with individualized exercise RX and with personalized nutrition plan. Value goals: LDL < 70mg71mL > 40 mg.    Stress Yes   Husband passed away in 2020 2020/02/29ing with closure of estate   Intervention Offer individual and/or small group education and counseling on adjustment to heart disease, stress management and health-related lifestyle change. Teach and support self-help strategies.;Refer participants experiencing significant psychosocial distress to appropriate mental health specialists for further evaluation and treatment. When possible, include family members and significant others in education/counseling sessions.    Expected Outcomes Short Term: Participant demonstrates changes in health-related behavior, relaxation and other stress management skills, ability to obtain effective social support, and compliance with psychotropic medications if prescribed.;Long Term: Emotional wellbeing is indicated by absence of clinically significant psychosocial distress or social isolation.             Core Components/Risk Factors/Patient Goals Review:   Goals and Risk Factor Review     Row Name 07/12/21 1052 07/19/21 1030           Core  Components/Risk Factors/Patient Goals Review   Personal Goals Review Weight Management/Obesity;Lipids;Stress;Hypertension Weight Management/Obesity;Lipids;Stress;Hypertension      Review TeresMarybellated exercise at cardiac rehab on 07/12/21 and did well with exercise. Vital signs stable. Oxygen saturation 955 on RA TeresGlynnaff to a good start to exercise at phase 2 cardiac. Vital signs and oxygen saturations have been stable.      Expected Outcomes TeresMirca continue to participate in phase 2 cardiac rehab for exercise, nutrition and lifeestyle modifications TeresAlani continue to participate in phase 2 cardiac rehab for exercise, nutrition and lifeestyle modifications  Core Components/Risk Factors/Patient Goals at Discharge (Final Review):   Goals and Risk Factor Review - 07/19/21 1030       Core Components/Risk Factors/Patient Goals Review   Personal Goals Review Weight Management/Obesity;Lipids;Stress;Hypertension    Review Weslynn is off to a good start to exercise at phase 2 cardiac. Vital signs and oxygen saturations have been stable.    Expected Outcomes Gwyn will continue to participate in phase 2 cardiac rehab for exercise, nutrition and lifeestyle modifications             ITP Comments:  ITP Comments     Row Name 07/04/21 1144 07/12/21 1050 07/19/21 1026       ITP Comments Dr Fransico Him MD, Medical Director 30 day ITP Review. Mabry started cardiac rehab on 07/12/21 and did well with exercise. 30 day ITP Review. Mairen is off to a good start to exercise at cardiac rehab.              Comments: See ITP comments.Barnet Pall, RN,BSN 07/19/2021 10:32 AM

## 2021-07-21 ENCOUNTER — Other Ambulatory Visit: Payer: Self-pay

## 2021-07-21 ENCOUNTER — Encounter (HOSPITAL_COMMUNITY)
Admission: RE | Admit: 2021-07-21 | Discharge: 2021-07-21 | Disposition: A | Discharge status: Home / Self Care | Payer: Medicare Other | Source: Ambulatory Visit | Attending: Interventional Cardiology | Admitting: Interventional Cardiology

## 2021-07-21 DIAGNOSIS — I214 Non-ST elevation (NSTEMI) myocardial infarction: Secondary | ICD-10-CM

## 2021-07-21 DIAGNOSIS — Z951 Presence of aortocoronary bypass graft: Secondary | ICD-10-CM

## 2021-07-24 ENCOUNTER — Encounter (HOSPITAL_COMMUNITY): Payer: Medicare Other

## 2021-07-26 ENCOUNTER — Encounter (HOSPITAL_COMMUNITY): Payer: Medicare Other

## 2021-07-26 ENCOUNTER — Telehealth (HOSPITAL_COMMUNITY): Payer: Self-pay | Admitting: Family Medicine

## 2021-07-28 ENCOUNTER — Encounter (HOSPITAL_COMMUNITY): Payer: Medicare Other

## 2021-07-28 ENCOUNTER — Telehealth (HOSPITAL_COMMUNITY): Payer: Self-pay | Admitting: Family Medicine

## 2021-07-31 ENCOUNTER — Other Ambulatory Visit: Payer: Self-pay

## 2021-07-31 ENCOUNTER — Encounter (HOSPITAL_COMMUNITY)
Admission: RE | Admit: 2021-07-31 | Discharge: 2021-07-31 | Disposition: A | Discharge status: Home / Self Care | Payer: Medicare Other | Source: Ambulatory Visit | Attending: Interventional Cardiology | Admitting: Interventional Cardiology

## 2021-07-31 DIAGNOSIS — I214 Non-ST elevation (NSTEMI) myocardial infarction: Secondary | ICD-10-CM | POA: Diagnosis not present

## 2021-07-31 DIAGNOSIS — Z951 Presence of aortocoronary bypass graft: Secondary | ICD-10-CM

## 2021-08-02 ENCOUNTER — Ambulatory Visit (INDEPENDENT_AMBULATORY_CARE_PROVIDER_SITE_OTHER): Payer: Medicare Other

## 2021-08-02 ENCOUNTER — Encounter: Payer: Self-pay | Admitting: Pulmonary Disease

## 2021-08-02 ENCOUNTER — Ambulatory Visit (INDEPENDENT_AMBULATORY_CARE_PROVIDER_SITE_OTHER): Payer: Medicare Other | Admitting: Pulmonary Disease

## 2021-08-02 ENCOUNTER — Encounter (HOSPITAL_COMMUNITY): Payer: Medicare Other

## 2021-08-02 ENCOUNTER — Encounter (HOSPITAL_COMMUNITY)
Admission: RE | Admit: 2021-08-02 | Discharge: 2021-08-02 | Disposition: A | Discharge status: Home / Self Care | Payer: Medicare Other | Source: Ambulatory Visit | Attending: Interventional Cardiology | Admitting: Interventional Cardiology

## 2021-08-02 ENCOUNTER — Other Ambulatory Visit: Payer: Self-pay

## 2021-08-02 VITALS — BP 128/66 | HR 84 | Temp 97.9°F | Ht 61.0 in | Wt 128.8 lb

## 2021-08-02 DIAGNOSIS — J9383 Other pneumothorax: Secondary | ICD-10-CM

## 2021-08-02 DIAGNOSIS — J90 Pleural effusion, not elsewhere classified: Secondary | ICD-10-CM

## 2021-08-02 DIAGNOSIS — R0981 Nasal congestion: Secondary | ICD-10-CM

## 2021-08-02 DIAGNOSIS — J9819 Other pulmonary collapse: Secondary | ICD-10-CM | POA: Diagnosis not present

## 2021-08-02 DIAGNOSIS — Z951 Presence of aortocoronary bypass graft: Secondary | ICD-10-CM

## 2021-08-02 DIAGNOSIS — I214 Non-ST elevation (NSTEMI) myocardial infarction: Secondary | ICD-10-CM

## 2021-08-02 DIAGNOSIS — T7840XD Allergy, unspecified, subsequent encounter: Secondary | ICD-10-CM

## 2021-08-02 NOTE — Patient Instructions (Signed)
Thank you for visiting Dr. Tonia Brooms at [facility]. Today we recommend the following:  Orders Placed This Encounter  Procedures   DG Chest 2 View   We will call you with CXR results.   Return in about 6 months (around 01/30/2022) for with APP or Dr. Tonia Brooms.    Please do your part to reduce the spread of COVID-19.

## 2021-08-02 NOTE — Progress Notes (Signed)
Synopsis: Referred in August 2022 for pleural effusion by Dr. Irving Copas, PCP: by Ileana Ladd, MD  Subjective:   PATIENT ID: Kimberly Calhoun: female DOB: [birth year], MRN: [id number]  Chief Complaint  Patient presents with   Follow-up    Patient wants to talk about getting an xray done    [name], PMH CAD s/p CABG, CKD, HLD. Presents to the office s/p cabg with recurrent left sided effusion. She was on eliquis that was held for todays office visit with plans for thoracentesis. From a respiratory standpoint she is feeling ok but she notices SOB and DOE at times.   OV 06/07/2021: Here today for evaluation of recurrent left-sided pleural effusion.  Patient unfortunately has had reaccumulation of this effusion and feels short of breath still.  Also has recently seen cardiology.  Office note reviewed.  Case discussed with Rikki Spearing, NP that saw patient in the office.  OV 08/02/2021: Here today for follow-up.  She states that she was out dealing with leaves working in her yard developed cough and shortness of breath.  She thought it was an reaction to her allergies.  She been doing okay since then but is taken some time for her to recover.  She feels much better than she did in the past.  Last chest x-ray we reviewed.  Showed ex vacuo pneumothorax after left-sided pleural effusion drained.  We talked about this today in the office.   Past Medical History:  Diagnosis Date   Arthritis    Asthma    CAD (coronary artery disease)    s/p CABG   Chronic kidney disease, stage 3 (HCC)    Dyslipidemia 05/13/2021   Headache    Hypercholesteremia    Hyperlipidemia    Osteopenia    Personal history of colonic polyps    Pleural effusion    recurrent     Family History  Problem Relation Age of Onset   Cancer - Colon Mother    Heart disease Father    Atrial fibrillation Sister    Rheum arthritis Sister    Diabetes Brother    Breast cancer Neg Hx      Past Surgical History:   Procedure Laterality Date   ABDOMINAL HYSTERECTOMY  02/15/2013   prolapse   CARDIAC CATHETERIZATION     COLONOSCOPY WITH PROPOFOL N/A 12/07/2014   Procedure: COLONOSCOPY WITH PROPOFOL;  Surgeon: Bernette Redbird, MD;  Location: [facility];  Service: Endoscopy;  Laterality: N/A;  ultraslim   CORONARY ARTERY BYPASS GRAFT N/A 03/24/2021   Procedure: CORONARY ARTERY BYPASS GRAFTING (CABG) TIMES THREE USING LEFT INTERNAL MAMMARY ARTERY AND RIGHT GREATER SAPHENOUS VEIN HARVESTED ENDOSCOPICALLY;  Surgeon: Linden Dolin, MD;  Location: [facility] OR;  Service: Open Heart Surgery;  Laterality: N/A;   IR THORACENTESIS ASP PLEURAL SPACE W/IMG GUIDE  04/17/2021   LEFT HEART CATH AND CORONARY ANGIOGRAPHY N/A 03/22/2021   Procedure: LEFT HEART CATH AND CORONARY ANGIOGRAPHY;  Surgeon: Lennette Bihari, MD;  Location: [facility];  Service: Cardiovascular;  Laterality: N/A;   TEE WITHOUT CARDIOVERSION N/A 03/24/2021   Procedure: TRANSESOPHAGEAL ECHOCARDIOGRAM (TEE);  Surgeon: Linden Dolin, MD;  Location: [facility];  Service: Open Heart Surgery;  Laterality: N/A;    Social History   Socioeconomic History   Marital status: Widowed    Spouse name: Not on file   Number of children: 1   Years of education: 14   Highest education level: Not on file  Occupational History  Occupation: retired  Tobacco Use   Smoking status: Never   Smokeless tobacco: Never  Vaping Use   Vaping Use: Never used  Substance and Sexual Activity   Alcohol use: No   Drug use: No   Sexual activity: Not Currently  Other Topics Concern   Not on file  Social History Narrative   Not on file   Social Determinants of Health   Financial Resource Strain: Not on file  Food Insecurity: Not on file  Transportation Needs: Not on file  Physical Activity: Not on file  Stress: Not on file  Social Connections: Not on file  Intimate Partner Violence: Not on file     Allergies  Allergen Reactions   Amiodarone Nausea And  Vomiting   Macrobid [Nitrofurantoin] Other (See Comments)    Cold chills, muscle weakness.   Mold Extract [Trichophyton] Other (See Comments)    Allergies.    Statins Other (See Comments)    Muscle pain   Other Other (See Comments)    Allergies.   Mold in Cheese - stuffy nose and headache  Chocolate- stuffy nose and headache  Mildew - stuffy nose and headache     Outpatient Medications Prior to Visit  Medication Sig Dispense Refill   acetaminophen (TYLENOL) 500 MG tablet Take 500-1,000 mg by mouth every 6 (six) hours as needed for moderate pain or headache.     albuterol (VENTOLIN HFA) 108 (90 Base) MCG/ACT inhaler Inhale 1 puff into the lungs every 6 (six) hours as needed for shortness of breath.     apixaban (ELIQUIS) 5 MG TABS tablet Take 1 tablet (5 mg total) by mouth 2 (two) times daily. 60 tablet 2   aspirin 81 MG EC tablet Take 81 mg by mouth daily. Swallow whole.     CALCIUM PO Take 1 tablet by mouth daily.     cetirizine (ZYRTEC) 10 MG tablet Take 10 mg by mouth daily as needed for allergies.     Cholecalciferol (VITAMIN D PO) Take 1 tablet by mouth every morning.     ezetimibe (ZETIA) 10 MG tablet Take 1 tablet (10 mg total) by mouth daily. 30 tablet 11   ferrous sulfate 325 (65 FE) MG EC tablet Take 1 tablet (325 mg total) by mouth daily with breakfast. 30 tablet 3   fluticasone (FLONASE) 50 MCG/ACT nasal spray Place 1 spray into both nostrils daily as needed for allergies.     metoprolol tartrate (LOPRESSOR) 25 MG tablet Take 3 tablets (75 mg total) by mouth 2 (two) times daily. 540 tablet 3   Multiple Vitamin (MULTIVITAMIN WITH MINERALS) TABS tablet Take 1 tablet by mouth at bedtime. Centrum silver.     Propylene Glycol (SYSTANE BALANCE OP) Apply 1 drop to eye every 4 (four) hours as needed (dry eyes.).     rosuvastatin (CRESTOR) 5 MG tablet Take 1 tablet (5 mg total) by mouth daily. 30 tablet 11   vitamin C (ASCORBIC ACID) 500 MG tablet Take 500 mg by mouth every morning.      vitamin E 180 MG (400 UNITS) capsule Take 1 capsule (400 Units total) by mouth every morning.     zinc gluconate 50 MG tablet Take 50 mg by mouth daily.     No facility-administered medications prior to visit.    Review of Systems  Constitutional:  Negative for chills, fever, malaise/fatigue and weight loss.  HENT:  Negative for hearing loss, sore throat and tinnitus.   Eyes:  Negative for blurred vision and  double vision.  Respiratory:  Positive for cough. Negative for hemoptysis, sputum production, shortness of breath, wheezing and stridor.   Cardiovascular:  Negative for chest pain, palpitations, orthopnea, leg swelling and PND.  Gastrointestinal:  Negative for abdominal pain, constipation, diarrhea, heartburn, nausea and vomiting.  Genitourinary:  Negative for dysuria, hematuria and urgency.  Musculoskeletal:  Negative for joint pain and myalgias.  Skin:  Negative for itching and rash.  Neurological:  Negative for dizziness, tingling, weakness and headaches.  Endo/Heme/Allergies:  Negative for environmental allergies. Does not bruise/bleed easily.  Psychiatric/Behavioral:  Negative for depression. The patient is not nervous/anxious and does not have insomnia.   All other systems reviewed and are negative.   Objective:  Physical Exam Vitals reviewed.  Constitutional:      General: She is not in acute distress.    Appearance: She is well-developed.  HENT:     Head: Normocephalic and atraumatic.  Eyes:     General: No scleral icterus.    Conjunctiva/sclera: Conjunctivae normal.     Pupils: Pupils are equal, round, and reactive to light.  Neck:     Vascular: No JVD.     Trachea: No tracheal deviation.  Cardiovascular:     Rate and Rhythm: Normal rate and regular rhythm.     Heart sounds: Normal heart sounds. No murmur heard. Pulmonary:     Effort: Pulmonary effort is normal. No tachypnea, accessory muscle usage or respiratory distress.     Breath sounds: No stridor. No  wheezing, rhonchi or rales.     Comments: Diminished breath sounds in the low left bases compared to the right. Abdominal:     General: Bowel sounds are normal. There is no distension.     Palpations: Abdomen is soft.     Tenderness: There is no abdominal tenderness.  Musculoskeletal:        General: No tenderness.     Cervical back: Neck supple.  Lymphadenopathy:     Cervical: No cervical adenopathy.  Skin:    General: Skin is warm and dry.     Capillary Refill: Capillary refill takes less than 2 seconds.     Findings: No rash.  Neurological:     Mental Status: She is alert and oriented to person, place, and time.  Psychiatric:        Behavior: Behavior normal.     Vitals:   08/02/21 1429  BP: 128/66  Pulse: 84  Temp: 97.9 F (36.6 C)  TempSrc: Oral  SpO2: 96%  Weight: 128 lb 12.8 oz (58.4 kg)  Height: 5\' 1"  (1.549 m)    96% on RA BMI Readings from Last 3 Encounters:  08/02/21 24.34 kg/m  07/05/21 23.19 kg/m  07/04/21 23.55 kg/m   Wt Readings from Last 3 Encounters:  08/02/21 128 lb 12.8 oz (58.4 kg)  07/05/21 126 lb 12.8 oz (57.5 kg)  07/04/21 128 lb 12 oz (58.4 kg)     CBC    Component Value Date/Time   WBC 9.4 07/05/2021 1159   WBC 12.5 (H) 06/15/2021 0739   RBC 4.17 07/05/2021 1159   RBC 4.11 07/05/2021 1159   HGB 11.8 (L) 07/05/2021 1159   HCT 36.7 07/05/2021 1159   PLT 453 (H) 07/05/2021 1159   MCV 88.0 07/05/2021 1159   MCH 28.3 07/05/2021 1159   MCHC 32.2 07/05/2021 1159   RDW 15.3 07/05/2021 1159   LYMPHSABS 1.6 07/05/2021 1159   MONOABS 0.9 07/05/2021 1159   EOSABS 0.2 07/05/2021 1159   BASOSABS 0.1  07/05/2021 1159     Chest Imaging: CXR 05/05/2021: Left sided effusion The patient's images have been independently reviewed by me.    Pulmonary Functions Testing Results: No flowsheet data found.  FeNO:   Pathology:   Echocardiogram:   Heart Catheterization:     Assessment & Plan:     ICD-10-CM   1. Nasal congestion   R09.81     2. Allergy, subsequent encounter  T78.40XD     3. Pleural effusion, left  J90 DG Chest 2 View    4. S/P CABG (coronary artery bypass graft)  Z95.1 DG Chest 2 View    5. 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  History of atrial fibrillation on oral anticoagulation  Plan: Repeat chest x-ray today Her recent change in respiratory symptoms she thinks was related to allergies. This is better controlled she is improving. Will call her and let her know what her chest x-ray looks like.    Current Outpatient Medications:    acetaminophen (TYLENOL) 500 MG tablet, Take 500-1,000 mg by mouth every 6 (six) hours as needed for moderate pain or headache., Disp: , Rfl:    albuterol (VENTOLIN HFA) 108 (90 Base) MCG/ACT inhaler, Inhale 1 puff into the lungs every 6 (six) hours as needed for shortness of breath., Disp: , Rfl:    apixaban (ELIQUIS) 5 MG TABS tablet, Take 1 tablet (5 mg total) by mouth 2 (two) times daily., Disp: 60 tablet, Rfl: 2   aspirin 81 MG EC tablet, Take 81 mg by mouth daily. Swallow whole., Disp: , Rfl:    CALCIUM PO, Take 1 tablet by mouth daily., Disp: , Rfl:    cetirizine (ZYRTEC) 10 MG tablet, Take 10 mg by mouth daily as needed for allergies., Disp: , Rfl:    Cholecalciferol (VITAMIN D PO), Take 1 tablet by mouth every morning., Disp: , Rfl:    ezetimibe (ZETIA) 10 MG tablet, Take 1 tablet (10 mg total) by mouth daily., Disp: 30 tablet, Rfl: 11   ferrous sulfate 325 (65 FE) MG EC tablet, Take 1 tablet (325 mg total) by mouth daily with breakfast., Disp: 30 tablet, Rfl: 3   fluticasone (FLONASE) 50 MCG/ACT nasal spray, Place 1 spray into both nostrils daily as needed for allergies., Disp: , Rfl:    metoprolol tartrate  (LOPRESSOR) 25 MG tablet, Take 3 tablets (75 mg total) by mouth 2 (two) times daily., Disp: 540 tablet, Rfl: 3   Multiple Vitamin (MULTIVITAMIN WITH MINERALS) TABS tablet, Take 1 tablet by mouth at bedtime. Centrum silver., Disp: , Rfl:    Propylene Glycol (SYSTANE BALANCE OP), Apply 1 drop to eye every 4 (four) hours as needed (dry eyes.)., Disp: , Rfl:    rosuvastatin (CRESTOR) 5 MG tablet, Take 1 tablet (5 mg total) by mouth daily., Disp: 30 tablet, Rfl: 11   vitamin C (ASCORBIC ACID) 500 MG tablet, Take 500 mg by mouth every morning., Disp: , Rfl:    vitamin E 180 MG (400 UNITS) capsule, Take 1 capsule (400 Units total) by mouth every morning., Disp: , Rfl:    zinc gluconate 50 MG tablet, Take 50 mg by mouth daily., Disp: , Rfl:    Kimberly Hao L Kemal Amores, DO  [facility] 08/02/2021 2:43 PM

## 2021-08-03 NOTE — Progress Notes (Signed)
Please let patient know her CXR looks stable. Effusion is smaller   Thanks,  BLI  Josephine Igo, DO [facility] 08/03/2021 5:18 PM

## 2021-08-04 ENCOUNTER — Telehealth (HOSPITAL_COMMUNITY): Payer: Self-pay | Admitting: Family Medicine

## 2021-08-04 ENCOUNTER — Encounter (HOSPITAL_COMMUNITY): Payer: Medicare Other

## 2021-08-07 ENCOUNTER — Other Ambulatory Visit: Payer: Self-pay

## 2021-08-07 ENCOUNTER — Encounter (HOSPITAL_COMMUNITY)
Admission: RE | Admit: 2021-08-07 | Discharge: 2021-08-07 | Disposition: A | Discharge status: Home / Self Care | Payer: Medicare Other | Source: Ambulatory Visit | Attending: Interventional Cardiology | Admitting: Interventional Cardiology

## 2021-08-07 ENCOUNTER — Encounter (HOSPITAL_COMMUNITY): Payer: Medicare Other

## 2021-08-07 DIAGNOSIS — I214 Non-ST elevation (NSTEMI) myocardial infarction: Secondary | ICD-10-CM

## 2021-08-07 DIAGNOSIS — Z951 Presence of aortocoronary bypass graft: Secondary | ICD-10-CM

## 2021-08-09 ENCOUNTER — Encounter (HOSPITAL_COMMUNITY): Payer: Medicare Other

## 2021-08-09 ENCOUNTER — Telehealth (HOSPITAL_COMMUNITY): Payer: Self-pay | Admitting: Family Medicine

## 2021-08-11 ENCOUNTER — Encounter (HOSPITAL_COMMUNITY): Payer: Medicare Other

## 2021-08-14 ENCOUNTER — Other Ambulatory Visit: Payer: Medicare Other | Admitting: *Deleted

## 2021-08-14 ENCOUNTER — Other Ambulatory Visit: Payer: Self-pay

## 2021-08-14 ENCOUNTER — Encounter (HOSPITAL_COMMUNITY): Payer: Medicare Other

## 2021-08-14 ENCOUNTER — Other Ambulatory Visit: Payer: Self-pay | Admitting: Family Medicine

## 2021-08-14 ENCOUNTER — Encounter (HOSPITAL_COMMUNITY)
Admission: RE | Admit: 2021-08-14 | Discharge: 2021-08-14 | Disposition: A | Discharge status: Home / Self Care | Payer: Medicare Other | Source: Ambulatory Visit | Attending: Interventional Cardiology | Admitting: Interventional Cardiology

## 2021-08-14 DIAGNOSIS — Z1231 Encounter for screening mammogram for malignant neoplasm of breast: Secondary | ICD-10-CM

## 2021-08-14 DIAGNOSIS — I214 Non-ST elevation (NSTEMI) myocardial infarction: Secondary | ICD-10-CM

## 2021-08-14 DIAGNOSIS — Z951 Presence of aortocoronary bypass graft: Secondary | ICD-10-CM

## 2021-08-14 DIAGNOSIS — E785 Hyperlipidemia, unspecified: Secondary | ICD-10-CM

## 2021-08-14 DIAGNOSIS — M810 Age-related osteoporosis without current pathological fracture: Secondary | ICD-10-CM

## 2021-08-14 LAB — HEPATIC FUNCTION PANEL
ALT: 30 IU/L (ref 0–32)
AST: 25 IU/L (ref 0–40)
Albumin: 4.1 g/dL (ref 3.6–4.6)
Alkaline Phosphatase: 181 IU/L — ABNORMAL HIGH (ref 44–121)
Bilirubin Total: 0.2 mg/dL (ref 0.0–1.2)
Bilirubin, Direct: <0.1 mg/dL (ref 0.00–0.40)
Total Protein: 6.6 g/dL (ref 6.0–8.5)

## 2021-08-14 LAB — LIPID PANEL
Chol/HDL Ratio: 4.1 ratio (ref 0.0–4.4)
Cholesterol, Total: 209 mg/dL — ABNORMAL HIGH (ref 100–199)
HDL: 51 mg/dL (ref >39)
LDL Chol Calc (NIH): 135 mg/dL — ABNORMAL HIGH (ref 0–99)
Triglycerides: 128 mg/dL (ref 0–149)
VLDL Cholesterol Cal: 23 mg/dL (ref 5–40)

## 2021-08-14 NOTE — Progress Notes (Signed)
Reviewed home exercise guidelines with patient including endpoints, temperature precautions, target heart rate and rate of perceived exertion. Patient is currently walking 10 minutes daily as her mode of home exercise. Discussed increasing duration, and patient is amenable to this. Patient voices understanding of instructions given.  Artist Pais, MS, ACSM CEP

## 2021-08-14 NOTE — Progress Notes (Signed)
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 Swallow whole., Disp: , Rfl:    CALCIUM PO, Take 1 tablet by mouth daily., Disp: , Rfl:    cetirizine (ZYRTEC) 10 MG tablet, Take 10 mg by mouth daily as needed for allergies., Disp: , Rfl:    Cholecalciferol (VITAMIN D PO), Take 1 tablet by mouth every morning., Disp: , Rfl:    ezetimibe (ZETIA) 10 MG tablet, Take 1 tablet (10 mg total) by mouth daily., Disp: 30 tablet, Rfl: 11   ferrous sulfate 325 (65 FE) MG EC tablet, Take 1 tablet (325 mg total) by mouth daily with breakfast., Disp: 30 tablet, Rfl: 3   fluticasone (FLONASE) 50 MCG/ACT nasal spray, Place 1 spray into both nostrils daily as needed for allergies., Disp: , Rfl:    metoprolol tartrate (LOPRESSOR) 25 MG tablet, Take 3 tablets (75 mg total) by mouth 2 (two) times daily., Disp: 540 tablet, Rfl: 3   Multiple Vitamin (MULTIVITAMIN WITH MINERALS) TABS tablet, Take 1 tablet by mouth  at bedtime. Centrum silver., Disp: , Rfl:    Propylene Glycol (SYSTANE BALANCE OP), Apply 1 drop to eye every 4 (four) hours as needed (dry eyes.)., Disp: , Rfl:    rosuvastatin (CRESTOR) 5 MG tablet, Take 1 tablet (5 mg total) by mouth daily., Disp: 30 tablet, Rfl: 11   vitamin C (ASCORBIC ACID) 500 MG tablet, Take 500 mg by mouth every morning., Disp: , Rfl:    vitamin E 180 MG (400 UNITS) capsule, Take 1 capsule (400 Units total) by mouth every morning., Disp: , Rfl:    zinc gluconate 50 MG tablet, Take 50 mg by mouth daily., Disp: , Rfl:   Past Medical History: Past Medical History:  Diagnosis Date   Arthritis    Asthma    CAD (coronary artery disease)    s/p CABG   Chronic kidney disease, stage 3 (HCC)    Dyslipidemia 05/13/2021   Headache    Hypercholesteremia    Hyperlipidemia    Osteopenia    Personal history of colonic polyps    Pleural effusion    recurrent    Tobacco Use: Social History   Tobacco Use  Smoking Status Never  Smokeless Tobacco Never    Labs: Recent Review Flowsheet Data     Labs for ITP Cardiac and Pulmonary Rehab Latest Ref Rng & Units 03/24/2021 03/24/2021 03/25/2021 03/25/2021 08/14/2021  Cholestrol 100 - 199 mg/dL - - - - 209(H)   LDLCALC 0 - 99 mg/dL - - - - 135(H)   HDL >39 mg/dL - - - - 51   Trlycerides 0 - 149 mg/dL - - - - 128   Hemoglobin A1c 4.8 - 5.6 % - - - - -   PHART 7.350 - 7.450 7.339(L) 7.356 7.389 7.364 -   PCO2ART 32.0 - 48.0 mmHg 43.6 41.3 33.6 38.8 -   HCO3 20.0 - 28.0 mmol/L 24.0 23.0 20.2 22.0 -   TCO2 22 - 32 mmol/L 25 24 21(L) 23 -   ACIDBASEDEF 0.0 - 2.0 mmol/L 2.0 2.0 4.0(H) 3.0(H) -   O2SAT % 99.0 99.0 99.0 98.0 -       Capillary Blood Glucose: Lab Results  Component Value Date   GLUCAP 136 (H) 03/26/2021   GLUCAP 111 (H) 03/26/2021   GLUCAP 110 (H) 03/26/2021   GLUCAP 135 (H) 03/25/2021   GLUCAP 145 (H) 03/25/2021     Exercise Target Goals: Exercise Program Goal: Individual exercise prescription set using  results from initial 6 min walk test and THRR while considering  patient's activity barriers and safety.   Exercise Prescription Goal: Initial exercise prescription builds to 30-45 minutes a day of aerobic activity, 2-3 days per week.  Home exercise guidelines will be given to patient during program as part of exercise prescription that the participant will acknowledge.  Activity Barriers & Risk Stratification:  Activity Barriers & Cardiac Risk Stratification - 07/04/21 1412       Activity Barriers & Cardiac Risk Stratification   Activity Barriers Balance Concerns;Deconditioning;Shortness of Breath    Cardiac Risk Stratification High             6 Minute Walk:  6 Minute Walk     Row Name 07/04/21 1409         6 Minute Walk   Phase Initial     Distance 1200 feet     Walk Time 6 minutes     # of Rest Breaks 0     MPH 2.3     METS 2.18     RPE 11     Perceived Dyspnea  0.5     VO2 Peak 7.62     Symptoms Yes (comment)     Comments mild SOB pt felt was due to mask (RPD = 0.5)     Resting HR 74 bpm     Resting BP 128/80     Resting Oxygen Saturation  97 %     Exercise Oxygen Saturation  during 6 min walk 96 %     Max Ex. HR 97 bpm     Max Ex. BP 136/82     2 Minute Post BP 130/80              Oxygen Initial Assessment:   Oxygen Re-Evaluation:   Oxygen Discharge (Final Oxygen Re-Evaluation):   Initial Exercise Prescription:  Initial Exercise Prescription - 07/04/21 1400       Date of Initial Exercise RX and Referring Provider   Date 07/04/21    Referring Provider Larae Grooms, MD    Expected Discharge Date 07/04/21      NuStep   Level 2    SPM 75    Minutes 30    METs 2      Prescription Details   Frequency (times per week) 3    Duration Progress to 30 minutes of continuous aerobic without signs/symptoms of  physical distress      Intensity   THRR 40-80% of Max Heartrate 56-111    Ratings of Perceived Exertion 11-13    Perceived Dyspnea  0-4      Progression   Progression Continue progressive overload as per policy without signs/symptoms or physical distress.      Resistance Training   Training Prescription Yes    Weight 2 lbs    Reps 10-15             Perform Capillary Blood Glucose checks as needed.  Exercise Prescription Changes:   Exercise Prescription Changes     Row Name 07/12/21 1009 07/31/21 1036 08/14/21 1040         Response to Exercise   Blood Pressure (Admit) 118/62 138/70 122/58     Blood Pressure (Exercise) 138/83 143/81 128/60     Blood Pressure (Exit) 124/72 104/72 102/62     Heart Rate (Admit) 77 bpm 67 bpm 75 bpm     Heart Rate (Exercise) 87 bpm 94 bpm 96 bpm     Heart Rate (Exit) 78 bpm 74 bpm 75 bpm     Oxygen Saturation (Exercise) -- 97 % 97 %     Oxygen Saturation (Exit) 95 % -- --     Rating of Perceived Exertion (Exercise) 11 11 12      Perceived Dyspnea (Exercise) 0 0 0     Symptoms 0 None None     Comments Pt first day in the CRP2 program -- --     Duration Continue with 30 min of aerobic exercise without signs/symptoms of physical distress. Continue with 30 min of aerobic exercise without signs/symptoms of physical distress. Continue with 30 min of aerobic exercise without signs/symptoms of physical distress.     Intensity THRR unchanged THRR unchanged THRR unchanged       Progression   Progression Continue to progress workloads to maintain intensity without signs/symptoms of physical distress. Continue to progress workloads to maintain intensity without signs/symptoms of physical distress. Continue to progress workloads to maintain intensity without signs/symptoms of physical distress.     Average METs 1.7 2.4 2.4       Resistance Training   Training Prescription No Yes Yes     Weight -- 2 lbs 2 lbs     Reps -- 10-15 10-15     Time -- 10 Minutes 10 Minutes       NuStep   Level 2 2 2      SPM 75 85 85     Minutes 30 30 30      METs 1.7 2.4 2.4       Home Exercise Plan    Plans to continue exercise at -- -- Home (comment)  Walking     Frequency -- -- Add 4 additional days to program exercise sessions.     Initial Home Exercises Provided -- -- 08/14/21              Exercise Comments:   Exercise Comments     Row Name 07/12/21 1014 08/02/21 1100 08/14/21 1100       Exercise Comments Pt first day in the CRP2 program. Pt tolerated exercise well with an average MET level of 1.7. Pt is learing her THRR, RPE and exercise Rx. Pt is off to a good start Patient would like to add another modality to current exercise routine at cardiac rehab. Will try upright or recumbent bike at next exercise session. Reviewed home exercise guidelines, METs, and goals with  patient.              Exercise Goals and Review:   Exercise Goals     Row Name 07/04/21 1414             Exercise Goals   Increase Physical Activity Yes       Intervention Provide advice, education, support and counseling about physical activity/exercise needs.;Develop an individualized exercise prescription for aerobic and resistive training based on initial evaluation findings, risk stratification, comorbidities and participant's personal goals.       Expected Outcomes Short Term: Attend rehab on a regular basis to increase amount of physical activity.;Long Term: Add in home exercise to make exercise part of routine and to increase amount of physical activity.;Long Term: Exercising regularly at least 3-5 days a week.       Increase Strength and Stamina Yes       Intervention Provide advice, education, support and counseling about physical activity/exercise needs.;Develop an individualized exercise prescription for aerobic and resistive training based on initial evaluation findings, risk stratification, comorbidities and participant's personal goals.       Expected Outcomes Short Term: Increase workloads from initial exercise prescription for resistance, speed, and METs.;Short Term: Perform  resistance training exercises routinely during rehab and add in resistance training at home;Long Term: Improve cardiorespiratory fitness, muscular endurance and strength as measured by increased METs and functional capacity (6MWT)       Able to understand and use rate of perceived exertion (RPE) scale Yes       Intervention Provide education and explanation on how to use RPE scale       Expected Outcomes Short Term: Able to use RPE daily in rehab to express subjective intensity level;Long Term:  Able to use RPE to guide intensity level when exercising independently       Knowledge and understanding of Target Heart Rate Range (THRR) Yes       Intervention Provide education and explanation of THRR including how the numbers were predicted and where they are located for reference       Expected Outcomes Short Term: Able to state/look up THRR;Long Term: Able to use THRR to govern intensity when exercising independently;Short Term: Able to use daily as guideline for intensity in rehab       Understanding of Exercise Prescription Yes       Intervention Provide education, explanation, and written materials on patient's individual exercise prescription       Expected Outcomes Short Term: Able to explain program exercise prescription;Long Term: Able to explain home exercise prescription to exercise independently                Exercise Goals Re-Evaluation :  Exercise Goals Re-Evaluation     Row Name 07/12/21 1011 08/02/21 1100 08/14/21 1100         Exercise Goal Re-Evaluation   Exercise Goals Review Increase Physical Activity;Increase Strength and Stamina;Able to understand and use rate of perceived exertion (RPE) scale;Knowledge and understanding of Target Heart Rate Range (THRR);Understanding of Exercise Prescription Increase Physical Activity;Increase Strength and Stamina;Able to understand and use rate of perceived exertion (RPE) scale;Knowledge and understanding of Target Heart Rate Range  (THRR);Understanding of Exercise Prescription Increase Physical Activity;Increase Strength and Stamina;Able to understand and use rate of perceived exertion (RPE) scale;Knowledge and understanding of Target Heart Rate Range (THRR);Understanding of Exercise Prescription     Comments Pt first day in the CRP2 program. Pt tolerated exercise well with an average MET level of 1.7.  Pt is learing her THRR, RPE and exercise Rx. Pt is off to a good start Patient is interested in trying different equipment. Will look at adding upright or recumbent bike to patient's exercise routine. Reviewed exercise prescription with patient, and patient is currently walking 10 minutes daily as her mode of exercise. Discussed increasing duration and patient is amenable to this. Patient has a smart watch to check her pulse. Patient's goal is to decrease the amount of medications she's taking and to be able to lift more than a jug of milk. I advised patient to contact her cardiologist's office regarding lifting restrictions. Will increase weights from 2 lbs to 3 lbs at next session. Added recumbent bike to patient's exercise routine last week, and she tolerated it well.     Expected Outcomes Will continue to monitor pt and progress workloads as tolerated without sign or symptom Will add bike to exercise routine at cardiac rehab and progress workloads as tolerated. Patient will increase hand weights from 2 to 3 lbs to help build strength.              Discharge Exercise Prescription (Final Exercise Prescription Changes):  Exercise Prescription Changes - 08/14/21 1040       Response to Exercise   Blood Pressure (Admit) 122/58    Blood Pressure (Exercise) 128/60    Blood Pressure (Exit) 102/62    Heart Rate (Admit) 75 bpm    Heart Rate (Exercise) 96 bpm    Heart Rate (Exit) 75 bpm    Oxygen Saturation (Exercise) 97 %    Rating of Perceived Exertion (Exercise) 12    Perceived Dyspnea (Exercise) 0    Symptoms None     Duration Continue with 30 min of aerobic exercise without signs/symptoms of physical distress.    Intensity THRR unchanged      Progression   Progression Continue to progress workloads to maintain intensity without signs/symptoms of physical distress.    Average METs 2.4      Resistance Training   Training Prescription Yes    Weight 2 lbs    Reps 10-15    Time 10 Minutes      NuStep   Level 2    SPM 85    Minutes 30    METs 2.4      Home Exercise Plan   Plans to continue exercise at Home (comment)   Walking   Frequency Add 4 additional days to program exercise sessions.    Initial Home Exercises Provided 08/14/21             Nutrition:  Target Goals: Understanding of nutrition guidelines, daily intake of sodium <156m, cholesterol <2023m calories 30% from fat and 7% or less from saturated fats, daily to have 5 or more servings of fruits and vegetables.  Biometrics:  Pre Biometrics - 07/04/21 1000       Pre Biometrics   Waist Circumference 36.5 inches    Hip Circumference 38.5 inches    Waist to Hip Ratio 0.95 %    Triceps Skinfold 22 mm    % Body Fat 37.5 %    Grip Strength 24 kg    Flexibility 12 in    Single Leg Stand 5.25 seconds              Nutrition Therapy Plan and Nutrition Goals:   Nutrition Assessments:  MEDIFICTS Score Key: ?70 Need to make dietary changes  40-70 Heart Healthy Diet ? 40 Therapeutic Level Cholesterol Diet  Picture Your Plate Scores: <56 Unhealthy dietary pattern with much room for improvement. 41-50 Dietary pattern unlikely to meet recommendations for good health and room for improvement. 51-60 More healthful dietary pattern, with some room for improvement.  >60 Healthy dietary pattern, although there may be some specific behaviors that could be improved.    Nutrition Goals Re-Evaluation:   Nutrition Goals Re-Evaluation:   Nutrition Goals Discharge (Final Nutrition Goals  Re-Evaluation):   Psychosocial: Target Goals: Acknowledge presence or absence of significant depression and/or stress, maximize coping skills, provide positive support system. Participant is able to verbalize types and ability to use techniques and skills needed for reducing stress and depression.  Initial Review & Psychosocial Screening:  Initial Psych Review & Screening - 07/04/21 1532       Initial Review   Current issues with None Identified      Family Dynamics   Good Support System? Yes   Levenia lives alone. Lajuana has her daughter for support     Barriers   Psychosocial barriers to participate in program There are no identifiable barriers or psychosocial needs.             Quality of Life Scores:  Quality of Life - 07/04/21 1404       Quality of Life   Select Quality of Life      Quality of Life Scores   Health/Function Pre 21.14 %    Socioeconomic Pre 27.5 %    Psych/Spiritual Pre 28.43 %    Family Pre 30 %    GLOBAL Pre 25.23 %            Scores of 19 and below usually indicate a poorer quality of life in these areas.  A difference of  2-3 points is a clinically meaningful difference.  A difference of 2-3 points in the total score of the Quality of Life Index has been associated with significant improvement in overall quality of life, self-image, physical symptoms, and general health in studies assessing change in quality of life.  PHQ-9: Recent Review Flowsheet Data     Depression screen [facility] 2/9 07/04/2021   Decreased Interest 0   Down, Depressed, Hopeless 0   PHQ - 2 Score 0      Interpretation of Total Score  Total Score Depression Severity:  1-4 = Minimal depression, 5-9 = Mild depression, 10-14 = Moderate depression, 15-19 = Moderately severe depression, 20-27 = Severe depression   Psychosocial Evaluation and Intervention:   Psychosocial Re-Evaluation:  Psychosocial Re-Evaluation     [place] Name 07/19/21 1026 08/14/21 1656            Psychosocial Re-Evaluation   Current issues with None Identified None Identified      Comments Cheryn lost her husband two years ago and is stil mourning hisn loss. Indiyah has tow children who live in the area and 4 grandchildren Zyla has not voiced any increased concerns or stressors.      Interventions Stress management education;Relaxation education;Encouraged to attend Cardiac Rehabilitation for the exercise Stress management education;Relaxation education;Encouraged to attend Cardiac Rehabilitation for the exercise      Continue Psychosocial Services  No Follow up required No Follow up required               Psychosocial Discharge (Final Psychosocial Re-Evaluation):  Psychosocial Re-Evaluation - 08/14/21 1656       Psychosocial Re-Evaluation   Current issues with None Identified    Comments Niurka has not voiced any increased concerns or  stressors.    Interventions Stress management education;Relaxation education;Encouraged to attend Cardiac Rehabilitation for the exercise    Continue Psychosocial Services  No Follow up required             Vocational Rehabilitation: Provide vocational rehab assistance to qualifying candidates.   Vocational Rehab Evaluation & Intervention:  Vocational Rehab - 07/04/21 1534       Initial Vocational Rehab Evaluation & Intervention   Assessment shows need for Vocational Rehabilitation No   Caprina is retired and does not need vocational rehab a this time            Education: Education Goals: Education classes will be provided on a weekly basis, covering required topics. Participant will state understanding/return demonstration of topics presented.  Learning Barriers/Preferences:  Learning Barriers/Preferences - 07/04/21 1405       Learning Barriers/Preferences   Learning Barriers None    Learning Preferences Audio;Written Material;Computer/Internet;Group Instruction;Individual Instruction;Pictoral;Skilled Demonstration;Verbal  Instruction;Video             Education Topics: Count Your Pulse:  -Group instruction provided by verbal instruction, demonstration, patient participation and written materials to support subject.  Instructors address importance of being able to find your pulse and how to count your pulse when at home without a heart monitor.  Patients get hands on experience counting their pulse with staff help and individually.   Heart Attack, Angina, and Risk Factor Modification:  -Group instruction provided by verbal instruction, video, and written materials to support subject.  Instructors address signs and symptoms of angina and heart attacks.    Also discuss risk factors for heart disease and how to make changes to improve heart health risk factors.   Functional Fitness:  -Group instruction provided by verbal instruction, demonstration, patient participation, and written materials to support subject.  Instructors address safety measures for doing things around the house.  Discuss how to get up and down off the floor, how to pick things up properly, how to safely get out of a chair without assistance, and balance training.   Meditation and Mindfulness:  -Group instruction provided by verbal instruction, patient participation, and written materials to support subject.  Instructor addresses importance of mindfulness and meditation practice to help reduce stress and improve awareness.  Instructor also leads participants through a meditation exercise.    Stretching for Flexibility and Mobility:  -Group instruction provided by verbal instruction, patient participation, and written materials to support subject.  Instructors lead participants through series of stretches that are designed to increase flexibility thus improving mobility.  These stretches are additional exercise for major muscle groups that are typically performed during regular warm up and cool down.   Hands Only CPR:  -Group verbal,  video, and participation provides a basic overview of AHA guidelines for community CPR. Role-play of emergencies allow participants the opportunity to practice calling for help and chest compression technique with discussion of AED use.   Hypertension: -Group verbal and written instruction that provides a basic overview of hypertension including the most recent diagnostic guidelines, risk factor reduction with self-care instructions and medication management.    Nutrition I class: Heart Healthy Eating:  -Group instruction provided by PowerPoint slides, verbal discussion, and written materials to support subject matter. The instructor gives an explanation and review of the Therapeutic Lifestyle Changes diet recommendations, which includes a discussion on lipid goals, dietary fat, sodium, fiber, plant stanol/sterol esters, sugar, and the components of a well-balanced, healthy diet.   Nutrition II class: Lifestyle Skills:  -  Group instruction provided by PowerPoint slides, verbal discussion, and written materials to support subject matter. The instructor gives an explanation and review of label reading, grocery shopping for heart health, heart healthy recipe modifications, and ways to make healthier choices when eating out.   Diabetes Question & Answer:  -Group instruction provided by PowerPoint slides, verbal discussion, and written materials to support subject matter. The instructor gives an explanation and review of diabetes co-morbidities, pre- and post-prandial blood glucose goals, pre-exercise blood glucose goals, signs, symptoms, and treatment of hypoglycemia and hyperglycemia, and foot care basics.   Diabetes Blitz:  -Group instruction provided by PowerPoint slides, verbal discussion, and written materials to support subject matter. The instructor gives an explanation and review of the physiology behind type 1 and type 2 diabetes, diabetes medications and rational behind using different  medications, pre- and post-prandial blood glucose recommendations and Hemoglobin A1c goals, diabetes diet, and exercise including blood glucose guidelines for exercising safely.    Portion Distortion:  -Group instruction provided by PowerPoint slides, verbal discussion, written materials, and food models to support subject matter. The instructor gives an explanation of serving size versus portion size, changes in portions sizes over the last 20 years, and what consists of a serving from each food group.   Stress Management:  -Group instruction provided by verbal instruction, video, and written materials to support subject matter.  Instructors review role of stress in heart disease and how to cope with stress positively.     Exercising on Your Own:  -Group instruction provided by verbal instruction, power point, and written materials to support subject.  Instructors discuss benefits of exercise, components of exercise, frequency and intensity of exercise, and end points for exercise.  Also discuss use of nitroglycerin and activating EMS.  Review options of places to exercise outside of rehab.  Review guidelines for sex with heart disease.   Cardiac Drugs I:  -Group instruction provided by verbal instruction and written materials to support subject.  Instructor reviews cardiac drug classes: antiplatelets, anticoagulants, beta blockers, and statins.  Instructor discusses reasons, side effects, and lifestyle considerations for each drug class.   Cardiac Drugs II:  -Group instruction provided by verbal instruction and written materials to support subject.  Instructor reviews cardiac drug classes: angiotensin converting enzyme inhibitors (ACE-I), angiotensin II receptor blockers (ARBs), nitrates, and calcium channel blockers.  Instructor discusses reasons, side effects, and lifestyle considerations for each drug class.   Anatomy and Physiology of the Circulatory System:  Group verbal and written  instruction and models provide basic cardiac anatomy and physiology, with the coronary electrical and arterial systems. Review of: AMI, Angina, Valve disease, Heart Failure, Peripheral Artery Disease, Cardiac Arrhythmia, Pacemakers, and the ICD.   Other Education:  -Group or individual verbal, written, or video instructions that support the educational goals of the cardiac rehab program.   Holiday Eating Survival Tips:  -Group instruction provided by PowerPoint slides, verbal discussion, and written materials to support subject matter. The instructor gives patients tips, tricks, and techniques to help them not only survive but enjoy the holidays despite the onslaught of food that accompanies the holidays.   Knowledge Questionnaire Score:  Knowledge Questionnaire Score - 07/04/21 1405       Knowledge Questionnaire Score   Pre Score 20/24             Core Components/Risk Factors/Patient Goals at Admission:  Personal Goals and Risk Factors at Admission - 07/04/21 1406       Core Components/Risk Factors/Patient  Goals on Admission    Weight Management Weight Maintenance    Hypertension Yes    Intervention Provide education on lifestyle modifcations including regular physical activity/exercise, weight management, moderate sodium restriction and increased consumption of fresh fruit, vegetables, and low fat dairy, alcohol moderation, and smoking cessation.;Monitor prescription use compliance.    Expected Outcomes Short Term: Continued assessment and intervention until BP is < 140/69m HG in hypertensive participants. < 130/81mHG in hypertensive participants with diabetes, heart failure or chronic kidney disease.;Long Term: Maintenance of blood pressure at goal levels.    Lipids Yes    Intervention Provide education and support for participant on nutrition & aerobic/resistive exercise along with prescribed medications to achieve LDL <7043mHDL >69m44m  Expected Outcomes Short Term:  Participant states understanding of desired cholesterol values and is compliant with medications prescribed. Participant is following exercise prescription and nutrition guidelines.;Long Term: Cholesterol controlled with medications as prescribed, with individualized exercise RX and with personalized nutrition plan. Value goals: LDL < 70mg48mL > 40 mg.    Stress Yes   Husband passed away in 2020 March 04, 2020ing with closure of estate   Intervention Offer individual and/or small group education and counseling on adjustment to heart disease, stress management and health-related lifestyle change. Teach and support self-help strategies.;Refer participants experiencing significant psychosocial distress to appropriate mental health specialists for further evaluation and treatment. When possible, include family members and significant others in education/counseling sessions.    Expected Outcomes Short Term: Participant demonstrates changes in health-related behavior, relaxation and other stress management skills, ability to obtain effective social support, and compliance with psychotropic medications if prescribed.;Long Term: Emotional wellbeing is indicated by absence of clinically significant psychosocial distress or social isolation.             Core Components/Risk Factors/Patient Goals Review:   Goals and Risk Factor Review     Row Name 07/12/21 1052 07/19/21 1030 08/14/21 1657         Core Components/Risk Factors/Patient Goals Review   Personal Goals Review Weight Management/Obesity;Lipids;Stress;Hypertension Weight Management/Obesity;Lipids;Stress;Hypertension Weight Management/Obesity;Lipids;Stress;Hypertension     Review TeresCatarinated exercise at cardiac rehab on 07/12/21 and did well with exercise. Vital signs stable. Oxygen saturation 955 on RA TeresChristellaff to a good start to exercise at phase 2 cardiac. Vital signs and oxygen saturations have been stable. TeresNakyrabeen doing well with exercise  at phase 2 cardiac rehab. Vital signs and oxygen saturations have been stable. TeresMckenzienot been coughing as much on her recent exercise sessions     Expected Outcomes TeresEudora continue to participate in phase 2 cardiac rehab for exercise, nutrition and lifeestyle modifications TeresAlysabeth continue to participate in phase 2 cardiac rehab for exercise, nutrition and lifeestyle modifications TeresKiearra continue to participate in phase 2 cardiac rehab for exercise, nutrition and lifeestyle modifications              Core Components/Risk Factors/Patient Goals at Discharge (Final Review):   Goals and Risk Factor Review - 08/14/21 1657       Core Components/Risk Factors/Patient Goals Review   Personal Goals Review Weight Management/Obesity;Lipids;Stress;Hypertension    Review TeresAtinabeen doing well with exercise at phase 2 cardiac rehab. Vital signs and oxygen saturations have been stable. TeresShyleenot been coughing as much on her recent exercise sessions    Expected Outcomes TeresYanelli continue to participate in phase 2 cardiac rehab for exercise, nutrition and lifeestyle modifications  ITP Comments:  ITP Comments     Row Name 07/04/21 1144 07/12/21 1050 07/19/21 1026 08/14/21 1654     ITP Comments Dr Fransico Him MD, Medical Director 30 day ITP Review. Lasundra started cardiac rehab on 07/12/21 and did well with exercise. 30 day ITP Review. Rosha is off to a good start to exercise at cardiac rehab. 30 day ITP Review. Ciarra has good participation in phase 2 cardiac reyhab. Rye has had some abscences due to doctor's appointments and family obligations             Comments: See ITP Comments

## 2021-08-15 ENCOUNTER — Telehealth: Payer: Self-pay | Admitting: Pharmacist

## 2021-08-15 MED ORDER — ROSUVASTATIN CALCIUM 5 MG PO TABS
5.0000 mg | ORAL_TABLET | ORAL | 3 refills | Status: DC
Start: 2021-08-15 — End: 2021-12-11

## 2021-08-15 NOTE — Telephone Encounter (Signed)
Spoke with pt regarding lipid results. LDL decreased from 180 to 800, currently on rosuvastatin 5mg  taking every other day as well as ezetimibe 10mg  daily. LDL goal is < 70. She is tolerating rosuvastatin well but does not want to increase her dose or frequency due to fear of side effects she's previously experienced with medications. She also declines PCSK9i therapy. She will continue on current meds at this time.

## 2021-08-16 ENCOUNTER — Encounter (HOSPITAL_COMMUNITY): Payer: Medicare Other

## 2021-08-16 ENCOUNTER — Other Ambulatory Visit: Payer: Self-pay

## 2021-08-16 ENCOUNTER — Encounter (HOSPITAL_COMMUNITY)
Admission: RE | Admit: 2021-08-16 | Discharge: 2021-08-16 | Disposition: A | Discharge status: Home / Self Care | Payer: Medicare Other | Source: Ambulatory Visit | Attending: Interventional Cardiology | Admitting: Interventional Cardiology

## 2021-08-16 DIAGNOSIS — Z951 Presence of aortocoronary bypass graft: Secondary | ICD-10-CM

## 2021-08-16 DIAGNOSIS — I214 Non-ST elevation (NSTEMI) myocardial infarction: Secondary | ICD-10-CM | POA: Diagnosis not present

## 2021-08-18 ENCOUNTER — Encounter (HOSPITAL_COMMUNITY)
Admission: RE | Admit: 2021-08-18 | Discharge: 2021-08-18 | Disposition: A | Discharge status: Home / Self Care | Payer: Medicare Other | Source: Ambulatory Visit | Attending: Interventional Cardiology | Admitting: Interventional Cardiology

## 2021-08-18 ENCOUNTER — Encounter (HOSPITAL_COMMUNITY): Payer: Medicare Other

## 2021-08-18 ENCOUNTER — Other Ambulatory Visit: Payer: Self-pay

## 2021-08-18 DIAGNOSIS — I214 Non-ST elevation (NSTEMI) myocardial infarction: Secondary | ICD-10-CM | POA: Diagnosis present

## 2021-08-18 DIAGNOSIS — Z951 Presence of aortocoronary bypass graft: Secondary | ICD-10-CM | POA: Diagnosis not present

## 2021-08-21 ENCOUNTER — Encounter (HOSPITAL_COMMUNITY)
Admission: RE | Admit: 2021-08-21 | Discharge: 2021-08-21 | Disposition: A | Discharge status: Home / Self Care | Payer: Medicare Other | Source: Ambulatory Visit | Attending: Interventional Cardiology | Admitting: Interventional Cardiology

## 2021-08-21 ENCOUNTER — Encounter (HOSPITAL_COMMUNITY): Payer: Medicare Other

## 2021-08-21 ENCOUNTER — Other Ambulatory Visit: Payer: Self-pay

## 2021-08-21 DIAGNOSIS — Z951 Presence of aortocoronary bypass graft: Secondary | ICD-10-CM | POA: Diagnosis not present

## 2021-08-21 DIAGNOSIS — I214 Non-ST elevation (NSTEMI) myocardial infarction: Secondary | ICD-10-CM

## 2021-08-23 ENCOUNTER — Other Ambulatory Visit: Payer: Self-pay

## 2021-08-23 ENCOUNTER — Encounter (HOSPITAL_COMMUNITY)
Admission: RE | Admit: 2021-08-23 | Discharge: 2021-08-23 | Disposition: A | Discharge status: Home / Self Care | Payer: Medicare Other | Source: Ambulatory Visit | Attending: Interventional Cardiology | Admitting: Interventional Cardiology

## 2021-08-23 ENCOUNTER — Encounter (HOSPITAL_COMMUNITY): Payer: Medicare Other

## 2021-08-23 DIAGNOSIS — I214 Non-ST elevation (NSTEMI) myocardial infarction: Secondary | ICD-10-CM

## 2021-08-23 DIAGNOSIS — Z951 Presence of aortocoronary bypass graft: Secondary | ICD-10-CM | POA: Diagnosis not present

## 2021-08-25 ENCOUNTER — Encounter (HOSPITAL_COMMUNITY): Payer: Medicare Other

## 2021-08-28 ENCOUNTER — Encounter (HOSPITAL_COMMUNITY)
Admission: RE | Admit: 2021-08-28 | Discharge: 2021-08-28 | Disposition: A | Discharge status: Home / Self Care | Payer: Medicare Other | Source: Ambulatory Visit | Attending: Interventional Cardiology | Admitting: Interventional Cardiology

## 2021-08-28 ENCOUNTER — Other Ambulatory Visit: Payer: Self-pay

## 2021-08-28 ENCOUNTER — Encounter (HOSPITAL_COMMUNITY): Payer: Medicare Other

## 2021-08-28 DIAGNOSIS — Z951 Presence of aortocoronary bypass graft: Secondary | ICD-10-CM | POA: Diagnosis not present

## 2021-08-28 DIAGNOSIS — I214 Non-ST elevation (NSTEMI) myocardial infarction: Secondary | ICD-10-CM

## 2021-08-30 ENCOUNTER — Encounter (HOSPITAL_COMMUNITY): Payer: Medicare Other

## 2021-08-30 ENCOUNTER — Other Ambulatory Visit: Payer: Self-pay

## 2021-08-30 ENCOUNTER — Encounter (HOSPITAL_COMMUNITY)
Admission: RE | Admit: 2021-08-30 | Discharge: 2021-08-30 | Disposition: A | Discharge status: Home / Self Care | Payer: Medicare Other | Source: Ambulatory Visit | Attending: Interventional Cardiology | Admitting: Interventional Cardiology

## 2021-08-30 VITALS — BP 122/60 | HR 67 | Ht 62.0 in | Wt 129.4 lb

## 2021-08-30 DIAGNOSIS — Z951 Presence of aortocoronary bypass graft: Secondary | ICD-10-CM

## 2021-08-30 DIAGNOSIS — I214 Non-ST elevation (NSTEMI) myocardial infarction: Secondary | ICD-10-CM

## 2021-08-30 NOTE — Progress Notes (Signed)
Discharge Progress Report  Patient Details  Name: Kimberly Calhoun MRN: [id number] Date of Birth: 1940-11-05 Referring Provider:   Flowsheet Row CARDIAC REHAB PHASE II ORIENTATION from 07/04/2021 in [place]  Referring Provider Larae Grooms, MD        Number of Visits: 15  Reason for Discharge:  Patient reached a stable level of exercise. Patient has met program and personal goals.  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  03/22/21 NSTEMI   ADL UCSD:   Initial Exercise Prescription:  Initial Exercise Prescription - 07/04/21 1400       Date of Initial Exercise RX and Referring Provider   Date 07/04/21    Referring Provider Larae Grooms, MD    Expected Discharge Date 07/04/21      NuStep   Level 2    SPM 75    Minutes 30    METs 2      Prescription Details   Frequency (times per week) 3    Duration Progress to 30 minutes of continuous aerobic without signs/symptoms of physical distress      Intensity   THRR 40-80% of Max Heartrate 56-111    Ratings of Perceived Exertion 11-13    Perceived Dyspnea 0-4      Progression   Progression Continue progressive overload as per policy without signs/symptoms or physical distress.      Resistance Training   Training Prescription Yes    Weight 2 lbs    Reps 10-15             Discharge Exercise Prescription (Final Exercise Prescription Changes):  Exercise Prescription Changes - 08/30/21 1029       Response to Exercise   Blood Pressure (Admit) 122/60    Blood Pressure (Exercise) 138/82    Blood Pressure (Exit) 124/62    Heart Rate (Admit) 67 bpm    Heart Rate (Exercise) 96 bpm    Heart Rate (Exit) 70 bpm    Rating of Perceived Exertion (Exercise) 13    Perceived Dyspnea (Exercise) 0    Symptoms None    Comments Patient completed the cardiac rehab program today.     Duration Continue with 30 min of aerobic exercise without signs/symptoms of physical distress.    Intensity THRR unchanged      Progression   Progression Continue to progress workloads to maintain intensity without signs/symptoms of physical distress.    Average METs 2.4      Resistance Training   Training Prescription No      Recumbant Bike   Level 2    Minutes 15    METs 2      NuStep   Level 2    SPM 85    Minutes 15    METs 2.7      Home Exercise Plan   Plans to continue exercise at Home (comment)   Walking   Frequency Add 4 additional days to program exercise sessions.    Initial Home Exercises Provided 08/14/21             Functional Capacity:  6 Minute Walk     Row Name 07/04/21 1409 08/18/21 1037       6 Minute Walk   Phase Initial Discharge    Distance 1200 feet 1448 feet    Distance % Change -- 20.67 %    Distance  Feet Change -- 248 ft    Walk Time 6 minutes 6 minutes    # of Rest Breaks 0 0    MPH 2.3 2.74    METS 2.18 2.56    RPE 11 11    Perceived Dyspnea  0.5 1    VO2 Peak 7.62 8.95    Symptoms Yes (comment) Yes (comment)    Comments mild SOB pt felt was due to mask (RPD = 0.5) Mild shortness of breath.    Resting HR 74 bpm 79 bpm    Resting BP 128/80 138/70    Resting Oxygen Saturation  97 % 97 %    Exercise Oxygen Saturation  during 6 min walk 96 % 97 %    Max Ex. HR 97 bpm 90 bpm    Max Ex. BP 136/82 138/62    2 Minute Post BP 130/80 116/72             Psychological, QOL, Others - Outcomes: PHQ 2/9: Depression screen [facility] 2/9 09/20/2021 07/04/2021  Decreased Interest 0 0  Down, Depressed, Hopeless 0 0  PHQ - 2 Score 0 0    Quality of Life:  Quality of Life - 08/30/21 1432       Quality of Life   Select Quality of Life      Quality of Life Scores   Health/Function Pre 21.14 %    Health/Function Post 25.19 %    Health/Function % Change 19.16 %    Socioeconomic Pre 27.5 %    Socioeconomic Post 27.92 %    Socioeconomic %  Change  1.53 %    Psych/Spiritual Pre 28.43 %    Psych/Spiritual Post 28.93 %    Psych/Spiritual % Change 1.76 %    Family Pre 30 %    Family Post 30 %    Family % Change 0 %    GLOBAL Pre 25.23 %    GLOBAL Post 27.25 %    GLOBAL % Change 8.01 %             Personal Goals: Goals established at orientation with interventions provided to work toward goal.  Personal Goals and Risk Factors at Admission - 07/04/21 1406       Core Components/Risk Factors/Patient Goals on Admission    Weight Management Weight Maintenance    Hypertension Yes    Intervention Provide education on lifestyle modifcations including regular physical activity/exercise, weight management, moderate sodium restriction and increased consumption of fresh fruit, vegetables, and low fat dairy, alcohol moderation, and smoking cessation.;Monitor prescription use compliance.    Expected Outcomes Short Term: Continued assessment and intervention until BP is < 140/56m HG in hypertensive participants. < 130/871mHG in hypertensive participants with diabetes, heart failure or chronic kidney disease.;Long Term: Maintenance of blood pressure at goal levels.    Lipids Yes    Intervention Provide education and support for participant on nutrition & aerobic/resistive exercise along with prescribed medications to achieve LDL <7015mHDL >58m6m  Expected Outcomes Short Term: Participant states understanding of desired cholesterol values and is compliant with medications prescribed. Participant is following exercise prescription and nutrition guidelines.;Long Term: Cholesterol controlled with medications as prescribed, with individualized exercise RX and with personalized nutrition plan. Value goals: LDL < 70mg19mL > 40 mg.    Stress Yes   Husband passed away in 2020 2020/03/10ing with closure of estate   Intervention Offer individual and/or small group education and counseling on adjustment to heart disease, stress management  and  health-related lifestyle change. Teach and support self-help strategies.;Refer participants experiencing significant psychosocial distress to appropriate mental health specialists for further evaluation and treatment. When possible, include family members and significant others in education/counseling sessions.    Expected Outcomes Short Term: Participant demonstrates changes in health-related behavior, relaxation and other stress management skills, ability to obtain effective social support, and compliance with psychotropic medications if prescribed.;Long Term: Emotional wellbeing is indicated by absence of clinically significant psychosocial distress or social isolation.              Personal Goals Discharge:  Goals and Risk Factor Review     Row Name 07/12/21 1052 07/19/21 1030 08/14/21 1657         Core Components/Risk Factors/Patient Goals Review   Personal Goals Review Weight Management/Obesity;Lipids;Stress;Hypertension Weight Management/Obesity;Lipids;Stress;Hypertension Weight Management/Obesity;Lipids;Stress;Hypertension     Review Kasidee started exercise at cardiac rehab on 07/12/21 and did well with exercise. Vital signs stable. Oxygen saturation 955 on RA Marshelle is off to a good start to exercise at phase 2 cardiac. Vital signs and oxygen saturations have been stable. Alois has been doing well with exercise at phase 2 cardiac rehab. Vital signs and oxygen saturations have been stable. Adi has not been coughing as much on her recent exercise sessions     Expected Outcomes Devanee will continue to participate in phase 2 cardiac rehab for exercise, nutrition and lifeestyle modifications Leoni will continue to participate in phase 2 cardiac rehab for exercise, nutrition and lifeestyle modifications Ayriana will continue to participate in phase 2 cardiac rehab for exercise, nutrition and lifeestyle modifications              Exercise Goals and Review:  Exercise Goals     Row  Name 07/04/21 1414             Exercise Goals   Increase Physical Activity Yes       Intervention Provide advice, education, support and counseling about physical activity/exercise needs.;Develop an individualized exercise prescription for aerobic and resistive training based on initial evaluation findings, risk stratification, comorbidities and participant's personal goals.       Expected Outcomes Short Term: Attend rehab on a regular basis to increase amount of physical activity.;Long Term: Add in home exercise to make exercise part of routine and to increase amount of physical activity.;Long Term: Exercising regularly at least 3-5 days a week.       Increase Strength and Stamina Yes       Intervention Provide advice, education, support and counseling about physical activity/exercise needs.;Develop an individualized exercise prescription for aerobic and resistive training based on initial evaluation findings, risk stratification, comorbidities and participant's personal goals.       Expected Outcomes Short Term: Increase workloads from initial exercise prescription for resistance, speed, and METs.;Short Term: Perform resistance training exercises routinely during rehab and add in resistance training at home;Long Term: Improve cardiorespiratory fitness, muscular endurance and strength as measured by increased METs and functional capacity (6MWT)       Able to understand and use rate of perceived exertion (RPE) scale Yes       Intervention Provide education and explanation on how to use RPE scale       Expected Outcomes Short Term: Able to use RPE daily in rehab to express subjective intensity level;Long Term:  Able to use RPE to guide intensity level when exercising independently       Knowledge and understanding of Target Heart Rate Range (THRR) Yes  Intervention Provide education and explanation of THRR including how the numbers were predicted and where they are located for reference        Expected Outcomes Short Term: Able to state/look up THRR;Long Term: Able to use THRR to govern intensity when exercising independently;Short Term: Able to use daily as guideline for intensity in rehab       Understanding of Exercise Prescription Yes       Intervention Provide education, explanation, and written materials on patient's individual exercise prescription       Expected Outcomes Short Term: Able to explain program exercise prescription;Long Term: Able to explain home exercise prescription to exercise independently                Exercise Goals Re-Evaluation:  Exercise Goals Re-Evaluation     Row Name 07/12/21 1011 08/02/21 1100 08/14/21 1100 08/18/21 1150 08/30/21 1132     Exercise Goal Re-Evaluation   Exercise Goals Review Increase Physical Activity;Increase Strength and Stamina;Able to understand and use rate of perceived exertion (RPE) scale;Knowledge and understanding of Target Heart Rate Range (THRR);Understanding of Exercise Prescription Increase Physical Activity;Increase Strength and Stamina;Able to understand and use rate of perceived exertion (RPE) scale;Knowledge and understanding of Target Heart Rate Range (THRR);Understanding of Exercise Prescription Increase Physical Activity;Increase Strength and Stamina;Able to understand and use rate of perceived exertion (RPE) scale;Knowledge and understanding of Target Heart Rate Range (THRR);Understanding of Exercise Prescription Increase Physical Activity;Increase Strength and Stamina;Able to understand and use rate of perceived exertion (RPE) scale;Knowledge and understanding of Target Heart Rate Range (THRR);Understanding of Exercise Prescription Increase Physical Activity;Increase Strength and Stamina;Able to understand and use rate of perceived exertion (RPE) scale;Knowledge and understanding of Target Heart Rate Range (THRR);Understanding of Exercise Prescription   Comments Pt first day in the CRP2 program. Pt tolerated exercise  well with an average MET level of 1.7. Pt is learing her THRR, RPE and exercise Rx. Pt is off to a good start Patient is interested in trying different equipment. Will look at adding upright or recumbent bike to patient's exercise routine. Reviewed exercise prescription with patient, and patient is currently walking 10 minutes daily as her mode of exercise. Discussed increasing duration and patient is amenable to this. Patient has a smart watch to check her pulse. Patient's goal is to decrease the amount of medications she's taking and to be able to lift more than a jug of milk. I advised patient to contact her cardiologist's office regarding lifting restrictions. Will increase weights from 2 lbs to 3 lbs at next session. Added recumbent bike to patient's exercise routine last week, and she tolerated it well. Patient's functional capacity increased 21% as measured by 6MWT, strength increased 39% as measured by grip strength test, and flexibility increased 6% as measured by sit-and-reach test. Patient completed the cardiac rehab program today and made gradual progress with exercise. Patient plans to continue exercise 30 minutes at least 3 days/week at the gym near her residence.   Expected Outcomes Will continue to monitor pt and progress workloads as tolerated without sign or symptom Will add bike to exercise routine at cardiac rehab and progress workloads as tolerated. Patient will increase hand weights from 2 to 3 lbs to help build strength. Continue to progress workloads as tolerated. Patient will continue exercise at least 30 minutes, 3-7 days/week at local gym and walking to maintain health and fitness gains.            Nutrition & Weight - Outcomes:  Pre Biometrics -  07/04/21 1000       Pre Biometrics   Waist Circumference 36.5 inches    Hip Circumference 38.5 inches    Waist to Hip Ratio 0.95 %    Triceps Skinfold 22 mm    % Body Fat 37.5 %    Grip Strength 24 kg    Flexibility 12 in     Single Leg Stand 5.25 seconds             Post Biometrics - 08/30/21 1027        Post  Biometrics   Height 5' 2"  (1.575 m)    Waist Circumference 32.75 inches    Hip Circumference 36.75 inches    Waist to Hip Ratio 0.89 %    BMI (Calculated) 23.66    Triceps Skinfold 24 mm    % Body Fat 36.6 %    Grip Strength 33.5 kg    Flexibility 12.75 in    Single Leg Stand 4.18 seconds             Nutrition:   Nutrition Discharge:   Education Questionnaire Score:  Knowledge Questionnaire Score - 08/30/21 1432       Knowledge Questionnaire Score   Pre Score 20/24    Post Score 23/24             Goals reviewed with patient; copy given to patient.Pt graduated from cardiac rehab program on 08/30/21 with completion of 15 exercise sessions in Phase II. Pt maintained good attendance and progressed nicely during his participation in rehab as evidenced by increased MET level.   Medication list reconciled. Repeat  PHQ score-0  .  Pt has made significant lifestyle changes and should be commended for her success. Pt feels she has achieved his goals during cardiac rehab.   Pt plans to continue exercise by exercising at the gym and walking. Iolani increased her distance on her post exercise walk test by 248 feet.We are proud of Kinzlee's progress!Barnet Pall, RN,BSN 09/20/2021 10:09 AM

## 2021-09-01 ENCOUNTER — Encounter (HOSPITAL_COMMUNITY): Payer: Medicare Other

## 2021-09-11 ENCOUNTER — Other Ambulatory Visit: Payer: Self-pay | Admitting: Interventional Cardiology

## 2021-09-12 MED ORDER — APIXABAN 2.5 MG PO TABS: 2.5000 mg | ORAL_TABLET | Freq: Two times a day (BID) | ORAL | 5 refills | Status: DC

## 2021-09-12 NOTE — Telephone Encounter (Signed)
Called spoke with pt, advised of need to lower dosage of Eliquis from 5mg  BID to 2.5mg  BID. Pt verbalized understanding and thanked me for the call.  Will send in new rx for 2.5mg  tablets of Eliquis to pt's pharmacy.

## 2021-09-12 NOTE — Telephone Encounter (Signed)
Pt last saw Tereso Newcomer, PA on 06/20/21, last labs 07/05/21 Creat 1.14, age 80, weight 58.7kg, based on age >42 and weight <60kg, pt is not on appropriate dosage of Eliquis.  Forwarded message to Dr Eldridge Dace to see if dosage change is appropriate. Will await response.

## 2021-09-12 NOTE — Telephone Encounter (Signed)
-----   Message from Corky Crafts, MD sent at 09/12/2021  2:54 PM EST ----- OK to change to Eliquis 2.5 mg BID  JV ----- Message ----- From: Satira Sark, RN Sent: 09/12/2021   7:58 AM EST To: Corky Crafts, MD  Pt last saw Tereso Newcomer, PA on 06/20/21, last labs 07/05/21 Creat 1.14, age 80, weight 58.7kg, based on age >26 and weight <60kg, pt is not on appropriate dosage of Eliquis.  Will forward message to Dr Eldridge Dace to see if dosage change is appropriate. Please advise if dosage change to Eliquis 2.5mg  BID is appropriate. Thanks.

## 2021-09-21 ENCOUNTER — Other Ambulatory Visit: Payer: Self-pay | Admitting: Family Medicine

## 2021-09-21 ENCOUNTER — Ambulatory Visit
Admission: RE | Admit: 2021-09-21 | Discharge: 2021-09-21 | Disposition: A | Discharge status: Home / Self Care | Payer: Medicare Other | Source: Ambulatory Visit | Attending: Family Medicine | Admitting: Family Medicine

## 2021-09-21 DIAGNOSIS — J90 Pleural effusion, not elsewhere classified: Secondary | ICD-10-CM

## 2021-10-03 ENCOUNTER — Other Ambulatory Visit: Payer: Self-pay | Admitting: Physician Assistant

## 2021-10-03 DIAGNOSIS — D75839 Thrombocytosis, unspecified: Secondary | ICD-10-CM

## 2021-10-03 DIAGNOSIS — D508 Other iron deficiency anemias: Secondary | ICD-10-CM

## 2021-10-04 ENCOUNTER — Inpatient Hospital Stay: Payer: Medicare Other | Admitting: Physician Assistant

## 2021-10-04 ENCOUNTER — Other Ambulatory Visit: Payer: Self-pay

## 2021-10-04 ENCOUNTER — Inpatient Hospital Stay: Payer: Medicare Other | Attending: Physician Assistant

## 2021-10-04 DIAGNOSIS — D508 Other iron deficiency anemias: Secondary | ICD-10-CM

## 2021-10-04 DIAGNOSIS — D75839 Thrombocytosis, unspecified: Secondary | ICD-10-CM | POA: Insufficient documentation

## 2021-10-04 DIAGNOSIS — D509 Iron deficiency anemia, unspecified: Secondary | ICD-10-CM | POA: Insufficient documentation

## 2021-10-04 LAB — SEDIMENTATION RATE: Sed Rate: 48 mm/hr — ABNORMAL HIGH (ref 0–22)

## 2021-10-04 LAB — CBC WITH DIFFERENTIAL (CANCER CENTER ONLY)
Abs Immature Granulocytes: 0.02 10*3/uL (ref 0.00–0.07)
Basophils Absolute: 0.1 10*3/uL (ref 0.0–0.1)
Basophils Relative: 1 %
Eosinophils Absolute: 0.2 10*3/uL (ref 0.0–0.5)
Eosinophils Relative: 3 %
HCT: 35.4 % — ABNORMAL LOW (ref 36.0–46.0)
Hemoglobin: 11.5 g/dL — ABNORMAL LOW (ref 12.0–15.0)
Immature Granulocytes: 0 %
Lymphocytes Relative: 20 %
Lymphs Abs: 1.8 10*3/uL (ref 0.7–4.0)
MCH: 28.4 pg (ref 26.0–34.0)
MCHC: 32.5 g/dL (ref 30.0–36.0)
MCV: 87.4 fL (ref 80.0–100.0)
Monocytes Absolute: 0.8 10*3/uL (ref 0.1–1.0)
Monocytes Relative: 10 %
Neutro Abs: 5.7 10*3/uL (ref 1.7–7.7)
Neutrophils Relative %: 66 %
Platelet Count: 416 10*3/uL — ABNORMAL HIGH (ref 150–400)
RBC: 4.05 MIL/uL (ref 3.87–5.11)
RDW: 15.9 % — ABNORMAL HIGH (ref 11.5–15.5)
WBC Count: 8.6 10*3/uL (ref 4.0–10.5)
nRBC: 0 % (ref 0.0–0.2)

## 2021-10-04 LAB — IRON AND IRON BINDING CAPACITY (CC-WL,HP ONLY)
Iron: 52 ug/dL (ref 28–170)
Saturation Ratios: 16 % (ref 10.4–31.8)
TIBC: 333 ug/dL (ref 250–450)
UIBC: 281 ug/dL

## 2021-10-05 ENCOUNTER — Telehealth: Payer: Self-pay

## 2021-10-05 LAB — FERRITIN: Ferritin: 110 ng/mL (ref 11–307)

## 2021-10-05 NOTE — Telephone Encounter (Signed)
Pt LM stating she did not know she had an appt after her lab work.  LM for pt to call and reschedule her 10/04/21 appt

## 2021-10-05 NOTE — Telephone Encounter (Signed)
Pt left a second message stating she had her lab work but she does not have cancer and does not know why she needs to be seen.  Called pt and explained the need for the follow up lab and appt.  Pt verbalized understanding.   Message will be sent to scheduling to reschedule 1/18 appt

## 2021-10-06 ENCOUNTER — Ambulatory Visit
Admission: RE | Admit: 2021-10-06 | Discharge: 2021-10-06 | Disposition: A | Discharge status: Home / Self Care | Payer: Medicare Other | Source: Ambulatory Visit | Attending: Family Medicine | Admitting: Family Medicine

## 2021-10-06 DIAGNOSIS — Z1231 Encounter for screening mammogram for malignant neoplasm of breast: Secondary | ICD-10-CM

## 2021-10-10 ENCOUNTER — Inpatient Hospital Stay: Payer: Medicare Other | Admitting: Physician Assistant

## 2021-10-10 ENCOUNTER — Other Ambulatory Visit: Payer: Self-pay

## 2021-10-10 VITALS — BP 134/62 | HR 76 | Temp 98.1°F | Resp 15 | Wt 133.6 lb

## 2021-10-10 DIAGNOSIS — D75839 Thrombocytosis, unspecified: Secondary | ICD-10-CM

## 2021-10-10 DIAGNOSIS — D508 Other iron deficiency anemias: Secondary | ICD-10-CM | POA: Diagnosis not present

## 2021-10-11 ENCOUNTER — Telehealth: Payer: Self-pay | Admitting: Hematology and Oncology

## 2021-10-11 NOTE — Telephone Encounter (Signed)
Scheduled per 1/24 los, pt has been called and confirmed

## 2021-10-12 MED ORDER — FERROUS SULFATE 325 (65 FE) MG PO TBEC: 325.0000 mg | DELAYED_RELEASE_TABLET | Freq: Every day | ORAL | 3 refills | Status: DC

## 2021-10-12 NOTE — Progress Notes (Signed)
[facility] [phone]   [phone]  PROGRESS NOTE  Patient Care Team: Vernie Shanks, MD as PCP - General (Family Medicine) Jettie Booze, MD as PCP - Cardiology (Cardiology)  Hematological/Oncological History 1) Labs from PCP, Dr. Truett Perna: -05/17/2021: WBC 9.8, Hgb 11.7 (L), MCV 87.7, Plt 564 (H)  2) Labs from ED visit: -06/15/2021: WBC 12.5 (H), Hgb 10.9 (L), MCV 87.9, Plt 577 (H)  3) 07/05/2021: Establish care with [facility] Hematology/Oncology.  -Found to have iron deficiency, recommended to start ferrous sulfate 325 mg once daily  CHIEF COMPLAINTS/PURPOSE OF CONSULTATION:  "Thrombocytosis"  HISTORY OF PRESENTING ILLNESS:  Kimberly Calhoun 81 y.o. female returns for a follow up for thrombocytosis and iron deficiency anemia.   On exam today, Ms. Karlovich reports that her energy levels are unchanged. She tries to be active and complete her daily ADLs independently. She denies nausea, vomiting or abdominal pain. Her bowel movements are unchanged without diarrhea or constipation. She reports occasional episodes of nose bleeds and gum bleeding that she suspects is coming from the anticoagulation. She denies fevers, chills, night sweats, shortness of breath,chest pain, cough, headaches or dizziness. She has no other complaints. Rest of the 10 point ROS is below.   MEDICAL HISTORY:  Past Medical History:  Diagnosis Date   Arthritis    Asthma    CAD (coronary artery disease)    s/p CABG   Chronic kidney disease, stage 3 (HCC)    Dyslipidemia 05/13/2021   Headache    Hypercholesteremia    Hyperlipidemia    Osteopenia    Personal history of colonic polyps    Pleural effusion    recurrent    SURGICAL HISTORY: Past Surgical History:  Procedure Laterality Date   ABDOMINAL HYSTERECTOMY  02/15/2013   prolapse   CARDIAC CATHETERIZATION     COLONOSCOPY WITH PROPOFOL N/A 12/07/2014   Procedure: COLONOSCOPY WITH PROPOFOL;  Surgeon: Ronald Lobo, MD;  Location: [facility];  Service: Endoscopy;  Laterality: N/A;  ultraslim   CORONARY ARTERY BYPASS GRAFT N/A 03/24/2021   Procedure: CORONARY ARTERY BYPASS GRAFTING (CABG) TIMES THREE USING LEFT INTERNAL MAMMARY ARTERY AND RIGHT GREATER SAPHENOUS VEIN HARVESTED ENDOSCOPICALLY;  Surgeon: Wonda Olds, MD;  Location: [facility];  Service: Open Heart Surgery;  Laterality: N/A;   IR THORACENTESIS ASP PLEURAL SPACE W/IMG GUIDE  04/17/2021   LEFT HEART CATH AND CORONARY ANGIOGRAPHY N/A 03/22/2021   Procedure: LEFT HEART CATH AND CORONARY ANGIOGRAPHY;  Surgeon: Troy Sine, MD;  Location: [facility] CV [facility];  Service: Cardiovascular;  Laterality: N/A;   TEE WITHOUT CARDIOVERSION N/A 03/24/2021   Procedure: TRANSESOPHAGEAL ECHOCARDIOGRAM (TEE);  Surgeon: Wonda Olds, MD;  Location: [facility];  Service: Open Heart Surgery;  Laterality: N/A;    SOCIAL HISTORY: Social History   Socioeconomic History   Marital status: Widowed    Spouse name: Not on file   Number of children: 1   Years of education: 58   Highest education level: Not on file  Occupational History   Occupation: retired  Tobacco Use   Smoking status: Never   Smokeless tobacco: Never  Vaping Use   Vaping Use: Never used  Substance and Sexual Activity   Alcohol use: No   Drug use: No   Sexual activity: Not Currently  Other Topics Concern   Not on file  Social History Narrative   Not on file   Social Determinants of Health   Financial Resource Strain: Not on file  Food Insecurity: Not on file  Transportation Needs: Not on file  Physical Activity: Not on file  Stress: Not on file  Social Connections: Not on file  Intimate Partner Violence: Not on file    FAMILY HISTORY: Family History  Problem Relation Age of Onset   Cancer - Colon Mother    Heart disease Father    Atrial fibrillation Sister    Rheum arthritis Sister    Diabetes Brother    Breast cancer Neg Hx     ALLERGIES:  is allergic to  amiodarone, macrobid [nitrofurantoin], mold extract [trichophyton], statins, zyrtec [cetirizine hcl], and other.  MEDICATIONS:  Current Outpatient Medications  Medication Sig Dispense Refill   acetaminophen (TYLENOL) 500 MG tablet Take 500-1,000 mg by mouth every 6 (six) hours as needed for moderate pain or headache.     albuterol (VENTOLIN HFA) 108 (90 Base) MCG/ACT inhaler Inhale 1 puff into the lungs every 6 (six) hours as needed for shortness of breath.     apixaban (ELIQUIS) 2.5 MG TABS tablet Take 1 tablet (2.5 mg total) by mouth 2 (two) times daily. 60 tablet 5   aspirin 81 MG EC tablet Take 81 mg by mouth daily. Swallow whole.     CALCIUM PO Take 1 tablet by mouth daily.     Cholecalciferol (VITAMIN D PO) Take 1 tablet by mouth every morning.     ezetimibe (ZETIA) 10 MG tablet Take 1 tablet (10 mg total) by mouth daily. 30 tablet 11   fluticasone (FLONASE) 50 MCG/ACT nasal spray Place 1 spray into both nostrils daily as needed for allergies.     metoprolol tartrate (LOPRESSOR) 25 MG tablet Take 3 tablets (75 mg total) by mouth 2 (two) times daily. 540 tablet 3   Multiple Vitamin (MULTIVITAMIN WITH MINERALS) TABS tablet Take 1 tablet by mouth at bedtime. Centrum silver.     Propylene Glycol (SYSTANE BALANCE OP) Apply 1 drop to eye every 4 (four) hours as needed (dry eyes.).     rosuvastatin (CRESTOR) 5 MG tablet Take 1 tablet (5 mg total) by mouth every other day. 45 tablet 3   vitamin C (ASCORBIC ACID) 500 MG tablet Take 500 mg by mouth every morning.     vitamin E 180 MG (400 UNITS) capsule Take 1 capsule (400 Units total) by mouth every morning.     zinc gluconate 50 MG tablet Take 50 mg by mouth daily.     ferrous sulfate 325 (65 FE) MG EC tablet Take 1 tablet (325 mg total) by mouth daily with breakfast. 30 tablet 3   No current facility-administered medications for this visit.    REVIEW OF SYSTEMS:   Constitutional: ( - ) fevers, ( - )  chills , ( - ) night sweats Eyes: ( - )  blurriness of vision, ( - ) double vision, ( - ) watery eyes Ears, nose, mouth, throat, and face: ( - ) mucositis, ( - ) sore throat Respiratory: ( - ) cough, ( - ) dyspnea, ( - ) wheezes Cardiovascular: ( - ) palpitation, ( - ) chest discomfort, ( - ) lower extremity swelling Gastrointestinal:  ( - ) nausea, ( - ) heartburn, ( - ) change in bowel habits Skin: ( - ) abnormal skin rashes Lymphatics: ( - ) new lymphadenopathy, ( - ) easy bruising Neurological: ( - ) numbness, ( - ) tingling, ( - ) new weaknesses Behavioral/Psych: ( - ) mood change, ( - ) new changes  All other systems  were reviewed with the patient and are negative.  PHYSICAL EXAMINATION: ECOG PERFORMANCE STATUS: 0 - Asymptomatic  Vitals:   10/10/21 1447  BP: 134/62  Pulse: 76  Resp: 15  Temp: 98.1 F (36.7 C)  SpO2: 97%   Filed Weights   10/10/21 1447  Weight: 133 lb 9.6 oz (60.6 kg)    GENERAL: well appearing female in NAD  SKIN: skin color, texture, turgor are normal, no rashes or significant lesions EYES: conjunctiva are pink and non-injected, sclera clear OROPHARYNX: no exudate, no erythema; lips, buccal mucosa, and tongue normal  LUNGS: clear to auscultation and percussion with normal breathing effort HEART: regular rate & rhythm and no murmurs and no lower extremity edema ABDOMEN: soft, non-tender, non-distended, normal bowel sounds Musculoskeletal: no cyanosis of digits and no clubbing  PSYCH: alert & oriented x 3, fluent speech NEURO: no focal motor/sensory deficits  LABORATORY DATA:  I have reviewed the data as listed CBC Latest Ref Rng & Units 10/04/2021 07/05/2021 06/15/2021  WBC 4.0 - 10.5 K/uL 8.6 9.4 12.5(H)  Hemoglobin 12.0 - 15.0 g/dL 11.5(L) 11.8(L) 10.9(L)  Hematocrit 36.0 - 46.0 % 35.4(L) 36.7 33.3(L)  Platelets 150 - 400 K/uL 416(H) 453(H) 577(H)    CMP Latest Ref Rng & Units 08/14/2021 07/05/2021 06/15/2021  Glucose 70 - 99 mg/dL - 94 98  BUN 8 - 23 mg/dL - 20 24(H)  Creatinine 0.44  - 1.00 mg/dL - 1.14(H) 0.88  Sodium 135 - 145 mmol/L - 138 134(L)  Potassium 3.5 - 5.1 mmol/L - 4.4 3.9  Chloride 98 - 111 mmol/L - 104 101  CO2 22 - 32 mmol/L - 24 24  Calcium 8.9 - 10.3 mg/dL - 10.0 9.2  Total Protein 6.0 - 8.5 g/dL 6.6 7.8 -  Total Bilirubin 0.0 - 1.2 mg/dL 0.2 0.2(L) -  Alkaline Phos 44 - 121 IU/L 181(H) 144(H) -  AST 0 - 40 IU/L 25 19 -  ALT 0 - 32 IU/L 30 23 -   RADIOGRAPHIC STUDIES: I have personally reviewed the radiological images as listed and agreed with the findings in the report. DG Chest 2 View  Result Date: 09/21/2021 CLINICAL DATA:  Follow-up left effusion EXAM: CHEST - 2 VIEW COMPARISON:  08/02/2021 FINDINGS: Previous median sternotomy. Heart size is normal. The right chest is clear. Chronic pleural blunting on the left could indicate pleural scarring or a small amount of pleural fluid. This is slightly less prominent than was seen in November. There is mild volume loss at the left base in association with this. The remainder of the left chest is clear. Ordinary degenerative changes affect the spine. IMPRESSION: Chronic pleural blunting on the left, slightly less prominent than was seen in November of 2022. This could be due to pleural scarring and or a small amount of pleural fluid on the left. Mild left base atelectasis associated with that Electronically Signed   By: Nelson Chimes M.D.   On: 09/21/2021 15:59   MM 3D SCREEN BREAST BILATERAL  Result Date: 10/06/2021 CLINICAL DATA:  Screening. EXAM: DIGITAL SCREENING BILATERAL MAMMOGRAM WITH TOMOSYNTHESIS AND CAD TECHNIQUE: Bilateral screening digital craniocaudal and mediolateral oblique mammograms were obtained. Bilateral screening digital breast tomosynthesis was performed. The images were evaluated with computer-aided detection. COMPARISON:  Previous exam(s). ACR Breast Density Category b: There are scattered areas of fibroglandular density. FINDINGS: There are no findings suspicious for malignancy. IMPRESSION:  No mammographic evidence of malignancy. A result letter of this screening mammogram will be mailed directly to the patient.  RECOMMENDATION: Screening mammogram in one year. (Code:SM-B-01Y) BI-RADS CATEGORY  1: Negative. Electronically Signed   By: Lajean Manes M.D.   On: 10/06/2021 14:00    ASSESSMENT & PLAN Kimberly Calhoun is a 81 y.o. female who returns for a follow up for thrombocytosis and iron deficiency anemia.   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 Sed rate is still elevated at 48 mm/hr. Iron levels have improved.  --Patient reports that she is no longer taking ferrous sulfate 325 mg once daily as prescribed as her prescription expired. Recommend to continue to take once daily with a source of vitamin C. Sent refill today.  --RTC in 6 months with labs.   Orders Placed This Encounter  Procedures   CBC with Differential ([facility] Only)    Standing Status:   Future    Standing Expiration Date:   10/12/2022   Ferritin    Standing Status:   Future    Standing Expiration Date:   10/12/2022   Iron and Iron Binding Capacity ([facility] only)    Standing Status:   Future    Standing Expiration Date:   10/12/2022   Sedimentation rate    Standing Status:   Future    Standing Expiration Date:   10/12/2022    All questions were answered. The patient knows to call the clinic with any problems, questions or concerns.  I have spent a total of 25 minutes minutes of face-to-face and non-face-to-face time, preparing to see the patient, performing a medically appropriate examination, counseling and educating the patient, ordering medications/tests, documenting clinical information in the electronic health record, and care coordination.   Dede Query, PA-C Department of  Hematology/Oncology [facility] Phone: [phone]

## 2021-12-10 NOTE — Progress Notes (Signed)
?  ?Cardiology Office Note ? ? ?Date:  12/11/2021  ? ?ID:  MARGERET Calhoun, DOB 18-Jun-1941, MRN [id number] ? ?PCP:  Ileana Ladd, MD  ? ? ?Chief Complaint  ?Patient presents with  ? Follow-up  ? ?CAD ? ?Wt Readings from Last 3 Encounters:  ?12/11/21 135 lb (61.2 kg)  ?10/10/21 133 lb 9.6 oz (60.6 kg)  ?08/30/21 129 lb 6.6 oz (58.7 kg)  ?  ? ?  ?History of Present Illness: ?Kimberly Calhoun is a 81 y.o. female   with CAD. ?  ?Her husband was my patient, Kimberly Calhoun, and he passed away in September 17, 2019.  ?  ?In March 2022, she was having severe allergies.  She felt some chest wall muscle spasms which improved with treatment from a chiropractor.   ?  ?She also had some DOE.  Worse when she had a new cat.  better since she got rid of the cat.  She thought it was allergy related.   ?  ?She had unstable angina in July 2022 which led to cath showing multivessel disease.  ?"Coronary Artery Bypass Grafting x 3 ?            Left Internal Mammary Artery to Distal Left Anterior Descending Coronary Artery, Saphenous Vein Graft to Posterior Descending Coronary Artery, Saphenous Vein Graft to  Diagonal Branch Coronary Artery, Endoscopic Vein Harvest from right thigh and Lower Leg" ?  ?She had post op AFib and pleural effusion, thoracentesis x 2.   ?  ? Echo in 04/2021 showed: ?"Echo results as noted: ? 1. Left ventricular ejection fraction, by estimation, is 65 to 70%. The  ?left ventricle has normal function. The left ventricle has no regional  ?wall motion abnormalities. Left ventricular diastolic parameters are  ?consistent with Grade I diastolic  ?dysfunction (impaired relaxation). Elevated left ventricular end-diastolic  ?pressure.  ? 2. Right ventricular systolic function is mildly reduced. The right  ?ventricular size is normal. There is normal pulmonary artery systolic  ?pressure. The estimated right ventricular systolic pressure is 27.4 mmHg.  ? 3. Large pleural effusion in the left lateral region.  ? 4. The mitral valve is  normal in structure. No evidence of mitral valve  ?regurgitation. No evidence of mitral stenosis.  ? 5. The aortic valve is tricuspid. Aortic valve regurgitation is not  ?visualized. Mild aortic valve sclerosis is present, with no evidence of  ?aortic valve stenosis.  ? 6. The inferior vena cava is normal in size with greater than 50%  ?respiratory variability, suggesting right atrial pressure of 3 mmHg. " ?  ?She has more fluid per pulmonary.  Chest x-ray was done 2 days ago.  She is waiting to hear whether or not she will need repeat thoracentesis.  She does report some dyspnea on exertion.  It resolves quickly after resting for few minutes. ? ? ? ?Past Medical History:  ?Diagnosis Date  ? Arthritis   ? Asthma   ? CAD (coronary artery disease)   ? s/p CABG  ? Chronic kidney disease, stage 3 (HCC)   ? Dyslipidemia 05/13/2021  ? Headache   ? Hypercholesteremia   ? Hyperlipidemia   ? Osteopenia   ? Personal history of colonic polyps   ? Pleural effusion   ? recurrent  ? ? ?Past Surgical History:  ?Procedure Laterality Date  ? ABDOMINAL HYSTERECTOMY  02/15/2013  ? prolapse  ? CARDIAC CATHETERIZATION    ? COLONOSCOPY WITH PROPOFOL N/A 12/07/2014  ? Procedure: COLONOSCOPY WITH PROPOFOL;  Surgeon: Bernette Redbird, MD;  Location: [facility];  Service: Endoscopy;  Laterality: N/A;  ultraslim  ? CORONARY ARTERY BYPASS GRAFT N/A 03/24/2021  ? Procedure: CORONARY ARTERY BYPASS GRAFTING (CABG) TIMES THREE USING LEFT INTERNAL MAMMARY ARTERY AND RIGHT GREATER SAPHENOUS VEIN HARVESTED ENDOSCOPICALLY;  Surgeon: Linden Dolin, MD;  Location: [facility];  Service: Open Heart Surgery;  Laterality: N/A;  ? IR THORACENTESIS ASP PLEURAL SPACE W/IMG GUIDE  04/17/2021  ? LEFT HEART CATH AND CORONARY ANGIOGRAPHY N/A 03/22/2021  ? Procedure: LEFT HEART CATH AND CORONARY ANGIOGRAPHY;  Surgeon: Lennette Bihari, MD;  Location: [facility] INVASIVE CV LAB;  Service: Cardiovascular;  Laterality: N/A;  ? TEE WITHOUT CARDIOVERSION N/A 03/24/2021  ?  Procedure: TRANSESOPHAGEAL ECHOCARDIOGRAM (TEE);  Surgeon: Linden Dolin, MD;  Location: [facility] OR;  Service: Open Heart Surgery;  Laterality: N/A;  ? ? ? ?Current Outpatient Medications  ?Medication Sig Dispense Refill  ? acetaminophen (TYLENOL) 500 MG tablet Take 500-1,000 mg by mouth every 6 (six) hours as needed for moderate pain or headache.    ? albuterol (VENTOLIN HFA) 108 (90 Base) MCG/ACT inhaler Inhale 1 puff into the lungs every 6 (six) hours as needed for shortness of breath.    ? apixaban (ELIQUIS) 2.5 MG TABS tablet Take 1 tablet (2.5 mg total) by mouth 2 (two) times daily. 60 tablet 5  ? aspirin 81 MG EC tablet Take 81 mg by mouth daily. Swallow whole.    ? CALCIUM PO Take 1 tablet by mouth daily.    ? Cholecalciferol (VITAMIN D PO) Take 1 tablet by mouth every morning.    ? ezetimibe (ZETIA) 10 MG tablet Take 1 tablet (10 mg total) by mouth daily. 30 tablet 11  ? ferrous sulfate 325 (65 FE) MG EC tablet Take 1 tablet (325 mg total) by mouth daily with breakfast. 30 tablet 3  ? fluticasone (FLONASE) 50 MCG/ACT nasal spray Place 1 spray into both nostrils daily as needed for allergies.    ? metoprolol tartrate (LOPRESSOR) 25 MG tablet Take 3 tablets (75 mg total) by mouth 2 (two) times daily. 540 tablet 3  ? Multiple Vitamin (MULTIVITAMIN WITH MINERALS) TABS tablet Take 1 tablet by mouth at bedtime. Centrum silver.    ? Propylene Glycol (SYSTANE BALANCE OP) Apply 1 drop to eye every 4 (four) hours as needed (dry eyes.).    ? vitamin C (ASCORBIC ACID) 500 MG tablet Take 500 mg by mouth every morning.    ? vitamin E 180 MG (400 UNITS) capsule Take 1 capsule (400 Units total) by mouth every morning.    ? zinc gluconate 50 MG tablet Take 50 mg by mouth daily.    ? ?No current facility-administered medications for this visit.  ? ? ?Allergies:   Amiodarone, Macrobid [nitrofurantoin], Mold extract [trichophyton], Other, Statins, and Zyrtec [cetirizine hcl]  ? ? ?Social History:  The patient  reports that she  has never smoked. She has never used smokeless tobacco. She reports that she does not drink alcohol and does not use drugs.  ? ?Family History:  The patient's family history includes Atrial fibrillation in her sister; Cancer - Colon in her mother; Diabetes in her brother; Heart disease in her father; Rheum arthritis in her sister.  ? ? ?ROS:  Please see the history of present illness.   Otherwise, review of systems are positive for unsteadiness.   All other systems are reviewed and negative.  ? ? ?PHYSICAL EXAM: ?VS:  BP 140/70   Pulse 66  Ht 5\' 2"  (1.575 m)   Wt 135 lb (61.2 kg)   SpO2 97%   BMI 24.69 kg/m?  , BMI Body mass index is 24.69 kg/m?. ?GEN: Well nourished, well developed, in no acute distress ?HEENT: normal ?Neck: no JVD, carotid bruits, or masses ?Cardiac: RRR; no murmurs, rubs, or gallops,no edema  ?Respiratory:  clear to auscultation bilaterally, normal work of breathing; able to hear air movement at both bases ?GI: soft, nontender, nondistended, + BS ?MS: no deformity or atrophy ?Skin: warm and dry, no rash ?Neuro:  Strength and sensation are intact; mild unsteadiness ?Psych: euthymic mood, full affect ? ? ?EKG:   ?The ekg ordered today demonstrates NSR; changed from AFib in 08/2021 ? ? ?Recent Labs: ?05/13/2021: B Natriuretic Peptide 377.0; Magnesium 2.2; TSH 4.202 ?07/05/2021: BUN 20; Creatinine 1.14; Potassium 4.4; Sodium 138 ?08/14/2021: ALT 30 ?10/04/2021: Hemoglobin 11.5; Platelet Count 416  ? ?Lipid Panel ?   ?Component Value Date/Time  ? CHOL 209 (H) 08/14/2021 0845  ? TRIG 128 08/14/2021 0845  ? HDL 51 08/14/2021 0845  ? CHOLHDL 4.1 08/14/2021 0845  ? CHOLHDL 4.4 03/22/2021 0149  ? VLDL 28 03/22/2021 0149  ? LDLCALC 135 (H) 08/14/2021 0845  ? ?  ?Other studies Reviewed: ?Additional studies/ records that were reviewed today with results demonstrating: labs and other records reviewed. ? ? ?ASSESSMENT AND PLAN: ? ?CAD: No angina on medical therapy.  Continue aggressive secondary  prevention. ?AFib: No palpitations. Appears to be in NSR by exam.   ECG confirms that she is in sinus.  She is concerned that the dizziness is in part for the metoprolol.  We will decrease a.m. dose of metoprolol to 50 mg.  Con

## 2021-12-11 ENCOUNTER — Other Ambulatory Visit: Payer: Self-pay

## 2021-12-11 ENCOUNTER — Ambulatory Visit: Payer: Medicare Other | Admitting: Interventional Cardiology

## 2021-12-11 ENCOUNTER — Encounter: Payer: Self-pay | Admitting: Interventional Cardiology

## 2021-12-11 VITALS — BP 140/70 | HR 66 | Ht 62.0 in | Wt 135.0 lb

## 2021-12-11 DIAGNOSIS — E782 Mixed hyperlipidemia: Secondary | ICD-10-CM | POA: Diagnosis not present

## 2021-12-11 DIAGNOSIS — I25700 Atherosclerosis of coronary artery bypass graft(s), unspecified, with unstable angina pectoris: Secondary | ICD-10-CM

## 2021-12-11 DIAGNOSIS — I421 Obstructive hypertrophic cardiomyopathy: Secondary | ICD-10-CM | POA: Diagnosis not present

## 2021-12-11 DIAGNOSIS — I4819 Other persistent atrial fibrillation: Secondary | ICD-10-CM

## 2021-12-11 DIAGNOSIS — M791 Myalgia, unspecified site: Secondary | ICD-10-CM

## 2021-12-11 DIAGNOSIS — T466X5A Adverse effect of antihyperlipidemic and antiarteriosclerotic drugs, initial encounter: Secondary | ICD-10-CM

## 2021-12-11 DIAGNOSIS — I10 Essential (primary) hypertension: Secondary | ICD-10-CM

## 2021-12-11 MED ORDER — METOPROLOL TARTRATE 25 MG PO TABS: ORAL_TABLET | ORAL | 3 refills | Status: DC

## 2021-12-11 NOTE — Patient Instructions (Signed)
Medication Instructions:  ?Your physician has recommended you make the following change in your medication: Decrease metoprolol tartrate to 50 mg in the morning and 75 mg in the evening ? ?*If you need a refill on your cardiac medications before your next appointment, please call your pharmacy* ? ? ?Lab Work: ?none ?If you have labs (blood work) drawn today and your tests are completely normal, you will receive your results only by: ?MyChart Message (if you have MyChart) OR ?A paper copy in the mail ?If you have any lab test that is abnormal or we need to change your treatment, we will call you to review the results. ? ? ?Testing/Procedures: ?none ? ? ?Follow-Up: ?At [facility], you and your health needs are our priority.  As part of our continuing mission to provide you with exceptional heart care, we have created designated Provider Care Teams.  These Care Teams include your primary Cardiologist (physician) and Advanced Practice Providers (APPs -  Physician Assistants and Nurse Practitioners) who all work together to provide you with the care you need, when you need it. ? ?We recommend signing up for the patient portal called "MyChart".  Sign up information is provided on this After Visit Summary.  MyChart is used to connect with patients for Virtual Visits (Telemedicine).  Patients are able to view lab/test results, encounter notes, upcoming appointments, etc.  Non-urgent messages can be sent to your provider as well.   ?To learn more about what you can do with MyChart, go to [email or web address].   ? ?Your next appointment:   ?06/13/22 at 9:00 ? ?The format for your next appointment:   ?In Person ? ?Provider:   ?Lance Muss, MD   ? ? ?Other Instructions ?  ? ?

## 2022-02-14 ENCOUNTER — Telehealth: Payer: Self-pay | Admitting: Interventional Cardiology

## 2022-02-14 NOTE — Telephone Encounter (Signed)
Kimberly Calhoun, RPH-CPP  02/09/2022  1:50 PM EDT     Pt currently taking ezetimibe, previously intolerant to statins including rosuvastatin 5mg  a few days a week. She declined PCSK9i when I saw her last fall and her insurance doesn't cover Leqvio well. If she'd like to discuss PCSK9i again I'd be happy to see her in clinic   Patient notified.  She developed muscle pain in legs and was advised by PCP to stop Zetia.  Since stopping leg pain has improved.  She would like to discuss PCSK9i with pharmacist.  Appointment scheduled in lipid clinic for March 12, 2022.

## 2022-02-14 NOTE — Telephone Encounter (Signed)
Pt returning and transferred to Genice Rouge, RN call.

## 2022-02-14 NOTE — Telephone Encounter (Signed)
-----   Message from Corky Crafts, MD sent at 02/09/2022  1:43 PM EDT ----- LDL well above target.  Needs consideration for PCSK9

## 2022-02-19 ENCOUNTER — Other Ambulatory Visit: Payer: Self-pay | Admitting: Interventional Cardiology

## 2022-02-19 DIAGNOSIS — I4891 Unspecified atrial fibrillation: Secondary | ICD-10-CM

## 2022-02-19 NOTE — Telephone Encounter (Signed)
Prescription refill request for Eliquis received. Indication: Afib  Last office visit: 12/11/21 Eldridge Dace)  Scr: 1.14 (07/05/21)  Age: 81 Weight: 61.2kg  Per dosing criteria, pt should be on 5mg  BID; however, on 09/11/21 pt's dose was decreased due to weight <60kg. Sent to 09/13/21, PharmD to review.

## 2022-02-19 NOTE — Telephone Encounter (Signed)
Called pt and verified weight. Pt stated she weighs every morning and her weight varies between 130lbs and 133lbs (60.4kg). Per Laural Golden, PharmD pt should remain on 2.5mg  BID.   Appropriate dose and refill sent to requested pharmacy.

## 2022-03-12 ENCOUNTER — Ambulatory Visit: Payer: Medicare Other

## 2022-04-08 NOTE — Progress Notes (Signed)
[facility] [phone]   Fax:([phone]  PROGRESS NOTE  Patient Care Team: Ileana Ladd, MD (Inactive) as PCP - General (Family Medicine) Corky Crafts, MD as PCP - Cardiology (Cardiology)  Hematological/Oncological History # Thrombocytosis 1) Labs from PCP, Dr. Leodis Sias: -05/17/2021: WBC 9.8, Hgb 11.7 (L), MCV 87.7, Plt 564 (H) 2) Labs from ED visit: -06/15/2021: WBC 12.5 (H), Hgb 10.9 (L), MCV 87.9, Plt 577 (H) 3) 07/05/2021: Establish care with [facility] Hematology/Oncology.  -Found to have iron deficiency, recommended to start ferrous sulfate 325 mg once daily   HISTORY OF PRESENTING ILLNESS:  Kimberly Calhoun 81 y.o. female returns for a follow up for thrombocytosis and iron deficiency anemia.   On exam today, Ms. Papa reports *** She denies fevers, chills, night sweats, shortness of breath,chest pain, cough, headaches or dizziness. She has no other complaints. Rest of the 10 point ROS is below.   MEDICAL HISTORY:  Past Medical History:  Diagnosis Date   Arthritis    Asthma    CAD (coronary artery disease)    s/p CABG   Chronic kidney disease, stage 3 (HCC)    Dyslipidemia 05/13/2021   Headache    Hypercholesteremia    Hyperlipidemia    Osteopenia    Personal history of colonic polyps    Pleural effusion    recurrent    SURGICAL HISTORY: Past Surgical History:  Procedure Laterality Date   ABDOMINAL HYSTERECTOMY  02/15/2013   prolapse   CARDIAC CATHETERIZATION     COLONOSCOPY WITH PROPOFOL N/A 12/07/2014   Procedure: COLONOSCOPY WITH PROPOFOL;  Surgeon: Bernette Redbird, MD;  Location: [facility];  Service: Endoscopy;  Laterality: N/A;  ultraslim   CORONARY ARTERY BYPASS GRAFT N/A 03/24/2021   Procedure: CORONARY ARTERY BYPASS GRAFTING (CABG) TIMES THREE USING LEFT INTERNAL MAMMARY ARTERY AND RIGHT GREATER SAPHENOUS VEIN HARVESTED ENDOSCOPICALLY;  Surgeon: Linden Dolin, MD;  Location: [facility] OR;  Service: Open Heart  Surgery;  Laterality: N/A;   IR THORACENTESIS ASP PLEURAL SPACE W/IMG GUIDE  04/17/2021   LEFT HEART CATH AND CORONARY ANGIOGRAPHY N/A 03/22/2021   Procedure: LEFT HEART CATH AND CORONARY ANGIOGRAPHY;  Surgeon: Lennette Bihari, MD;  Location: [facility] INVASIVE CV LAB;  Service: Cardiovascular;  Laterality: N/A;   TEE WITHOUT CARDIOVERSION N/A 03/24/2021   Procedure: TRANSESOPHAGEAL ECHOCARDIOGRAM (TEE);  Surgeon: Linden Dolin, MD;  Location: [facility] OR;  Service: Open Heart Surgery;  Laterality: N/A;    SOCIAL HISTORY: Social History   Socioeconomic History   Marital status: Widowed    Spouse name: Not on file   Number of children: 1   Years of education: 57   Highest education level: Not on file  Occupational History   Occupation: retired  Tobacco Use   Smoking status: Never   Smokeless tobacco: Never  Vaping Use   Vaping Use: Never used  Substance and Sexual Activity   Alcohol use: No   Drug use: No   Sexual activity: Not Currently  Other Topics Concern   Not on file  Social History Narrative   Not on file   Social Determinants of Health   Financial Resource Strain: Not on file  Food Insecurity: Not on file  Transportation Needs: Not on file  Physical Activity: Not on file  Stress: Not on file  Social Connections: Not on file  Intimate Partner Violence: Not on file    FAMILY HISTORY: Family History  Problem Relation Age of Onset   Cancer - Colon Mother  Heart disease Father    Atrial fibrillation Sister    Rheum arthritis Sister    Diabetes Brother    Breast cancer Neg Hx     ALLERGIES:  is allergic to amiodarone, macrobid [nitrofurantoin], mold extract [trichophyton], other, statins, and zyrtec [cetirizine hcl].  MEDICATIONS:  Current Outpatient Medications  Medication Sig Dispense Refill   apixaban (ELIQUIS) 2.5 MG TABS tablet Take 1 tablet (2.5 mg total) by mouth 2 (two) times daily. 60 tablet 2   acetaminophen (TYLENOL) 500 MG tablet Take 500-1,000 mg by  mouth every 6 (six) hours as needed for moderate pain or headache.     albuterol (VENTOLIN HFA) 108 (90 Base) MCG/ACT inhaler Inhale 1 puff into the lungs every 6 (six) hours as needed for shortness of breath.     aspirin 81 MG EC tablet Take 81 mg by mouth daily. Swallow whole.     CALCIUM PO Take 1 tablet by mouth daily.     Cholecalciferol (VITAMIN D PO) Take 1 tablet by mouth every morning.     ezetimibe (ZETIA) 10 MG tablet Take 1 tablet (10 mg total) by mouth daily. 30 tablet 11   ferrous sulfate 325 (65 FE) MG EC tablet Take 1 tablet (325 mg total) by mouth daily with breakfast. 30 tablet 3   fluticasone (FLONASE) 50 MCG/ACT nasal spray Place 1 spray into both nostrils daily as needed for allergies.     metoprolol tartrate (LOPRESSOR) 25 MG tablet Take 2 tablets (50 mg) every AM and 3 tablets (75 mg ) every PM 450 tablet 3   Multiple Vitamin (MULTIVITAMIN WITH MINERALS) TABS tablet Take 1 tablet by mouth at bedtime. Centrum silver.     Propylene Glycol (SYSTANE BALANCE OP) Apply 1 drop to eye every 4 (four) hours as needed (dry eyes.).     vitamin C (ASCORBIC ACID) 500 MG tablet Take 500 mg by mouth every morning.     vitamin E 180 MG (400 UNITS) capsule Take 1 capsule (400 Units total) by mouth every morning.     zinc gluconate 50 MG tablet Take 50 mg by mouth daily.     No current facility-administered medications for this visit.    REVIEW OF SYSTEMS:   Constitutional: ( - ) fevers, ( - )  chills , ( - ) night sweats Eyes: ( - ) blurriness of vision, ( - ) double vision, ( - ) watery eyes Ears, nose, mouth, throat, and face: ( - ) mucositis, ( - ) sore throat Respiratory: ( - ) cough, ( - ) dyspnea, ( - ) wheezes Cardiovascular: ( - ) palpitation, ( - ) chest discomfort, ( - ) lower extremity swelling Gastrointestinal:  ( - ) nausea, ( - ) heartburn, ( - ) change in bowel habits Skin: ( - ) abnormal skin rashes Lymphatics: ( - ) new lymphadenopathy, ( - ) easy  bruising Neurological: ( - ) numbness, ( - ) tingling, ( - ) new weaknesses Behavioral/Psych: ( - ) mood change, ( - ) new changes  All other systems were reviewed with the patient and are negative.  PHYSICAL EXAMINATION: ECOG PERFORMANCE STATUS: 0 - Asymptomatic  There were no vitals filed for this visit.  There were no vitals filed for this visit.   GENERAL: well appearing female in NAD  SKIN: skin color, texture, turgor are normal, no rashes or significant lesions EYES: conjunctiva are pink and non-injected, sclera clear OROPHARYNX: no exudate, no erythema; lips, buccal mucosa, and tongue normal  LUNGS: clear to auscultation and percussion with normal breathing effort HEART: regular rate & rhythm and no murmurs and no lower extremity edema ABDOMEN: soft, non-tender, non-distended, normal bowel sounds Musculoskeletal: no cyanosis of digits and no clubbing  PSYCH: alert & oriented x 3, fluent speech NEURO: no focal motor/sensory deficits  LABORATORY DATA:  I have reviewed the data as listed    Latest Ref Rng & Units 10/04/2021    3:07 PM 07/05/2021   11:59 AM 06/15/2021    7:39 AM  CBC  WBC 4.0 - 10.5 K/uL 8.6  9.4  12.5   Hemoglobin 12.0 - 15.0 g/dL 95.6  21.3  08.6   Hematocrit 36.0 - 46.0 % 35.4  36.7  33.3   Platelets 150 - 400 K/uL 416  453  577        Latest Ref Rng & Units 08/14/2021    8:45 AM 07/05/2021   11:59 AM 06/15/2021    7:39 AM  CMP  Glucose 70 - 99 mg/dL  94  98   BUN 8 - 23 mg/dL  20  24   Creatinine 5.78 - 1.00 mg/dL  4.69  6.29   Sodium 528 - 145 mmol/L  138  134   Potassium 3.5 - 5.1 mmol/L  4.4  3.9   Chloride 98 - 111 mmol/L  104  101   CO2 22 - 32 mmol/L  24  24   Calcium 8.9 - 10.3 mg/dL  41.3  9.2   Total Protein 6.0 - 8.5 g/dL 6.6  7.8    Total Bilirubin 0.0 - 1.2 mg/dL 0.2  0.2    Alkaline Phos 44 - 121 IU/L 181  144    AST 0 - 40 IU/L 25  19    ALT 0 - 32 IU/L 30  23     RADIOGRAPHIC STUDIES: I have personally reviewed the  radiological images as listed and agreed with the findings in the report. No results found.  ASSESSMENT & PLAN ELFRIEDA PONE is a 81 y.o. female who returns for a follow up for thrombocytosis and iron deficiency anemia.   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 Sed rate is still elevated at 48 mm/hr. Iron levels have improved.  --Patient reports that she is no longer taking ferrous sulfate 325 mg once daily as prescribed as her prescription expired. Recommend to continue to take once daily with a source of vitamin C. Sent refill today.  --RTC in 6 months with labs.   No orders of the defined types were placed in this encounter.   All questions were answered. The patient knows to call the clinic with any problems, questions or concerns.  I have spent a total of 25 minutes minutes of face-to-face and non-face-to-face time, preparing to see the patient, performing a medically appropriate examination, counseling and educating the patient, ordering medications/tests, documenting clinical information in the electronic health record, and care coordination.   Ulysees Barns, MD Department of Hematology/Oncology [facility] Phone: [phone] Pager: [phone] Email: [email or web address]

## 2022-04-09 ENCOUNTER — Inpatient Hospital Stay: Payer: Medicare Other | Attending: Family Medicine

## 2022-04-09 ENCOUNTER — Inpatient Hospital Stay: Payer: Medicare Other | Admitting: Hematology and Oncology

## 2022-04-19 ENCOUNTER — Telehealth: Payer: Self-pay | Admitting: Hematology and Oncology

## 2022-04-19 NOTE — Telephone Encounter (Signed)
Rescheduled missed appointment per patient's request. Patient is aware of changes. °

## 2022-05-14 ENCOUNTER — Other Ambulatory Visit: Payer: Self-pay

## 2022-05-14 ENCOUNTER — Inpatient Hospital Stay: Payer: Medicare Other

## 2022-05-14 ENCOUNTER — Inpatient Hospital Stay: Payer: Medicare Other | Attending: Hematology and Oncology | Admitting: Hematology and Oncology

## 2022-05-14 VITALS — BP 162/75 | HR 73 | Temp 97.6°F | Resp 18 | Wt 138.0 lb

## 2022-05-14 DIAGNOSIS — D509 Iron deficiency anemia, unspecified: Secondary | ICD-10-CM | POA: Diagnosis not present

## 2022-05-14 DIAGNOSIS — D75839 Thrombocytosis, unspecified: Secondary | ICD-10-CM

## 2022-05-14 DIAGNOSIS — Z79899 Other long term (current) drug therapy: Secondary | ICD-10-CM | POA: Insufficient documentation

## 2022-05-14 DIAGNOSIS — D508 Other iron deficiency anemias: Secondary | ICD-10-CM

## 2022-05-14 LAB — IRON AND IRON BINDING CAPACITY (CC-WL,HP ONLY)
Iron: 52 ug/dL (ref 28–170)
Saturation Ratios: 16 % (ref 10.4–31.8)
TIBC: 325 ug/dL (ref 250–450)
UIBC: 273 ug/dL (ref 148–442)

## 2022-05-14 LAB — SEDIMENTATION RATE: Sed Rate: 36 mm/hr — ABNORMAL HIGH (ref 0–22)

## 2022-05-14 LAB — CBC WITH DIFFERENTIAL (CANCER CENTER ONLY)
Abs Immature Granulocytes: 0.03 10*3/uL (ref 0.00–0.07)
Basophils Absolute: 0 10*3/uL (ref 0.0–0.1)
Basophils Relative: 1 %
Eosinophils Absolute: 0.2 10*3/uL (ref 0.0–0.5)
Eosinophils Relative: 3 %
HCT: 36.4 % (ref 36.0–46.0)
Hemoglobin: 12.3 g/dL (ref 12.0–15.0)
Immature Granulocytes: 0 %
Lymphocytes Relative: 24 %
Lymphs Abs: 2.1 10*3/uL (ref 0.7–4.0)
MCH: 30.1 pg (ref 26.0–34.0)
MCHC: 33.8 g/dL (ref 30.0–36.0)
MCV: 89 fL (ref 80.0–100.0)
Monocytes Absolute: 0.9 10*3/uL (ref 0.1–1.0)
Monocytes Relative: 10 %
Neutro Abs: 5.4 10*3/uL (ref 1.7–7.7)
Neutrophils Relative %: 62 %
Platelet Count: 330 10*3/uL (ref 150–400)
RBC: 4.09 MIL/uL (ref 3.87–5.11)
RDW: 13.8 % (ref 11.5–15.5)
WBC Count: 8.7 10*3/uL (ref 4.0–10.5)
nRBC: 0 % (ref 0.0–0.2)

## 2022-05-14 LAB — FERRITIN: Ferritin: 56 ng/mL (ref 11–307)

## 2022-05-14 NOTE — Progress Notes (Unsigned)
[facility] [phone]) [phone]   Fax:(336) [phone]  PROGRESS NOTE  Patient Care Team: Ileana Ladd, MD (Inactive) as PCP - General (Family Medicine) Corky Crafts, MD as PCP - Cardiology (Cardiology)  Hematological/Oncological History 1) Labs from PCP, Dr. Kathreen Devoid: -05/17/2021: WBC 9.8, Hgb 11.7 (L), MCV 87.7, Plt 564 (H)  2) Labs from ED visit: -06/15/2021: WBC 12.5 (H), Hgb 10.9 (L), MCV 87.9, Plt 577 (H)  3) 07/05/2021: Establish care with [facility] Hematology/Oncology.  -Found to have iron deficiency, recommended to start ferrous sulfate 325 mg once daily  CHIEF COMPLAINTS/PURPOSE OF CONSULTATION:  "Thrombocytosis"  HISTORY OF PRESENTING ILLNESS:  Kimberly Calhoun 81 y.o. female returns for a follow up for thrombocytosis and iron deficiency anemia.   On exam today, Ms. Loomer reports that her energy levels are unchanged. She tries to be active and complete her daily ADLs independently. She denies nausea, vomiting or abdominal pain. Her bowel movements are unchanged without diarrhea or constipation. She reports occasional episodes of nose bleeds and gum bleeding that she suspects is coming from the anticoagulation. She denies fevers, chills, night sweats, shortness of breath,chest pain, cough, headaches or dizziness. She has no other complaints. Rest of the 10 point ROS is below.   MEDICAL HISTORY:  Past Medical History:  Diagnosis Date   Arthritis    Asthma    CAD (coronary artery disease)    s/p CABG   Chronic kidney disease, stage 3 (HCC)    Dyslipidemia 05/13/2021   Headache    Hypercholesteremia    Hyperlipidemia    Osteopenia    Personal history of colonic polyps    Pleural effusion    recurrent    SURGICAL HISTORY: Past Surgical History:  Procedure Laterality Date   ABDOMINAL HYSTERECTOMY  02/15/2013   prolapse   CARDIAC CATHETERIZATION     COLONOSCOPY WITH PROPOFOL N/A 12/07/2014   Procedure: COLONOSCOPY WITH PROPOFOL;   Surgeon: Bernette Redbird, MD;  Location: [facility];  Service: Endoscopy;  Laterality: N/A;  ultraslim   CORONARY ARTERY BYPASS GRAFT N/A 03/24/2021   Procedure: CORONARY ARTERY BYPASS GRAFTING (CABG) TIMES THREE USING LEFT INTERNAL MAMMARY ARTERY AND RIGHT GREATER SAPHENOUS VEIN HARVESTED ENDOSCOPICALLY;  Surgeon: Linden Dolin, MD;  Location: [facility] OR;  Service: Open Heart Surgery;  Laterality: N/A;   IR THORACENTESIS ASP PLEURAL SPACE W/IMG GUIDE  04/17/2021   LEFT HEART CATH AND CORONARY ANGIOGRAPHY N/A 03/22/2021   Procedure: LEFT HEART CATH AND CORONARY ANGIOGRAPHY;  Surgeon: Lennette Bihari, MD;  Location: [facility] INVASIVE CV LAB;  Service: Cardiovascular;  Laterality: N/A;   TEE WITHOUT CARDIOVERSION N/A 03/24/2021   Procedure: TRANSESOPHAGEAL ECHOCARDIOGRAM (TEE);  Surgeon: Linden Dolin, MD;  Location: [facility] OR;  Service: Open Heart Surgery;  Laterality: N/A;    SOCIAL HISTORY: Social History   Socioeconomic History   Marital status: Widowed    Spouse name: Not on file   Number of children: 1   Years of education: 39   Highest education level: Not on file  Occupational History   Occupation: retired  Tobacco Use   Smoking status: Never   Smokeless tobacco: Never  Vaping Use   Vaping Use: Never used  Substance and Sexual Activity   Alcohol use: No   Drug use: No   Sexual activity: Not Currently  Other Topics Concern   Not on file  Social History Narrative   Not on file   Social Determinants of Health   Financial Resource Strain: Not on  file  Food Insecurity: Not on file  Transportation Needs: Not on file  Physical Activity: Not on file  Stress: Not on file  Social Connections: Not on file  Intimate Partner Violence: Not on file    FAMILY HISTORY: Family History  Problem Relation Age of Onset   Cancer - Colon Mother    Heart disease Father    Atrial fibrillation Sister    Rheum arthritis Sister    Diabetes Brother    Breast cancer Neg Hx     ALLERGIES:   is allergic to amiodarone, macrobid [nitrofurantoin], mold extract [trichophyton], other, statins, and zyrtec [cetirizine hcl].  MEDICATIONS:  Current Outpatient Medications  Medication Sig Dispense Refill   acetaminophen (TYLENOL) 500 MG tablet Take 500-1,000 mg by mouth every 6 (six) hours as needed for moderate pain or headache.     albuterol (VENTOLIN HFA) 108 (90 Base) MCG/ACT inhaler Inhale 1 puff into the lungs every 6 (six) hours as needed for shortness of breath.     apixaban (ELIQUIS) 2.5 MG TABS tablet Take 1 tablet (2.5 mg total) by mouth 2 (two) times daily. 60 tablet 2   aspirin 81 MG EC tablet Take 81 mg by mouth daily. Swallow whole.     CALCIUM PO Take 1 tablet by mouth daily.     Cholecalciferol (VITAMIN D PO) Take 1 tablet by mouth every morning.     fluticasone (FLONASE) 50 MCG/ACT nasal spray Place 1 spray into both nostrils daily as needed for allergies.     metoprolol tartrate (LOPRESSOR) 25 MG tablet Take 2 tablets (50 mg) every AM and 3 tablets (75 mg ) every PM 450 tablet 3   Multiple Vitamin (MULTIVITAMIN WITH MINERALS) TABS tablet Take 1 tablet by mouth at bedtime. Centrum silver.     Propylene Glycol (SYSTANE BALANCE OP) Apply 1 drop to eye every 4 (four) hours as needed (dry eyes.).     vitamin C (ASCORBIC ACID) 500 MG tablet Take 500 mg by mouth every morning.     vitamin E 180 MG (400 UNITS) capsule Take 1 capsule (400 Units total) by mouth every morning.     zinc gluconate 50 MG tablet Take 50 mg by mouth daily.     No current facility-administered medications for this visit.    REVIEW OF SYSTEMS:   Constitutional: ( - ) fevers, ( - )  chills , ( - ) night sweats Eyes: ( - ) blurriness of vision, ( - ) double vision, ( - ) watery eyes Ears, nose, mouth, throat, and face: ( - ) mucositis, ( - ) sore throat Respiratory: ( - ) cough, ( - ) dyspnea, ( - ) wheezes Cardiovascular: ( - ) palpitation, ( - ) chest discomfort, ( - ) lower extremity  swelling Gastrointestinal:  ( - ) nausea, ( - ) heartburn, ( - ) change in bowel habits Skin: ( - ) abnormal skin rashes Lymphatics: ( - ) new lymphadenopathy, ( - ) easy bruising Neurological: ( - ) numbness, ( - ) tingling, ( - ) new weaknesses Behavioral/Psych: ( - ) mood change, ( - ) new changes  All other systems were reviewed with the patient and are negative.  PHYSICAL EXAMINATION: ECOG PERFORMANCE STATUS: 0 - Asymptomatic  Vitals:   05/14/22 1321  BP: (!) 162/75  Pulse: 73  Resp: 18  Temp: 97.6 F (36.4 C)  SpO2: 97%   Filed Weights   05/14/22 1321  Weight: 138 lb (62.6 kg)  GENERAL: well appearing female in NAD  SKIN: skin color, texture, turgor are normal, no rashes or significant lesions EYES: conjunctiva are pink and non-injected, sclera clear OROPHARYNX: no exudate, no erythema; lips, buccal mucosa, and tongue normal  LUNGS: clear to auscultation and percussion with normal breathing effort HEART: regular rate & rhythm and no murmurs and no lower extremity edema ABDOMEN: soft, non-tender, non-distended, normal bowel sounds Musculoskeletal: no cyanosis of digits and no clubbing  PSYCH: alert & oriented x 3, fluent speech NEURO: no focal motor/sensory deficits  LABORATORY DATA:  I have reviewed the data as listed    Latest Ref Rng & Units 05/14/2022   12:56 PM 10/04/2021    3:07 PM 07/05/2021   11:59 AM  CBC  WBC 4.0 - 10.5 K/uL 8.7  8.6  9.4   Hemoglobin 12.0 - 15.0 g/dL 37.8  58.8  50.2   Hematocrit 36.0 - 46.0 % 36.4  35.4  36.7   Platelets 150 - 400 K/uL 330  416  453        Latest Ref Rng & Units 08/14/2021    8:45 AM 07/05/2021   11:59 AM 06/15/2021    7:39 AM  CMP  Glucose 70 - 99 mg/dL  94  98   BUN 8 - 23 mg/dL  20  24   Creatinine 7.74 - 1.00 mg/dL  1.28  7.86   Sodium 767 - 145 mmol/L  138  134   Potassium 3.5 - 5.1 mmol/L  4.4  3.9   Chloride 98 - 111 mmol/L  104  101   CO2 22 - 32 mmol/L  24  24   Calcium 8.9 - 10.3 mg/dL  20.9   9.2   Total Protein 6.0 - 8.5 g/dL 6.6  7.8    Total Bilirubin 0.0 - 1.2 mg/dL 0.2  0.2    Alkaline Phos 44 - 121 IU/L 181  144    AST 0 - 40 IU/L 25  19    ALT 0 - 32 IU/L 30  23     RADIOGRAPHIC STUDIES: I have personally reviewed the radiological images as listed and agreed with the findings in the report. No results found.  ASSESSMENT & PLAN Kimberly Calhoun is a 81 y.o. female who returns for a follow up for thrombocytosis and iron deficiency anemia.   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 Sed rate is still elevated at 48 mm/hr. Iron levels have improved.  --Patient reports that she is no longer taking ferrous sulfate 325 mg once daily as prescribed as her prescription expired. Recommend to continue to take once daily with a source of vitamin C. Sent refill today.  --RTC in 6 months with labs.   No orders of the defined types were placed in this encounter.   All questions were answered. The patient knows to call the clinic with any problems, questions or concerns.  I have spent a total of 25 minutes minutes of face-to-face and non-face-to-face time, preparing to see the patient, performing a medically appropriate examination, counseling and educating the patient, ordering medications/tests, documenting clinical information in the electronic health record, and care coordination.   Georga Kaufmann, PA-C Department of Hematology/Oncology [facility] Phone: [phone]

## 2022-05-22 DIAGNOSIS — Z961 Presence of intraocular lens: Secondary | ICD-10-CM | POA: Diagnosis not present

## 2022-05-22 DIAGNOSIS — H353221 Exudative age-related macular degeneration, left eye, with active choroidal neovascularization: Secondary | ICD-10-CM | POA: Diagnosis not present

## 2022-05-29 DIAGNOSIS — H353221 Exudative age-related macular degeneration, left eye, with active choroidal neovascularization: Secondary | ICD-10-CM | POA: Diagnosis not present

## 2022-05-29 DIAGNOSIS — Z961 Presence of intraocular lens: Secondary | ICD-10-CM | POA: Diagnosis not present

## 2022-05-31 ENCOUNTER — Other Ambulatory Visit: Payer: Self-pay | Admitting: Interventional Cardiology

## 2022-05-31 DIAGNOSIS — I4891 Unspecified atrial fibrillation: Secondary | ICD-10-CM

## 2022-05-31 NOTE — Telephone Encounter (Signed)
Prescription refill request for Eliquis received. Indication:Afib Last office visit:3/23 Scr:0.9 Age: 81 Weight:62.6 kg  Prescription refilled

## 2022-06-11 ENCOUNTER — Ambulatory Visit: Payer: Medicare Other | Admitting: Pulmonary Disease

## 2022-06-11 ENCOUNTER — Encounter: Payer: Self-pay | Admitting: Pulmonary Disease

## 2022-06-11 VITALS — BP 110/70 | HR 73 | Ht 62.0 in | Wt 138.8 lb

## 2022-06-11 DIAGNOSIS — J9383 Other pneumothorax: Secondary | ICD-10-CM

## 2022-06-11 DIAGNOSIS — Z951 Presence of aortocoronary bypass graft: Secondary | ICD-10-CM | POA: Diagnosis not present

## 2022-06-11 DIAGNOSIS — J90 Pleural effusion, not elsewhere classified: Secondary | ICD-10-CM | POA: Diagnosis not present

## 2022-06-11 DIAGNOSIS — J9819 Other pulmonary collapse: Secondary | ICD-10-CM | POA: Diagnosis not present

## 2022-06-11 DIAGNOSIS — Z7901 Long term (current) use of anticoagulants: Secondary | ICD-10-CM | POA: Diagnosis not present

## 2022-06-11 DIAGNOSIS — I480 Paroxysmal atrial fibrillation: Secondary | ICD-10-CM | POA: Diagnosis not present

## 2022-06-11 NOTE — Progress Notes (Signed)
Synopsis: Referred in August 2022 for pleural effusion by Dr. Irving Copas, PCP: by No ref. provider found  Subjective:   PATIENT ID: Kimberly Anis GENDER: female DOB: 1941/06/22, MRN: [id number]  Chief Complaint  Patient presents with   Follow-up    81 yo FM, PMH CAD s/p CABG, CKD, HLD. Presents to the office s/p cabg with recurrent left sided effusion. She was on eliquis that was held for todays office visit with plans for thoracentesis. From a respiratory standpoint she is feeling ok but she notices SOB and DOE at times.   OV 06/07/2021: Here today for evaluation of recurrent left-sided pleural effusion.  Patient unfortunately has had reaccumulation of this effusion and feels short of breath still.  Also has recently seen cardiology.  Office note reviewed.  Case discussed with Rikki Spearing, NP that saw patient in the office.  OV 08/02/2021: Here today for follow-up.  She states that she was out dealing with leaves working in her yard developed cough and shortness of breath.  She thought it was an reaction to her allergies.  She been doing okay since then but is taken some time for her to recover.  She feels much better than she did in the past.  Last chest x-ray we reviewed.  Showed ex vacuo pneumothorax after left-sided pleural effusion drained.  We talked about this today in the office.  OV 06/11/2022 here today for follow-up.  Originally saw all 4 post CABG pleural effusion status post thoracentesis.  Here today for follow-up.Has questions or concerns wondering if she can fly now. Doing well today. She had a cxr in [place] with minimal fluid reaccumulation.     Past Medical History:  Diagnosis Date   Arthritis    Asthma    CAD (coronary artery disease)    s/p CABG   Chronic kidney disease, stage 3 (HCC)    Dyslipidemia 05/13/2021   Headache    Hypercholesteremia    Hyperlipidemia    Osteopenia    Personal history of colonic polyps    Pleural effusion    recurrent     Family  History  Problem Relation Age of Onset   Cancer - Colon Mother    Heart disease Father    Atrial fibrillation Sister    Rheum arthritis Sister    Diabetes Brother    Breast cancer Neg Hx      Past Surgical History:  Procedure Laterality Date   ABDOMINAL HYSTERECTOMY  02/15/2013   prolapse   CARDIAC CATHETERIZATION     COLONOSCOPY WITH PROPOFOL N/A 12/07/2014   Procedure: COLONOSCOPY WITH PROPOFOL;  Surgeon: Bernette Redbird, MD;  Location: [facility];  Service: Endoscopy;  Laterality: N/A;  ultraslim   CORONARY ARTERY BYPASS GRAFT N/A 03/24/2021   Procedure: CORONARY ARTERY BYPASS GRAFTING (CABG) TIMES THREE USING LEFT INTERNAL MAMMARY ARTERY AND RIGHT GREATER SAPHENOUS VEIN HARVESTED ENDOSCOPICALLY;  Surgeon: Linden Dolin, MD;  Location: [facility] OR;  Service: Open Heart Surgery;  Laterality: N/A;   IR THORACENTESIS ASP PLEURAL SPACE W/IMG GUIDE  04/17/2021   LEFT HEART CATH AND CORONARY ANGIOGRAPHY N/A 03/22/2021   Procedure: LEFT HEART CATH AND CORONARY ANGIOGRAPHY;  Surgeon: Lennette Bihari, MD;  Location: [facility];  Service: Cardiovascular;  Laterality: N/A;   TEE WITHOUT CARDIOVERSION N/A 03/24/2021   Procedure: TRANSESOPHAGEAL ECHOCARDIOGRAM (TEE);  Surgeon: Linden Dolin, MD;  Location: [facility] OR;  Service: Open Heart Surgery;  Laterality: N/A;    Social History   Socioeconomic History  Marital status: Widowed    Spouse name: Not on file   Number of children: 1   Years of education: 38   Highest education level: Not on file  Occupational History   Occupation: retired  Tobacco Use   Smoking status: Never   Smokeless tobacco: Never  Vaping Use   Vaping Use: Never used  Substance and Sexual Activity   Alcohol use: No   Drug use: No   Sexual activity: Not Currently  Other Topics Concern   Not on file  Social History Narrative   Not on file   Social Determinants of Health   Financial Resource Strain: Not on file  Food Insecurity: Not on file   Transportation Needs: Not on file  Physical Activity: Not on file  Stress: Not on file  Social Connections: Not on file  Intimate Partner Violence: Not on file     Allergies  Allergen Reactions   Amiodarone Nausea And Vomiting   Macrobid [Nitrofurantoin] Other (See Comments)    Cold chills, muscle weakness.   Mold Extract [Trichophyton] Other (See Comments)    Allergies.    Other Other (See Comments)    Allergies.   Mold in Cheese - stuffy nose and headache  Chocolate- stuffy nose and headache  Mildew - stuffy nose and headache   Statins Other (See Comments)    Muscle pain   Zyrtec [Cetirizine Hcl] Other (See Comments)    PACs     Outpatient Medications Prior to Visit  Medication Sig Dispense Refill   albuterol (VENTOLIN HFA) 108 (90 Base) MCG/ACT inhaler Inhale 1 puff into the lungs every 6 (six) hours as needed for shortness of breath.     acetaminophen (TYLENOL) 500 MG tablet Take 500-1,000 mg by mouth every 6 (six) hours as needed for moderate pain or headache.     apixaban (ELIQUIS) 2.5 MG TABS tablet TAKE 1 TABLET(2.5 MG) BY MOUTH TWICE DAILY 60 tablet 5   aspirin 81 MG EC tablet Take 81 mg by mouth daily. Swallow whole.     CALCIUM PO Take 1 tablet by mouth daily.     Cholecalciferol (VITAMIN D PO) Take 1 tablet by mouth every morning.     fluticasone (FLONASE) 50 MCG/ACT nasal spray Place 1 spray into both nostrils daily as needed for allergies. (Patient not taking: Reported on 06/11/2022)     metoprolol tartrate (LOPRESSOR) 25 MG tablet Take 2 tablets (50 mg) every AM and 3 tablets (75 mg ) every PM 450 tablet 3   Multiple Vitamin (MULTIVITAMIN WITH MINERALS) TABS tablet Take 1 tablet by mouth at bedtime. Centrum silver.     Propylene Glycol (SYSTANE BALANCE OP) Apply 1 drop to eye every 4 (four) hours as needed (dry eyes.).     vitamin C (ASCORBIC ACID) 500 MG tablet Take 500 mg by mouth every morning.     vitamin E 180 MG (400 UNITS) capsule Take 1 capsule (400  Units total) by mouth every morning.     zinc gluconate 50 MG tablet Take 50 mg by mouth daily.     No facility-administered medications prior to visit.    Review of Systems  Constitutional:  Negative for chills, fever, malaise/fatigue and weight loss.  HENT:  Negative for hearing loss, sore throat and tinnitus.        Recent stuffiness related to allergies  Eyes:  Negative for blurred vision and double vision.  Respiratory:  Negative for cough, hemoptysis, sputum production, shortness of breath, wheezing and stridor.  Cardiovascular:  Negative for chest pain, palpitations, orthopnea, leg swelling and PND.  Gastrointestinal:  Negative for abdominal pain, constipation, diarrhea, heartburn, nausea and vomiting.  Genitourinary:  Negative for dysuria, hematuria and urgency.  Musculoskeletal:  Negative for joint pain and myalgias.  Skin:  Negative for itching and rash.  Neurological:  Negative for dizziness, tingling, weakness and headaches.  Endo/Heme/Allergies:  Negative for environmental allergies. Does not bruise/bleed easily.  Psychiatric/Behavioral:  Negative for depression. The patient is not nervous/anxious and does not have insomnia.   All other systems reviewed and are negative.    Objective:  Physical Exam Vitals reviewed.  Constitutional:      General: She is not in acute distress.    Appearance: She is well-developed.  HENT:     Head: Normocephalic and atraumatic.  Eyes:     General: No scleral icterus.    Conjunctiva/sclera: Conjunctivae normal.     Pupils: Pupils are equal, round, and reactive to light.  Neck:     Vascular: No JVD.     Trachea: No tracheal deviation.  Cardiovascular:     Rate and Rhythm: Normal rate and regular rhythm.     Heart sounds: Normal heart sounds. No murmur heard. Pulmonary:     Effort: Pulmonary effort is normal. No tachypnea, accessory muscle usage or respiratory distress.     Breath sounds: No stridor. No wheezing, rhonchi or rales.   Abdominal:     General: There is no distension.     Palpations: Abdomen is soft.     Tenderness: There is no abdominal tenderness.  Musculoskeletal:        General: No tenderness.     Cervical back: Neck supple.  Lymphadenopathy:     Cervical: No cervical adenopathy.  Skin:    General: Skin is warm and dry.     Capillary Refill: Capillary refill takes less than 2 seconds.     Findings: No rash.  Neurological:     Mental Status: She is alert and oriented to person, place, and time.  Psychiatric:        Behavior: Behavior normal.      Vitals:   06/11/22 1059  BP: 110/70  Pulse: 73  SpO2: 96%  Weight: 138 lb 12.8 oz (63 kg)  Height: 5\' 2"  (1.575 m)     96% on RA BMI Readings from Last 3 Encounters:  06/11/22 25.39 kg/m  05/14/22 25.24 kg/m  12/11/21 24.69 kg/m   Wt Readings from Last 3 Encounters:  06/11/22 138 lb 12.8 oz (63 kg)  05/14/22 138 lb (62.6 kg)  12/11/21 135 lb (61.2 kg)     CBC    Component Value Date/Time   WBC 8.7 05/14/2022 1256   WBC 12.5 (H) 06/15/2021 0739   RBC 4.09 05/14/2022 1256   HGB 12.3 05/14/2022 1256   HCT 36.4 05/14/2022 1256   PLT 330 05/14/2022 1256   MCV 89.0 05/14/2022 1256   MCH 30.1 05/14/2022 1256   MCHC 33.8 05/14/2022 1256   RDW 13.8 05/14/2022 1256   LYMPHSABS 2.1 05/14/2022 1256   MONOABS 0.9 05/14/2022 1256   EOSABS 0.2 05/14/2022 1256   BASOSABS 0.0 05/14/2022 1256     Chest Imaging: CXR 05/05/2021: Left sided effusion The patient's images have been independently reviewed by me.    January 2023: Chest x-ray: Small amount of left-sided pleural effusion, pleural thickening Significant proving compared to previous chest x-rays  Pulmonary Functions Testing Results:     No data to display  FeNO:   Pathology:   Echocardiogram:   Heart Catheterization:     Assessment & Plan:     ICD-10-CM   1. S/P CABG (coronary artery bypass graft)  Z95.1     2. 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 Pleural effusion, left  J90     5. On continuous oral anticoagulation  Z79.01     6. Paroxysmal atrial fibrillation (HCC)  I48.0       Discussion:  This is an 81 year old female seen initially for recurrent left-sided pleural effusion status post multiple thoracentesis found to have trapped lung.  She had an ex vacuo pneumothorax.  Follow-up chest imaging shows that a portion of this has started to resolve she still has a small amount of pleural fluid and pleural thickening on the left side.  History of atrial fibrillation on oral anticoagulation.  Plan: She has no limitations from my standpoint for being able to return to flying or traveling. She otherwise is feeling fine today. She is and let us know if any of her symptoms change. Pulmonary will be available to her.  She is on call us if she needs to see [place] in the future.    Current Outpatient Medications:    albuterol (VENTOLIN HFA) 108 (90 Base) MCG/ACT inhaler, Inhale 1 puff into the lungs every 6 (six) hours as needed for shortness of breath., Disp: , Rfl:    acetaminophen (TYLENOL) 500 MG tablet, Take 500-1,000 mg by mouth every 6 (six) hours as needed for moderate pain or headache., Disp: , Rfl:    apixaban (ELIQUIS) 2.5 MG TABS tablet, TAKE 1 TABLET(2.5 MG) BY MOUTH TWICE DAILY, Disp: 60 tablet, Rfl: 5   aspirin 81 MG EC tablet, Take 81 mg by mouth daily. Swallow whole., Disp: , Rfl:    CALCIUM PO, Take 1 tablet by mouth daily., Disp: , Rfl:    Cholecalciferol (VITAMIN D PO), Take 1 tablet by mouth every morning., Disp: , Rfl:    fluticasone (FLONASE) 50 MCG/ACT nasal spray, Place 1 spray into both nostrils daily as needed for allergies. (Patient not taking: Reported on 06/11/2022), Disp: , Rfl:    metoprolol tartrate (LOPRESSOR) 25 MG tablet, Take 2 tablets (50 mg) every AM and 3 tablets (75 mg ) every PM, Disp: 450 tablet, Rfl: 3   Multiple Vitamin (MULTIVITAMIN WITH MINERALS) TABS tablet,  Take 1 tablet by mouth at bedtime. Centrum silver., Disp: , Rfl:    Propylene Glycol (SYSTANE BALANCE OP), Apply 1 drop to eye every 4 (four) hours as needed (dry eyes.)., Disp: , Rfl:    vitamin C (ASCORBIC ACID) 500 MG tablet, Take 500 mg by mouth every morning., Disp: , Rfl:    vitamin E 180 MG (400 UNITS) capsule, Take 1 capsule (400 Units total) by mouth every morning., Disp: , Rfl:    zinc gluconate 50 MG tablet, Take 50 mg by mouth daily., Disp: , Rfl:    Josephine Igo, DO [facility] 06/11/2022 11:16 AM

## 2022-06-11 NOTE — Patient Instructions (Signed)
Thank you for visiting Dr. Saffron Busey at [facility]. Today we recommend the following:  Return if symptoms worsen or fail to improve.    Please do your part to reduce the spread of COVID-19.  

## 2022-06-13 ENCOUNTER — Encounter: Payer: Self-pay | Admitting: Interventional Cardiology

## 2022-06-13 ENCOUNTER — Ambulatory Visit: Payer: Medicare Other | Attending: Interventional Cardiology | Admitting: Interventional Cardiology

## 2022-06-13 VITALS — BP 122/64 | HR 71 | Ht 62.0 in | Wt 139.0 lb

## 2022-06-13 DIAGNOSIS — I480 Paroxysmal atrial fibrillation: Secondary | ICD-10-CM | POA: Diagnosis not present

## 2022-06-13 DIAGNOSIS — Z6823 Body mass index (BMI) 23.0-23.9, adult: Secondary | ICD-10-CM | POA: Diagnosis not present

## 2022-06-13 DIAGNOSIS — R2689 Other abnormalities of gait and mobility: Secondary | ICD-10-CM | POA: Diagnosis not present

## 2022-06-13 DIAGNOSIS — E782 Mixed hyperlipidemia: Secondary | ICD-10-CM

## 2022-06-13 DIAGNOSIS — H9190 Unspecified hearing loss, unspecified ear: Secondary | ICD-10-CM | POA: Diagnosis not present

## 2022-06-13 DIAGNOSIS — D6869 Other thrombophilia: Secondary | ICD-10-CM

## 2022-06-13 DIAGNOSIS — J302 Other seasonal allergic rhinitis: Secondary | ICD-10-CM | POA: Diagnosis not present

## 2022-06-13 DIAGNOSIS — I25708 Atherosclerosis of coronary artery bypass graft(s), unspecified, with other forms of angina pectoris: Secondary | ICD-10-CM | POA: Diagnosis not present

## 2022-06-13 DIAGNOSIS — E785 Hyperlipidemia, unspecified: Secondary | ICD-10-CM | POA: Diagnosis not present

## 2022-06-13 MED ORDER — ASPIRIN 81 MG PO TBEC: DELAYED_RELEASE_TABLET | ORAL | 3 refills | Status: DC

## 2022-06-13 MED ORDER — METOPROLOL TARTRATE 50 MG PO TABS: 50.0000 mg | ORAL_TABLET | Freq: Two times a day (BID) | ORAL | 3 refills | Status: DC

## 2022-06-13 NOTE — Progress Notes (Signed)
Cardiology Office Note   Date:  06/13/2022   ID:  Kimberly Calhoun, DOB 1941/01/11, MRN [id number]  PCP:  Lurline Del, DO    No chief complaint on file.  CAD  Wt Readings from Last 3 Encounters:  06/13/22 139 lb (63 kg)  06/11/22 138 lb 12.8 oz (63 kg)  05/14/22 138 lb (62.6 kg)       History of Present Illness: Kimberly Calhoun is a 81 y.o. female  with CAD.   Her husband was my patient, Corvette Orser, and he passed away in Sep 09, 2019.    In Dec 07, 2020, she was having severe allergies.  She felt some chest wall muscle spasms which improved with treatment from a chiropractor.     She also had some DOE.  Worse when she had a new cat.  better since she got rid of the cat.  She thought it was allergy related.     She had unstable angina in July 2022 which led to cath showing multivessel disease.  "Coronary Artery Bypass Grafting x 3             Left Internal Mammary Artery to Distal Left Anterior Descending Coronary Artery, Saphenous Vein Graft to Posterior Descending Coronary Artery, Saphenous Vein Graft to  Diagonal Branch Coronary Artery, Endoscopic Vein Harvest from right thigh and Lower Leg"   She had post op AFib and pleural effusion, thoracentesis x 2.      Echo in 04/2021 showed: "Echo results as noted:  1. Left ventricular ejection fraction, by estimation, is 65 to 70%. The  left ventricle has normal function. The left ventricle has no regional  wall motion abnormalities. Left ventricular diastolic parameters are  consistent with Grade I diastolic  dysfunction (impaired relaxation). Elevated left ventricular end-diastolic  pressure.   2. Right ventricular systolic function is mildly reduced. The right  ventricular size is normal. There is normal pulmonary artery systolic  pressure. The estimated right ventricular systolic pressure is 48.1 mmHg.   3. Large pleural effusion in the left lateral region.   4. The mitral valve is normal in structure. No evidence of mitral  valve  regurgitation. No evidence of mitral stenosis.   5. The aortic valve is tricuspid. Aortic valve regurgitation is not  visualized. Mild aortic valve sclerosis is present, with no evidence of  aortic valve stenosis.   6. The inferior vena cava is normal in size with greater than 50%  respiratory variability, suggesting right atrial pressure of 3 mmHg. "   She has had pleural effusions and is followed by pulmonary.  She had been hesitant to have thoracentesis, but ultimately had it drained several times.  Last visit with pulmonary revealed in September 2023 that recurrent left-sided pleural had improved.  Further review of the chest x-ray showed that fluid was decreased so procedure was needed.  No pulmonary f/u needed. Overall, no significant shortness of breath for allergies. Takes [place].   Denies : Chest pain. Dizziness. Leg edema. Nitroglycerin use. Orthopnea. Palpitations. Paroxysmal nocturnal dyspnea. Shortness of breath. Syncope.    Staying active with walking.  Still gardening.   Balance is decreasing.  She thinks it is the metoprolol.  She wants to decrease this medicine.     Past Medical History:  Diagnosis Date   Arthritis    Asthma    CAD (coronary artery disease)    s/p CABG   Chronic kidney disease, stage 3 ([place])    Dyslipidemia 05/13/2021   Headache  Hypercholesteremia    Hyperlipidemia    Osteopenia    Personal history of colonic polyps    Pleural effusion    recurrent    Past Surgical History:  Procedure Laterality Date   ABDOMINAL HYSTERECTOMY  02/15/2013   prolapse   CARDIAC CATHETERIZATION     COLONOSCOPY WITH PROPOFOL N/A 12/07/2014   Procedure: COLONOSCOPY WITH PROPOFOL;  Surgeon: Ronald Lobo, MD;  Location: [facility];  Service: Endoscopy;  Laterality: N/A;  ultraslim   CORONARY ARTERY BYPASS GRAFT N/A 03/24/2021   Procedure: CORONARY ARTERY BYPASS GRAFTING (CABG) TIMES THREE USING LEFT INTERNAL MAMMARY ARTERY AND RIGHT GREATER SAPHENOUS  VEIN HARVESTED ENDOSCOPICALLY;  Surgeon: Wonda Olds, MD;  Location: [place];  Service: Open Heart Surgery;  Laterality: N/A;   IR THORACENTESIS ASP PLEURAL SPACE W/IMG GUIDE  04/17/2021   LEFT HEART CATH AND CORONARY ANGIOGRAPHY N/A 03/22/2021   Procedure: LEFT HEART CATH AND CORONARY ANGIOGRAPHY;  Surgeon: Troy Sine, MD;  Location: [place] [facility];  Service: Cardiovascular;  Laterality: N/A;   TEE WITHOUT CARDIOVERSION N/A 03/24/2021   Procedure: TRANSESOPHAGEAL ECHOCARDIOGRAM (TEE);  Surgeon: Wonda Olds, MD;  Location: [place];  Service: Open Heart Surgery;  Laterality: N/A;     Current Outpatient Medications  Medication Sig Dispense Refill   acetaminophen (TYLENOL) 500 MG tablet Take 500-1,000 mg by mouth every 6 (six) hours as needed for moderate pain or headache.     albuterol (VENTOLIN HFA) 108 (90 Base) MCG/ACT inhaler Inhale 1 puff into the lungs every 6 (six) hours as needed for shortness of breath.     apixaban (ELIQUIS) 2.5 MG TABS tablet TAKE 1 TABLET(2.5 MG) BY MOUTH TWICE DAILY 60 tablet 5   aspirin 81 MG EC tablet Take 81 mg by mouth daily. Swallow whole.     CALCIUM PO Take 1 tablet by mouth daily.     Cholecalciferol (VITAMIN D PO) Take 1 tablet by mouth every morning.     fluticasone (FLONASE) 50 MCG/ACT nasal spray Place 1 spray into both nostrils daily as needed for allergies.     metoprolol tartrate (LOPRESSOR) 25 MG tablet Take 2 tablets (50 mg) every AM and 3 tablets (75 mg ) every PM 450 tablet 3   Multiple Vitamin (MULTIVITAMIN WITH MINERALS) TABS tablet Take 1 tablet by mouth at bedtime. Centrum silver.     Propylene Glycol (SYSTANE BALANCE OP) Apply 1 drop to eye every 4 (four) hours as needed (dry eyes.).     vitamin C (ASCORBIC ACID) 500 MG tablet Take 500 mg by mouth every morning.     vitamin E 180 MG (400 UNITS) capsule Take 1 capsule (400 Units total) by mouth every morning.     zinc gluconate 50 MG tablet Take 50 mg by mouth daily.     No  current facility-administered medications for this visit.    Allergies:   Amiodarone, Macrobid [nitrofurantoin], Mold extract [trichophyton], Other, Statins, and Zyrtec [cetirizine hcl]    Social History:  The patient  reports that she has never smoked. She has never used smokeless tobacco. She reports that she does not drink alcohol and does not use drugs.   Family History:  The patient's family history includes Atrial fibrillation in her sister; Cancer - Colon in her mother; Diabetes in her brother; Heart disease in her father; Rheum arthritis in her sister.    ROS:  Please see the history of present illness.   Otherwise, review of systems are positive for occasional nosebleeds-  she has been able to stop them.   All other systems are reviewed and negative.    PHYSICAL EXAM: VS:  BP 122/64   Pulse 71   Ht 5\' 2"  (1.575 m)   Wt 139 lb (63 kg)   SpO2 96%   BMI 25.42 kg/m  , BMI Body mass index is 25.42 kg/m. GEN: Well nourished, well developed, in no acute distress HEENT: normal Neck: no JVD, carotid bruits, or masses Cardiac: RRR; no murmurs, rubs, or gallops,no edema  Respiratory:  clear to auscultation bilaterally, normal work of breathing GI: soft, nontender, nondistended, + BS MS: no deformity or atrophy Skin: warm and dry, no rash Neuro:  Strength and sensation are intact Psych: euthymic mood, full affect   EKG:   The ekg ordered March 2023 demonstrates normal sinus rhythm   Recent Labs: 07/05/2021: BUN 20; Creatinine 1.14; Potassium 4.4; Sodium 138 08/14/2021: ALT 30 05/14/2022: Hemoglobin 12.3; Platelet Count 330   Lipid Panel    Component Value Date/Time   CHOL 209 (H) 08/14/2021 0845   TRIG 128 08/14/2021 0845   HDL 51 08/14/2021 0845   CHOLHDL 4.1 08/14/2021 0845   CHOLHDL 4.4 03/22/2021 0149   VLDL 28 03/22/2021 0149   LDLCALC 135 (H) 08/14/2021 0845     Other studies Reviewed: Additional studies/ records that were reviewed today with results  demonstrating: labs reviewed, LDL 163 in May 2023.  Hemoglobin 12.3 in August 2023, renal function normal at last check.   ASSESSMENT AND PLAN:  CAD: Continue low-dose Eliquis.  Decrease frequency of aspirin to 3x/week to see if nosebleeds decrease.  No angina on medical therapy.  Afib: No palpitations. Did not tolerate Amio.  She has been on metoprolol to help maintain NSR.  She is concerned that her decreased balance.  Decrease metoprolol to 50 mg twice a day to see if balance problems improve. Anticoagulated/acquired thrombophilia: Has had some nose bleeds.  Decreasing aspirin.  Hyperlipidemia: She has declined PCSK9 inhibitor.  She has been intolerant of statins.  Not interested in other therapies.  Pleural effusion:This issue has resolved.    Current medicines are reviewed at length with the patient today.  The patient concerns regarding her medicines were addressed.  The following changes have been made: As above  Labs/ tests ordered today include:  No orders of the defined types were placed in this encounter.   Recommend 150 minutes/week of aerobic exercise Low fat, low carb, high fiber diet recommended  Disposition:   FU in 6 months   Signed, Larae Grooms, MD  06/13/2022 9:13 AM    [facility] [place], [place], [place]  [address] Phone: [phone]; Fax: [phone]

## 2022-06-13 NOTE — Patient Instructions (Signed)
Medication Instructions:  Your physician has recommended you make the following change in your medication:  Decrease metoprolol tartrate to 50 mg by mouth twice daily. Decrease aspirin to 3 times per week on Monday, Wednesday and Friday  *If you need a refill on your cardiac medications before your next appointment, please call your pharmacy*   Lab Work: none If you have labs (blood work) drawn today and your tests are completely normal, you will receive your results only by: [place] (if you have MyChart) OR A paper copy in the mail If you have any lab test that is abnormal or we need to change your treatment, we will call you to review the results.   Testing/Procedures: none   Follow-Up: At [facility], you and your health needs are our priority.  As part of our continuing mission to provide you with exceptional heart care, we have created designated Provider Care Teams.  These Care Teams include your primary Cardiologist (physician) and Advanced Practice Providers (APPs -  Physician Assistants and Nurse Practitioners) who all work together to provide you with the care you need, when you need it.  We recommend signing up for the patient portal called "MyChart".  Sign up information is provided on this After Visit Summary.  MyChart is used to connect with patients for Virtual Visits (Telemedicine).  Patients are able to view lab/test results, encounter notes, upcoming appointments, etc.  Non-urgent messages can be sent to your provider as well.   To learn more about what you can do with MyChart, go to [email or web address].    Your next appointment:   6 month(s)  The format for your next appointment:   In Person  Provider:   Larae Grooms, MD  or APP    Other Instructions    Important Information About Sugar

## 2022-07-12 DIAGNOSIS — H903 Sensorineural hearing loss, bilateral: Secondary | ICD-10-CM | POA: Diagnosis not present

## 2022-07-17 DIAGNOSIS — H353221 Exudative age-related macular degeneration, left eye, with active choroidal neovascularization: Secondary | ICD-10-CM | POA: Diagnosis not present

## 2022-07-17 DIAGNOSIS — Z961 Presence of intraocular lens: Secondary | ICD-10-CM | POA: Diagnosis not present

## 2022-08-14 DIAGNOSIS — Z961 Presence of intraocular lens: Secondary | ICD-10-CM | POA: Diagnosis not present

## 2022-08-14 DIAGNOSIS — H353221 Exudative age-related macular degeneration, left eye, with active choroidal neovascularization: Secondary | ICD-10-CM | POA: Diagnosis not present

## 2022-08-20 ENCOUNTER — Telehealth: Payer: Self-pay | Admitting: Interventional Cardiology

## 2022-08-20 NOTE — Telephone Encounter (Signed)
I spoke with patient.  She is calling to report recent BP readings.  She checks BP daily and noticed it was running higher than normal around November 11.  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  She did see eye doctor recently and received injection and vision is better.   Metoprolol was decreased at 9/27 office visit due to balance issues.  Patient reports balance issues have improved and she is not having any dizziness. In addition to readings below she reports the following readings-- 11/1-101/62 11/13-118/85. Most readings around this time have been 73-79 diastolic with a few in the 80's mixed in.  Heart rate 62-64.  Will forward readings to Dr Eldridge Dace for review/recommendations.

## 2022-08-20 NOTE — Telephone Encounter (Signed)
Continue to monitor blood pressure.  Readings from November 1 and November 13 are well-controlled.  Would not change medications at this time.  If they start to go above the 130-140 range, could consider adding more medication.

## 2022-08-20 NOTE — Telephone Encounter (Signed)
I spoke with patient and gave her message from Dr Varanasi 

## 2022-08-20 NOTE — Telephone Encounter (Signed)
Pt c/o BP issue: STAT if pt c/o blurred vision, one-sided weakness or slurred speech  1. What are your last 5 BP readings?  12/04: 114/79 12/03: 124/79 12/02: 120/83 12/01: 122/88   2. Are you having any other symptoms (ex. Dizziness, headache, blurred vision, passed out)?  No, patient states she feels fine  3. What is your BP issue?  Patient states her BP has been elevated

## 2022-08-20 NOTE — Telephone Encounter (Signed)
Left message to call office

## 2022-09-18 DIAGNOSIS — H353221 Exudative age-related macular degeneration, left eye, with active choroidal neovascularization: Secondary | ICD-10-CM | POA: Diagnosis not present

## 2022-09-18 DIAGNOSIS — Z961 Presence of intraocular lens: Secondary | ICD-10-CM | POA: Diagnosis not present

## 2022-10-16 DIAGNOSIS — H35363 Drusen (degenerative) of macula, bilateral: Secondary | ICD-10-CM | POA: Diagnosis not present

## 2022-10-16 DIAGNOSIS — H353221 Exudative age-related macular degeneration, left eye, with active choroidal neovascularization: Secondary | ICD-10-CM | POA: Diagnosis not present

## 2022-10-16 DIAGNOSIS — H3581 Retinal edema: Secondary | ICD-10-CM | POA: Diagnosis not present

## 2022-10-23 IMAGING — DX DG CHEST 2V
2 series · 2 of 2 positions shown · non-contrast
Comparison: 03/27/2021

CLINICAL DATA: Pneumothorax

EXAM:
CHEST - 2 VIEW

[chest pa]
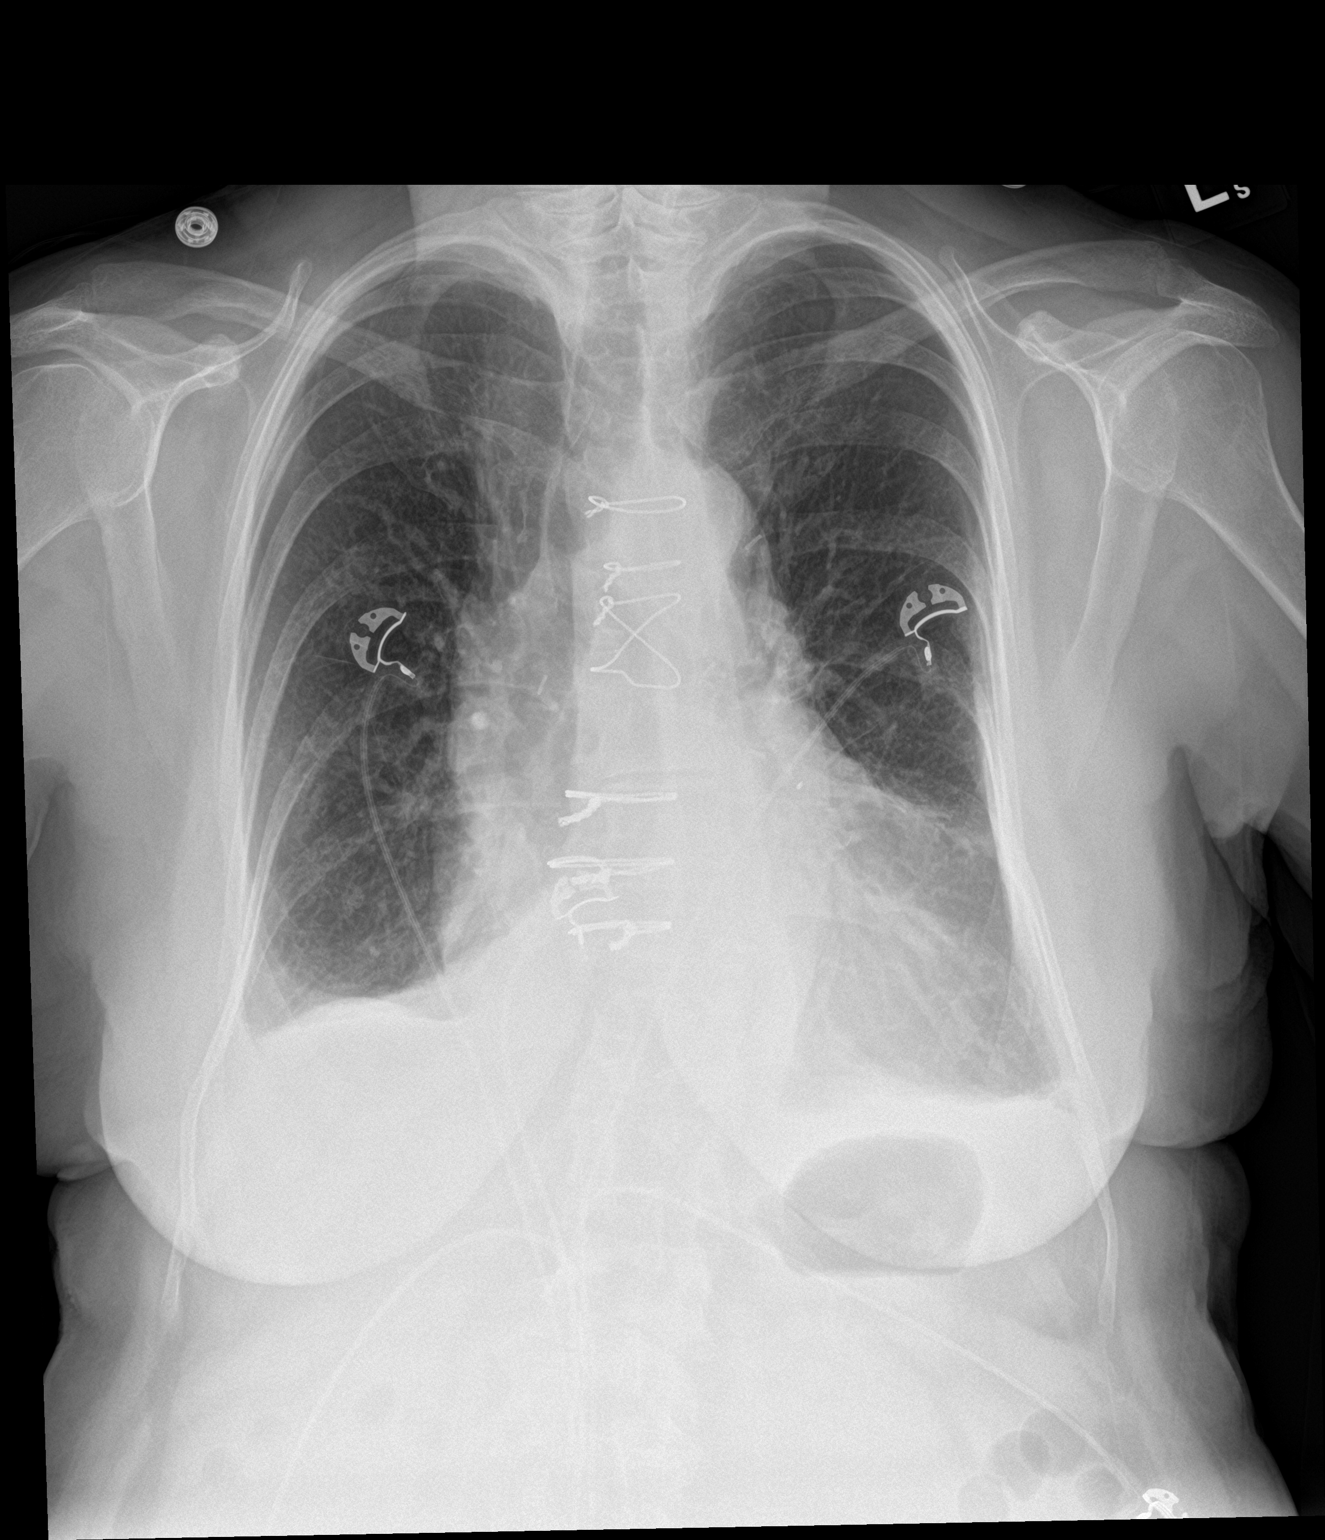

[chest lat]
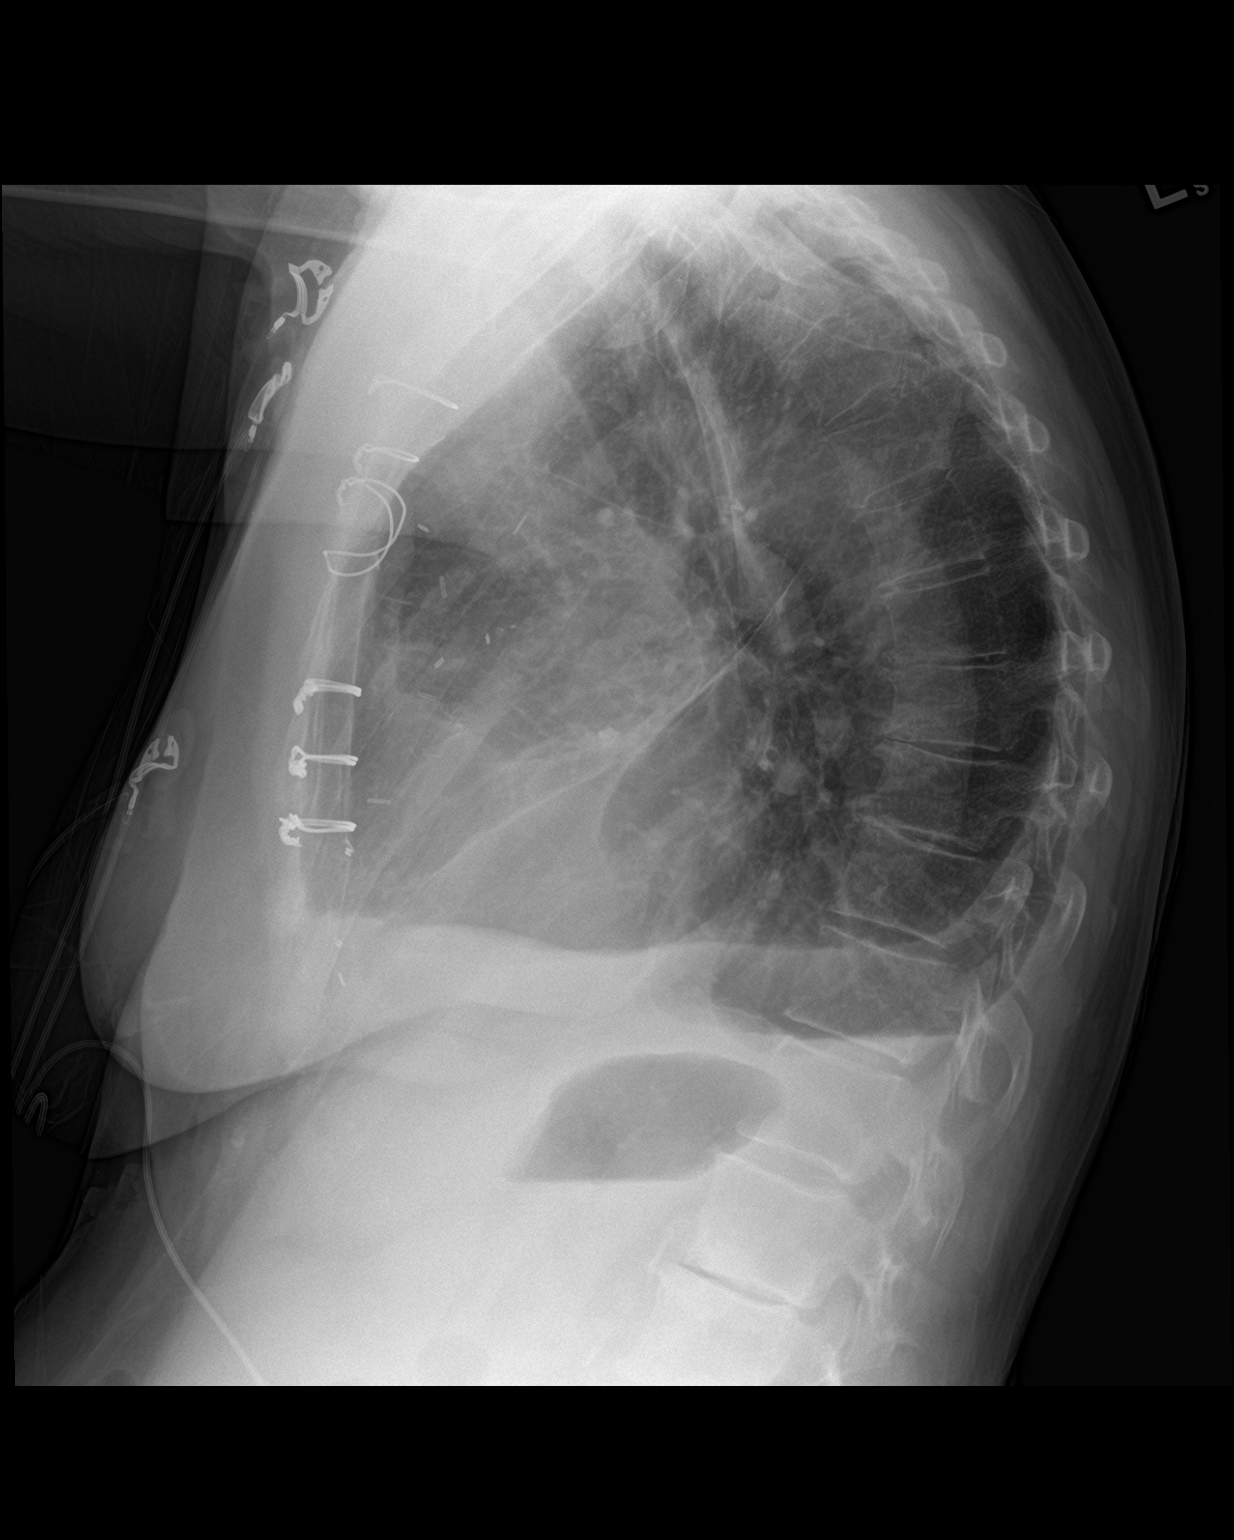

[2 of 2 positions shown; findings below may reference images not displayed]

FINDINGS: No definite residual pneumothorax. Small bilateral pleural effusions
with adjacent atelectasis. Stable cardiomediastinal contours.
IMPRESSION: No definite residual pneumothorax. Small bilateral pleural effusions
with adjacent atelectasis.

## 2022-11-12 ENCOUNTER — Other Ambulatory Visit: Payer: Self-pay | Admitting: Family Medicine

## 2022-11-12 DIAGNOSIS — Z1231 Encounter for screening mammogram for malignant neoplasm of breast: Secondary | ICD-10-CM

## 2022-11-13 DIAGNOSIS — Z961 Presence of intraocular lens: Secondary | ICD-10-CM | POA: Diagnosis not present

## 2022-11-13 DIAGNOSIS — H353221 Exudative age-related macular degeneration, left eye, with active choroidal neovascularization: Secondary | ICD-10-CM | POA: Diagnosis not present

## 2022-11-26 ENCOUNTER — Other Ambulatory Visit: Payer: Self-pay | Admitting: *Deleted

## 2022-11-26 DIAGNOSIS — I4891 Unspecified atrial fibrillation: Secondary | ICD-10-CM

## 2022-11-26 MED ORDER — APIXABAN 2.5 MG PO TABS: ORAL_TABLET | ORAL | 0 refills | Status: DC

## 2022-11-26 NOTE — Telephone Encounter (Signed)
Prescription refill request for Eliquis received.  Last office visit: Irish Lack, 06/13/2022 Scr: 0.9, 01/31/2022 Age: 82 yo  Weight: 63 kg     Per Dr. Irish Lack note from 06/13/2022 CAD: Continue low-dose Eliquis.  Decrease frequency of aspirin to 3x/week to see if nosebleeds decrease.  No angina on medical therapy.

## 2022-12-07 NOTE — Progress Notes (Signed)
Cardiology Office Note:    Date:  12/19/2022   ID:  Kimberly Calhoun, DOB 06-Jul-1941, MRN [id number]  PCP:  Lurline Del, [place] [facility] Cardiologist:  Larae Grooms, MD     Referring MD: Lurline Del, DO   Chief Complaint:  No chief complaint on file.     History of Present Illness:   Kimberly Calhoun is a 82 y.o. 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   She continues to have pleural effusions and is followed by pulmonary.  Patient last saw Dr. Irish Lack 11/2021 and has more fluid in her lung.   Patient comes in for f/u. She hasn't had DOE or thoracentesis in a long time. Just got back from a [place] and over indulged. Cholesterol is elevated. Denies chest pain. Walks 2 miles daily and gardens. No palpitations, edema. Brings labs from PCP and LDL 175 TC 273, trig 204,       Past Medical History:  Diagnosis Date   Arthritis    Asthma    CAD (coronary artery disease)    s/p CABG   Chronic kidney disease, stage 3    Dyslipidemia 05/13/2021   Headache    Hypercholesteremia    Hyperlipidemia    Osteopenia    Personal history of colonic polyps    Pleural effusion    recurrent   Current Medications: Current Meds  Medication Sig   acetaminophen (TYLENOL) 500 MG tablet Take 500-1,000 mg by mouth every 6 (six) hours as needed for moderate pain or headache.   albuterol (VENTOLIN HFA) 108 (90 Base) MCG/ACT inhaler Inhale 1 puff into the lungs every 6 (six) hours as needed for shortness of breath.   apixaban (ELIQUIS) 2.5 MG TABS tablet TAKE 1 TABLET(2.5 MG) BY MOUTH TWICE DAILY   aspirin EC 81 MG tablet Take one tablet by mouth on Monday, Wednesday and Friday. Swallow whole.   CALCIUM PO Take 1 tablet by mouth daily.   Cholecalciferol (VITAMIN D PO) Take 1 tablet by mouth every morning.   Cyanocobalamin (VITAMIN B-12) 5000 MCG SUBL  Place 1 tablet under the tongue daily.   fluticasone (FLONASE) 50 MCG/ACT nasal spray Place 1 spray into both nostrils daily as needed for allergies.   loratadine (CLARITIN) 10 MG tablet Take 10 mg by mouth daily.   metoprolol tartrate (LOPRESSOR) 50 MG tablet Take 1 tablet (50 mg total) by mouth 2 (two) times daily.   Multiple Vitamin (MULTIVITAMIN WITH MINERALS) TABS tablet Take 1 tablet by mouth at bedtime. Centrum silver.   Propylene Glycol (SYSTANE BALANCE OP) Apply 1 drop to eye every 4 (four) hours as needed (dry eyes.).   vitamin C (ASCORBIC ACID) 500 MG tablet Take 500 mg by mouth every morning.   vitamin E 180 MG (400 UNITS) capsule Take 1 capsule (400 Units total) by mouth every morning.   zinc gluconate 50 MG tablet Take 50 mg by mouth daily.    Allergies:   Amiodarone, Macrobid [nitrofurantoin], Mold extract [trichophyton], Other, Statins, and Zyrtec [cetirizine hcl]   Social History   Tobacco Use   Smoking status: Never   Smokeless tobacco: Never  Vaping Use   Vaping Use: Never used  Substance Use Topics   Alcohol use: No   Drug use: No    Family Hx: The patient's family history includes Atrial fibrillation in her sister; Cancer - Colon  in her mother; Diabetes in her brother; Heart disease in her father; Rheum arthritis in her sister. There is no history of Breast cancer.  ROS     Physical Exam:    VS:  BP 130/68   Pulse 63   Ht 5\' 2"  (1.575 m)   Wt 141 lb (64 kg)   SpO2 96%   BMI 25.79 kg/m     Wt Readings from Last 3 Encounters:  12/19/22 141 lb (64 kg)  06/13/22 139 lb (63 kg)  06/11/22 138 lb 12.8 oz (63 kg)    Physical Exam  GEN: Well nourished, well developed, in no acute distress  Neck: no JVD, carotid bruits, or masses Cardiac:RRR; no murmurs, rubs, or gallops  Respiratory:  clear to auscultation bilaterally, normal work of breathing GI: soft, nontender, nondistended, + BS Ext: retained stitch from SVG graft site removed with pus coming out.  Still some stitch present, without cyanosis, clubbing, or edema, Good distal pulses bilaterally Neuro:  Alert and Oriented x 3,  Psych: euthymic mood, full affect        EKGs/Labs/Other Test Reviewed:    EKG:  EKG is   ordered today.  The ekg ordered today demonstrates NSR with LVH unchanged  Recent Labs: 05/14/2022: Hemoglobin 12.3; Platelet Count 330   Recent Lipid Panel No results for input(s): "CHOL", "TRIG", "HDL", "VLDL", "LDLCALC", "LDLDIRECT" in the last 8760 hours.   Prior CV Studies:   Echo in 04/2021 showed: "Echo results as noted:  1. Left ventricular ejection fraction, by estimation, is 65 to 70%. The  left ventricle has normal function. The left ventricle has no regional  wall motion abnormalities. Left ventricular diastolic parameters are  consistent with Grade I diastolic  dysfunction (impaired relaxation). Elevated left ventricular end-diastolic  pressure.   2. Right ventricular systolic function is mildly reduced. The right  ventricular size is normal. There is normal pulmonary artery systolic  pressure. The estimated right ventricular systolic pressure is [id number] mmHg.   3. Large pleural effusion in the left lateral region.   4. The mitral valve is normal in structure. No evidence of mitral valve  regurgitation. No evidence of mitral stenosis.   5. The aortic valve is tricuspid. Aortic valve regurgitation is not  visualized. Mild aortic valve sclerosis is present, with no evidence of  aortic valve stenosis.   6. The inferior vena cava is normal in size with greater than 50%  respiratory variability, suggesting right atrial pressure of 3 mmHg. "   Risk Assessment/Calculations/Metrics:              ASSESSMENT & PLAN:   No problem-specific Assessment & Plan notes found for this encounter.   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 No recurrence  Recurrent pleural effusions followed by pulmonary-has been stable.   HLD intolerant to  statins and declined PCSK9I in past but LDL 175. Will refer to lipid clinic   HTN-BP's stable at home but she is worried they are too low. She'll check them 2 hrs after she takes her meds and let us know.   Retained suture from right leg SVG site-partially removed with pus coming out. Still some suture there. She will contact cardiac surgeons if it doesn't heal on it's own.             Dispo:  No follow-ups on file.   Medication Adjustments/Labs and Tests Ordered: Current medicines are reviewed at length with the patient today.  Concerns regarding medicines are outlined above.  Tests Ordered: Orders Placed This Encounter  Procedures   AMB Referral to [facility]   EKG 12-Lead   Medication Changes: No orders of the defined types were placed in this encounter.  Sumner Boast, PA-C  12/19/2022 10:02 AM    [address], [place], [place]  [address] Phone: [phone]; Fax: [phone]

## 2022-12-18 DIAGNOSIS — Z951 Presence of aortocoronary bypass graft: Secondary | ICD-10-CM | POA: Diagnosis not present

## 2022-12-18 DIAGNOSIS — I480 Paroxysmal atrial fibrillation: Secondary | ICD-10-CM | POA: Diagnosis not present

## 2022-12-18 DIAGNOSIS — Z6824 Body mass index (BMI) 24.0-24.9, adult: Secondary | ICD-10-CM | POA: Diagnosis not present

## 2022-12-18 DIAGNOSIS — N183 Chronic kidney disease, stage 3 unspecified: Secondary | ICD-10-CM | POA: Diagnosis not present

## 2022-12-18 DIAGNOSIS — J90 Pleural effusion, not elsewhere classified: Secondary | ICD-10-CM | POA: Diagnosis not present

## 2022-12-18 DIAGNOSIS — I252 Old myocardial infarction: Secondary | ICD-10-CM | POA: Diagnosis not present

## 2022-12-18 DIAGNOSIS — D6869 Other thrombophilia: Secondary | ICD-10-CM | POA: Diagnosis not present

## 2022-12-19 ENCOUNTER — Encounter: Payer: Self-pay | Admitting: Physician Assistant

## 2022-12-19 ENCOUNTER — Ambulatory Visit: Payer: Medicare Other | Attending: Physician Assistant | Admitting: Physician Assistant

## 2022-12-19 VITALS — BP 130/68 | HR 63 | Ht 62.0 in | Wt 141.0 lb

## 2022-12-19 DIAGNOSIS — I10 Essential (primary) hypertension: Secondary | ICD-10-CM

## 2022-12-19 DIAGNOSIS — Z789 Other specified health status: Secondary | ICD-10-CM | POA: Diagnosis not present

## 2022-12-19 DIAGNOSIS — E782 Mixed hyperlipidemia: Secondary | ICD-10-CM | POA: Diagnosis not present

## 2022-12-19 DIAGNOSIS — I25708 Atherosclerosis of coronary artery bypass graft(s), unspecified, with other forms of angina pectoris: Secondary | ICD-10-CM | POA: Diagnosis not present

## 2022-12-19 DIAGNOSIS — T8131XA Disruption of external operation (surgical) wound, not elsewhere classified, initial encounter: Secondary | ICD-10-CM | POA: Diagnosis not present

## 2022-12-19 NOTE — Patient Instructions (Signed)
Medication Instructions:  Your physician recommends that you continue on your current medications as directed. Please refer to the Current Medication list given to you today.  *If you need a refill on your cardiac medications before your next appointment, please call your pharmacy*   Lab Work: None ordered   If you have labs (blood work) drawn today and your tests are completely normal, you will receive your results only by: [place] (if you have MyChart) OR A paper copy in the mail If you have any lab test that is abnormal or we need to change your treatment, we will call you to review the results.   Testing/Procedures: Your physician has referred you to the lipid clinic for statin intolerance    Follow-Up: At [facility], you and your health needs are our priority.  As part of our continuing mission to provide you with exceptional heart care, we have created designated Provider Care Teams.  These Care Teams include your primary Cardiologist (physician) and Advanced Practice Providers (APPs -  Physician Assistants and Nurse Practitioners) who all work together to provide you with the care you need, when you need it.  We recommend signing up for the patient portal called "MyChart".  Sign up information is provided on this After Visit Summary.  MyChart is used to connect with patients for Virtual Visits (Telemedicine).  Patients are able to view lab/test results, encounter notes, upcoming appointments, etc.  Non-urgent messages can be sent to your provider as well.   To learn more about what you can do with MyChart, go to [email or web address].    Your next appointment:   12 month(s)  Provider:   Larae Grooms, MD     Other Instructions Check your blood pressure 2 hours after you take your blood pressure medicine. Keep a log of your readings for the next 2 weeks and call us or send [place] a Mychart message with your readings.

## 2022-12-26 ENCOUNTER — Ambulatory Visit
Admission: RE | Admit: 2022-12-26 | Discharge: 2022-12-26 | Disposition: A | Discharge status: Home / Self Care | Payer: Medicare Other | Source: Ambulatory Visit | Attending: Family Medicine | Admitting: Family Medicine

## 2022-12-26 DIAGNOSIS — Z1231 Encounter for screening mammogram for malignant neoplasm of breast: Secondary | ICD-10-CM | POA: Diagnosis not present

## 2022-12-28 ENCOUNTER — Other Ambulatory Visit: Payer: Self-pay | Admitting: Family Medicine

## 2022-12-28 DIAGNOSIS — R928 Other abnormal and inconclusive findings on diagnostic imaging of breast: Secondary | ICD-10-CM

## 2023-01-08 ENCOUNTER — Ambulatory Visit: Payer: Medicare Other | Attending: Interventional Cardiology | Admitting: Pharmacist

## 2023-01-08 DIAGNOSIS — E785 Hyperlipidemia, unspecified: Secondary | ICD-10-CM

## 2023-01-08 DIAGNOSIS — H353221 Exudative age-related macular degeneration, left eye, with active choroidal neovascularization: Secondary | ICD-10-CM | POA: Diagnosis not present

## 2023-01-08 DIAGNOSIS — Z961 Presence of intraocular lens: Secondary | ICD-10-CM | POA: Diagnosis not present

## 2023-01-08 NOTE — Progress Notes (Signed)
Patient ID: Kimberly Calhoun                 DOB: 1941/02/11                    MRN: [id number]      HPI: Kimberly Calhoun is a 82 y.o. female patient pf Dr. Hoyle Barr referred to lipid clinic by  Dayton Scrape. 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  Patient previously intolerant to statins.  Was offered PCSK9 and past and refused.  Patient referred to lipid clinic.  Last LDL-C was 175 and non-HDL cholesterol 213.   Patient presents to lipid clinic today.  She states that she is not interested in starting any new medications at this time.  She has 2 issues that she wants to get resolved first.  First issue is with her macular degeneration.  She is having issues with her eyes and she also does not like her ophthalmologist.  Asks about 1 in the [facility] system.  Advised her to go to [email or web address] and search ophthalmologist.  The other issue she is having is with allergies from around January till April or May.  States her other doctors do not want to listen to her and just say it seasonal allergies.  She did see an allergist who did a small skin test and told her she was not allergic to anything.  Patient states many years ago she had a much more extensive test that found that she was allergic to several things and she underwent allergy shots which helped until about a year ago.   Current Medications: None Intolerances: rosuvastatin, ezetimibe, simvastatin (she does not remember reaction but knows it was bad) Risk Factors: CABG, age LDL-C goal: <70 ApoB goal: <80  Diet: doesn't do much cooking but does make sure she eats vegetables and fruits, nuts, fish  Exercise: 1.5 acre of yard she takes care of, walks 2 miles, walks up and down stairs  Family History:  Family History  Problem Relation Age of Onset   Cancer - Colon Mother    Heart disease Father    Atrial fibrillation Sister    Rheum  arthritis Sister    Diabetes Brother    Breast cancer Neg Hx     Social History:  Social History   Socioeconomic History   Marital status: Widowed    Spouse name: Not on file   Number of children: 1   Years of education: 68   Highest education level: Not on file  Occupational History   Occupation: retired  Tobacco Use   Smoking status: Never   Smokeless tobacco: Never  Vaping Use   Vaping Use: Never used  Substance and Sexual Activity   Alcohol use: No   Drug use: No   Sexual activity: Not Currently  Other Topics Concern   Not on file  Social History Narrative   Not on file   Social Determinants of Health   Financial Resource Strain: Not on file  Food Insecurity: Not on file  Transportation Needs: Not on file  Physical Activity: Not on file  Stress: Not on file  Social Connections: Not on file  Intimate Partner Violence: Not on file     Labs: Lipid Panel     Component Value Date/Time   CHOL 209 (H) 08/14/2021 0845   TRIG 128 08/14/2021 0845   HDL 51  08/14/2021 0845   CHOLHDL 4.1 08/14/2021 0845   CHOLHDL 4.4 03/22/2021 0149   VLDL 28 03/22/2021 0149   LDLCALC 135 (H) 08/14/2021 0845   LABVLDL 23 08/14/2021 0845    Past Medical History:  Diagnosis Date   Arthritis    Asthma    CAD (coronary artery disease)    s/p CABG   Chronic kidney disease, stage 3    Dyslipidemia 05/13/2021   Headache    Hypercholesteremia    Hyperlipidemia    Osteopenia    Personal history of colonic polyps    Pleural effusion    recurrent    Current Outpatient Medications on File Prior to Visit  Medication Sig Dispense Refill   acetaminophen (TYLENOL) 500 MG tablet Take 500-1,000 mg by mouth every 6 (six) hours as needed for moderate pain or headache.     albuterol (VENTOLIN HFA) 108 (90 Base) MCG/ACT inhaler Inhale 1 puff into the lungs every 6 (six) hours as needed for shortness of breath.     apixaban (ELIQUIS) 2.5 MG TABS tablet TAKE 1 TABLET(2.5 MG) BY MOUTH TWICE  DAILY 180 tablet 0   aspirin EC 81 MG tablet Take one tablet by mouth on Monday, Wednesday and Friday. Swallow whole. 90 tablet 3   CALCIUM PO Take 1 tablet by mouth daily.     Cholecalciferol (VITAMIN D PO) Take 1 tablet by mouth every morning.     Cyanocobalamin (VITAMIN B-12) 5000 MCG SUBL Place 1 tablet under the tongue daily.     fluticasone (FLONASE) 50 MCG/ACT nasal spray Place 1 spray into both nostrils daily as needed for allergies.     loratadine (CLARITIN) 10 MG tablet Take 10 mg by mouth daily.     metoprolol tartrate (LOPRESSOR) 50 MG tablet Take 1 tablet (50 mg total) by mouth 2 (two) times daily. 180 tablet 3   Multiple Vitamin (MULTIVITAMIN WITH MINERALS) TABS tablet Take 1 tablet by mouth at bedtime. Centrum silver.     Propylene Glycol (SYSTANE BALANCE OP) Apply 1 drop to eye every 4 (four) hours as needed (dry eyes.).     vitamin C (ASCORBIC ACID) 500 MG tablet Take 500 mg by mouth every morning.     vitamin E 180 MG (400 UNITS) capsule Take 1 capsule (400 Units total) by mouth every morning.     zinc gluconate 50 MG tablet Take 50 mg by mouth daily.     No current facility-administered medications on file prior to visit.    Allergies  Allergen Reactions   Amiodarone Nausea And Vomiting   Macrobid [Nitrofurantoin] Other (See Comments)    Cold chills, muscle weakness.   Mold Extract [Trichophyton] Other (See Comments)    Allergies.    Other Other (See Comments)    Allergies.   Mold in Cheese - stuffy nose and headache  Chocolate- stuffy nose and headache  Mildew - stuffy nose and headache   Statins Other (See Comments)    Muscle pain   Zyrtec [Cetirizine Hcl] Other (See Comments)    PACs    Assessment/Plan:  1. Hyperlipidemia -  Dyslipidemia Assessment: LDL-C is significantly elevated as is non-HDL cholesterol. Patient is not interested in medication treatment at this time She is very active and eats a diet high in fish and vegetables We did discuss the  risks and benefits of treatment and the time to benefit  Plan: Continue to be physically active consider adding in resistance training Patient instructed to call me when she is ready  to talk about medication therapy for cholesterol  Allergies I recommended patient try a saline rinse before use of Flonase and as needed.  Recommended she switch her antihistamine to something like Xyzal or Allegra.    Thank you,  Olene Floss, Pharm.D, BCPS, CPP  [facility] [address] [place], [place] [address]  Phone: [phone]; Fax: [phone]

## 2023-01-08 NOTE — Assessment & Plan Note (Signed)
Assessment: LDL-C is significantly elevated as is non-HDL cholesterol. Patient is not interested in medication treatment at this time She is very active and eats a diet high in fish and vegetables We did discuss the risks and benefits of treatment and the time to benefit  Plan: Continue to be physically active consider adding in resistance training Patient instructed to call me when she is ready to talk about medication therapy for cholesterol

## 2023-01-10 ENCOUNTER — Ambulatory Visit
Admission: RE | Admit: 2023-01-10 | Discharge: 2023-01-10 | Disposition: A | Discharge status: Home / Self Care | Payer: Medicare Other | Source: Ambulatory Visit | Attending: Family Medicine | Admitting: Family Medicine

## 2023-01-10 ENCOUNTER — Other Ambulatory Visit: Payer: Self-pay | Admitting: Family Medicine

## 2023-01-10 DIAGNOSIS — R928 Other abnormal and inconclusive findings on diagnostic imaging of breast: Secondary | ICD-10-CM

## 2023-01-10 DIAGNOSIS — R921 Mammographic calcification found on diagnostic imaging of breast: Secondary | ICD-10-CM | POA: Diagnosis not present

## 2023-01-10 DIAGNOSIS — N6320 Unspecified lump in the left breast, unspecified quadrant: Secondary | ICD-10-CM

## 2023-01-10 DIAGNOSIS — N6321 Unspecified lump in the left breast, upper outer quadrant: Secondary | ICD-10-CM | POA: Diagnosis not present

## 2023-01-15 DIAGNOSIS — H353221 Exudative age-related macular degeneration, left eye, with active choroidal neovascularization: Secondary | ICD-10-CM | POA: Diagnosis not present

## 2023-01-15 DIAGNOSIS — H3581 Retinal edema: Secondary | ICD-10-CM | POA: Diagnosis not present

## 2023-01-21 ENCOUNTER — Other Ambulatory Visit: Payer: Medicare Other

## 2023-01-23 ENCOUNTER — Other Ambulatory Visit: Payer: Medicare Other

## 2023-01-24 ENCOUNTER — Other Ambulatory Visit: Payer: Medicare Other

## 2023-02-13 ENCOUNTER — Ambulatory Visit
Admission: RE | Admit: 2023-02-13 | Discharge: 2023-02-13 | Disposition: A | Discharge status: Home / Self Care | Payer: Medicare Other | Source: Ambulatory Visit | Attending: Family Medicine | Admitting: Family Medicine

## 2023-02-13 DIAGNOSIS — N6012 Diffuse cystic mastopathy of left breast: Secondary | ICD-10-CM | POA: Diagnosis not present

## 2023-02-13 DIAGNOSIS — R928 Other abnormal and inconclusive findings on diagnostic imaging of breast: Secondary | ICD-10-CM

## 2023-02-13 DIAGNOSIS — N6321 Unspecified lump in the left breast, upper outer quadrant: Secondary | ICD-10-CM | POA: Diagnosis not present

## 2023-02-13 DIAGNOSIS — N6320 Unspecified lump in the left breast, unspecified quadrant: Secondary | ICD-10-CM

## 2023-02-13 HISTORY — PX: BREAST BIOPSY: SHX20

## 2023-02-15 ENCOUNTER — Other Ambulatory Visit: Payer: Self-pay | Admitting: Interventional Cardiology

## 2023-02-15 DIAGNOSIS — I4891 Unspecified atrial fibrillation: Secondary | ICD-10-CM

## 2023-02-15 NOTE — Telephone Encounter (Signed)
Prescription refill request for Eliquis received. Indication: Afib  Last office visit: 12/19/22 Geni Bers) Scr: 1.06 (42/24)  Age: 82 Weight: 64kg   Per Dr. Eldridge Dace note from 06/13/2022 CAD: Continue low-dose Eliquis. Decrease frequency of aspirin to 3x/week to see if nosebleeds decrease.  No angina on medical therapy.   Refill sent.

## 2023-02-20 ENCOUNTER — Other Ambulatory Visit: Payer: Self-pay | Admitting: Family Medicine

## 2023-02-20 DIAGNOSIS — R921 Mammographic calcification found on diagnostic imaging of breast: Secondary | ICD-10-CM

## 2023-02-26 DIAGNOSIS — H3581 Retinal edema: Secondary | ICD-10-CM | POA: Diagnosis not present

## 2023-02-26 DIAGNOSIS — H353221 Exudative age-related macular degeneration, left eye, with active choroidal neovascularization: Secondary | ICD-10-CM | POA: Diagnosis not present

## 2023-03-04 ENCOUNTER — Other Ambulatory Visit: Payer: Self-pay | Admitting: Interventional Cardiology

## 2023-03-04 DIAGNOSIS — I4891 Unspecified atrial fibrillation: Secondary | ICD-10-CM

## 2023-03-04 NOTE — Telephone Encounter (Signed)
Prescription refill request for Eliquis received. Indication:afib Last office visit:4/24 Scr:1.06  4/24 Age: 83 Weight:64  kg  Prescription refilled

## 2023-04-23 DIAGNOSIS — H353221 Exudative age-related macular degeneration, left eye, with active choroidal neovascularization: Secondary | ICD-10-CM | POA: Diagnosis not present

## 2023-04-24 DIAGNOSIS — Z0000 Encounter for general adult medical examination without abnormal findings: Secondary | ICD-10-CM | POA: Diagnosis not present

## 2023-05-15 DIAGNOSIS — R944 Abnormal results of kidney function studies: Secondary | ICD-10-CM | POA: Diagnosis not present

## 2023-05-15 DIAGNOSIS — M810 Age-related osteoporosis without current pathological fracture: Secondary | ICD-10-CM | POA: Diagnosis not present

## 2023-05-15 DIAGNOSIS — N1831 Chronic kidney disease, stage 3a: Secondary | ICD-10-CM | POA: Diagnosis not present

## 2023-05-15 DIAGNOSIS — I480 Paroxysmal atrial fibrillation: Secondary | ICD-10-CM | POA: Diagnosis not present

## 2023-05-15 NOTE — Progress Notes (Unsigned)
Cardiology Office Note:  .   Date:  05/16/2023  ID:  Kimberly Calhoun, DOB [birth year], MRN [id number] PCP: Jackelyn Poling, DO  [facility] Providers Cardiologist:  Lance Muss, MD    Patient Profile: .      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 Pre-CABG echo 03/2021: asymmetric LVH with LVOT 43 mmHg with Valsalva Follow-up echo 05/14/2021 EF 65-70, no RWMA, G1 DD, mildly reduced RVSF, RVSP 27.4, large left pleural effusion, mild AV sclerosis without stenosis Persistent atrial fibrillation CKD Hypertension Hyperlipidemia Reported intolerances with rosuvastatin, ezetimibe, simvastatin - does not recall specific reactions Asthma Macular degeneration  Referred to heart care and seen by Dr. Eldridge Dace 01/17/2021 for evaluation of chest pain. He had taken care of her husband who passed away 08/29/2019.  She reported chest wall muscle spasms in March 2022 while having severe allergy symptoms.  She underwent treatment from a chiropractor.  Also had DOE.  Worse when she had a new cat and improved when she gave the cat away.  Active with working in her yard and taking the steps multiple times per day.  Stress testing and CTA were discussed but since her symptoms had improved she wanted to hold off on testing.  She had unstable angina July 2022 which led to cath showing multivessel disease and underwent CABG x 3 on 03/24/2021. Recovery complicated by postop A-fib and pleural effusion requiring thoracentesis x 2.  Seen by Dr. Eldridge Dace 06/13/22, felt that balance was off thought related to metoprolol.  Metoprolol was decreased to 50 mg twice daily to see if balance improves.  Advised  to reduce aspirin to 3 times per week to see if nosebleeds decrease, continue low-dose Eliquis.  Seen by Jacolyn Reedy, PA on 12/19/22 at which time she was concerned that home BP was too low. Was advised to monitor and report. No recurrence of a fib to her awareness. A retained suture from rt leg SVG site was removed.  Was advised to contact cardiac surgeons if it does not heal on its own.  She was referred to lipid clinic and seen by Malena Peer, RPH on 01/08/23.  At that visit LDL was 175 and non-HDL was 213.  She was not interested in starting medication.  Reported she was very active and eats a diet high in fish and vegetables.  Risks of untreated elevated LDL reviewed with her and she was encouraged to call back when she was ready to start medication.       History of Present Illness: .   Kimberly Calhoun is a very pleasant 82 y.o. female who is here today for follow-up of a fib episode. Reports 3 days ago, she felt chest pressure and fluttering when she sat down to rest after being very active around her home. Was up and down stairs that morning with workmen who were in her house and admits she was dehydrated. Knows she was dehydrated because it makes her feel "fuzzy headed." It took about 1 1/2 hours for symptoms to resolve and when she woke up the next morning she felt well. She does not know what her pulse or BP were during those hours but she felt that her HR was very fast.  She did not take any additional beta-blocker.  No additional episodes of chest pressure since that time.  Prior to CABG her symptom of angina was a stinging sensation in her left chest. She has had no anginal symptoms. Dizziness/unsteadiness has improved since reducing metoprolol to 50 mg bid and she has overall been feeling well with the exception of the events on 05/13/23.  She denies shortness of breath, orthopnea, PND, edema, presyncope, syncope.   ROS: See HPI       Studies Reviewed: Marland Kitchen   EKG  Interpretation Date/Time:  Thursday May 16 2023 10:42:54 EDT Ventricular Rate:  69 PR Interval:  142 QRS Duration:  82 QT Interval:  384 QTC Calculation: 411 R Axis:   55  Text Interpretation: Normal sinus rhythm Septal infarct , age undetermined When compared with ECG of 15-Jun-2021 07:56, PREVIOUS ECG IS PRESENT No acute changes Confirmed by Eligha Bridegroom [id number]) on 05/16/2023 10:51:25 AM     Risk Assessment/Calculations:    CHA2DS2-VASc Score = 5   This indicates a 7.2% annual risk of stroke. The patient's score is based upon: CHF History: 0 HTN History: 1 Diabetes History: 0 Stroke History: 0 Vascular Disease History: 1 Age Score: 2 Gender Score: 1            Physical Exam:   VS:  BP 132/86   Pulse 69   Ht 5\' 2"  (1.575 m)   Wt 140 lb 3.2 oz (63.6 kg)   SpO2 95%   BMI 25.64 kg/m    Wt Readings from Last 3 Encounters:  05/16/23 140 lb 3.2 oz (63.6 kg)  12/19/22 141 lb (64 kg)  06/13/22 139 lb (63 kg)    GEN: Well nourished, well developed in no acute distress NECK: No JVD; No carotid bruits CARDIAC: RRR, no murmurs, rubs, gallops RESPIRATORY:  Clear to auscultation without rales, wheezing or rhonchi  ABDOMEN: Soft, non-tender, non-distended EXTREMITIES:  No edema; No deformity     ASSESSMENT AND PLAN: .    CAD: S/p CABG 03/2021.  She had chest pressure during an episode of A-fib on 8/26. Previous symptom of angina prior to CABG was a stinging sensation in her left chest.  She has had no episodes of chest stinging or chest pressure aside from this one event. She denies dyspnea, or other symptoms concerning for angina. No indication for further ischemic evaluation at this time. No bleeding concerns. We will continue asa and metoprolol.   PAF on chronic anticoagulation: Episode of what sounds like AF RVR on 8/26. She was dehydrated and had been more active that day with people working in her house. She has felt well since waking up on 05/14/23. EKG today reveals  sinus rhythm at 69 bpm. She has been on Eliquis 2.5 mg BID for stroke prevention, however her weight has been > 60 kg for the past several months. We will increase her to 5 mg BID which is appropriate dose for stroke prevention for CHA2DS2-VASc score of 5. She feels less gait instability since reducing metoprolol dose. We discussed additional beta-blocker or low-dose CCB for episodes of A-fib.  She would like to take additional lopressor if needed. Advised she may take 25 to 50 mg as needed in addition to twice daily dose for episodes of a fib. Advised her to notify us if she has elevated HR that does not improve with additional BB.   Hyperlipidemia LDL goal < 55/Statin myalgia: LDL 175, non HDL 213, TG 204 on lipid panel 12/18/22. Referred  to Lipid clinic due to history of statin myalgia. Seen by Malena Peer, RPH on 01/08/23. She did not wish to start therapy to lower cholesterol at that time. I reiterated the importance of reducing her cholesterol in the setting of CAD. She states she will think about it and let us know if she changes her mind.   Hypertension: BP is well controlled.        Dispo: 6 months with me (she requests to see me since Dr. Eldridge Dace will be leaving)  Signed, Eligha Bridegroom, NP-C

## 2023-05-16 ENCOUNTER — Ambulatory Visit: Payer: Medicare Other | Attending: Nurse Practitioner | Admitting: Nurse Practitioner

## 2023-05-16 ENCOUNTER — Encounter: Payer: Self-pay | Admitting: Nurse Practitioner

## 2023-05-16 VITALS — BP 132/86 | HR 69 | Ht 62.0 in | Wt 140.2 lb

## 2023-05-16 DIAGNOSIS — Z789 Other specified health status: Secondary | ICD-10-CM

## 2023-05-16 DIAGNOSIS — I4891 Unspecified atrial fibrillation: Secondary | ICD-10-CM | POA: Diagnosis not present

## 2023-05-16 DIAGNOSIS — I480 Paroxysmal atrial fibrillation: Secondary | ICD-10-CM

## 2023-05-16 DIAGNOSIS — I25708 Atherosclerosis of coronary artery bypass graft(s), unspecified, with other forms of angina pectoris: Secondary | ICD-10-CM | POA: Diagnosis not present

## 2023-05-16 DIAGNOSIS — E782 Mixed hyperlipidemia: Secondary | ICD-10-CM | POA: Diagnosis not present

## 2023-05-16 DIAGNOSIS — Z951 Presence of aortocoronary bypass graft: Secondary | ICD-10-CM | POA: Diagnosis not present

## 2023-05-16 MED ORDER — APIXABAN 5 MG PO TABS: 5.0000 mg | ORAL_TABLET | Freq: Two times a day (BID) | ORAL | 3 refills | Status: DC

## 2023-05-16 NOTE — Patient Instructions (Signed)
Medication Instructions:  Your physician recommends that you continue on your current medications as directed. Please refer to the Current Medication list given to you today.   *If you need a refill on your cardiac medications before your next appointment, please call your pharmacy*   Lab Work: None ordered  If you have labs (blood work) drawn today and your tests are completely normal, you will receive your results only by: MyChart Message (if you have MyChart) OR A paper copy in the mail If you have any lab test that is abnormal or we need to change your treatment, we will call you to review the results.   Testing/Procedures: None ordered   Follow-Up: At [facility], you and your health needs are our priority.  As part of our continuing mission to provide you with exceptional heart care, we have created designated Provider Care Teams.  These Care Teams include your primary Cardiologist (physician) and Advanced Practice Providers (APPs -  Physician Assistants and Nurse Practitioners) who all work together to provide you with the care you need, when you need it.  We recommend signing up for the patient portal called "MyChart".  Sign up information is provided on this After Visit Summary.  MyChart is used to connect with patients for Virtual Visits (Telemedicine).  Patients are able to view lab/test results, encounter notes, upcoming appointments, etc.  Non-urgent messages can be sent to your provider as well.   To learn more about what you can do with MyChart, go to [email or web address].    Your next appointment:   6 month(s)  Provider:   Eligha Bridegroom, NP         Other Instructions

## 2023-05-21 ENCOUNTER — Encounter: Payer: Self-pay | Admitting: Pulmonary Disease

## 2023-05-21 ENCOUNTER — Other Ambulatory Visit (INDEPENDENT_AMBULATORY_CARE_PROVIDER_SITE_OTHER): Payer: Medicare Other

## 2023-05-21 ENCOUNTER — Ambulatory Visit: Payer: Medicare Other | Admitting: Pulmonary Disease

## 2023-05-21 ENCOUNTER — Other Ambulatory Visit: Payer: Self-pay | Admitting: Interventional Cardiology

## 2023-05-21 ENCOUNTER — Ambulatory Visit (INDEPENDENT_AMBULATORY_CARE_PROVIDER_SITE_OTHER): Payer: Medicare Other

## 2023-05-21 VITALS — BP 112/66 | HR 83 | Temp 97.8°F | Ht 62.0 in | Wt 140.0 lb

## 2023-05-21 DIAGNOSIS — R0602 Shortness of breath: Secondary | ICD-10-CM

## 2023-05-21 LAB — CBC WITH DIFFERENTIAL/PLATELET
Basophils Absolute: 0.1 10*3/uL (ref 0.0–0.1)
Basophils Relative: 0.8 % (ref 0.0–3.0)
Eosinophils Absolute: 0.1 10*3/uL (ref 0.0–0.7)
Eosinophils Relative: 1.8 % (ref 0.0–5.0)
HCT: 39.4 % (ref 36.0–46.0)
Hemoglobin: 12.9 g/dL (ref 12.0–15.0)
Lymphocytes Relative: 21 % (ref 12.0–46.0)
Lymphs Abs: 1.4 10*3/uL (ref 0.7–4.0)
MCHC: 32.6 g/dL (ref 30.0–36.0)
MCV: 89.9 fl (ref 78.0–100.0)
Monocytes Absolute: 0.7 10*3/uL (ref 0.1–1.0)
Monocytes Relative: 10.8 % (ref 3.0–12.0)
Neutro Abs: 4.5 10*3/uL (ref 1.4–7.7)
Neutrophils Relative %: 65.6 % (ref 43.0–77.0)
Platelets: 361 10*3/uL (ref 150.0–400.0)
RBC: 4.39 Mil/uL (ref 3.87–5.11)
RDW: 14.3 % (ref 11.5–15.5)
WBC: 6.9 10*3/uL (ref 4.0–10.5)

## 2023-05-21 MED ORDER — BREZTRI AEROSPHERE 160-9-4.8 MCG/ACT IN AERO: 2.0000 | INHALATION_SPRAY | Freq: Two times a day (BID) | RESPIRATORY_TRACT | Status: DC

## 2023-05-21 NOTE — Progress Notes (Signed)
Kimberly Calhoun    [id number]    1941-02-23  Primary Care Physician:Welborn, Alycia Rossetti, DO  Referring Physician: Jackelyn Poling, DO [address] [place],  [place] [address]  Chief complaint: Acute visit for dyspnea  HPI: 82 y.o. who  has a past medical history of Arthritis, Asthma, CAD (coronary artery disease), Chronic kidney disease, stage 3 (HCC), Dyslipidemia (05/13/2021), Headache, Hypercholesteremia, Hyperlipidemia, Osteopenia, Personal history of colonic polyps, and Pleural effusion.   She is a patient of Dr. Tonia Brooms with history of recurrent left-sided pleural effusion.  Underwent thoracentesis in 2022 with pneumothorax ex vacuo, trapped lung.  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  She returns clinic with mild increase in dyspnea over the past week.  She denies any cough, sputum production.  No fevers or chills.  Pets: Cat Occupation: Retired Exposures: No mold, hot tub, Jacuzzi.  No feather pillows or comforters Smoking history: Never smoker Travel history: Previously lived in [place] Relevant family history: No family history of lung disease.  Outpatient Encounter Medications as of 05/21/2023  Medication Sig   acetaminophen (TYLENOL) 500 MG tablet Take 500-1,000 mg by mouth every 6 (six) hours as needed for moderate pain or headache.   albuterol (VENTOLIN HFA) 108 (90 Base) MCG/ACT inhaler Inhale 1 puff into the lungs every 6 (six) hours as needed for shortness of breath.   apixaban (ELIQUIS) 5 MG TABS tablet Take 1 tablet (5 mg total) by mouth 2 (two) times daily.   aspirin EC 81 MG tablet Take one tablet by mouth on Monday, Wednesday and Friday. Swallow whole.   CALCIUM PO Take 1 tablet by mouth daily.   Cholecalciferol (VITAMIN D PO) Take 1 tablet by mouth every morning.   Cyanocobalamin (VITAMIN B-12) 5000 MCG SUBL Place 1 tablet under the tongue daily.   fluticasone (FLONASE) 50 MCG/ACT nasal spray Place 1 spray into both nostrils daily  as needed for allergies.   levocetirizine (XYZAL) 5 MG tablet Take 5 mg by mouth every evening.   metoprolol tartrate (LOPRESSOR) 50 MG tablet Take 1 tablet (50 mg total) by mouth 2 (two) times daily.   Multiple Vitamin (MULTIVITAMIN WITH MINERALS) TABS tablet Take 1 tablet by mouth at bedtime. Centrum silver.   Propylene Glycol (SYSTANE BALANCE OP) Apply 1 drop to eye every 4 (four) hours as needed (dry eyes.).   vitamin C (ASCORBIC ACID) 500 MG tablet Take 500 mg by mouth every morning.   vitamin E 180 MG (400 UNITS) capsule Take 1 capsule (400 Units total) by mouth every morning.   zinc gluconate 50 MG tablet Take 50 mg by mouth daily.   No facility-administered encounter medications on file as of 05/21/2023.    Physical Exam: Blood pressure 112/66, pulse 83, temperature 97.8 F (36.6 C), temperature source Oral, height 5\' 2"  (1.575 m), weight 140 lb (63.5 kg), SpO2 98%. Gen:      No acute distress HEENT:  EOMI, sclera anicteric Neck:     No masses; no thyromegaly Lungs:    Clear to auscultation bilaterally; normal respiratory effort CV:         Regular rate and rhythm; no murmurs Abd:      + bowel sounds; soft, non-tender; no palpable masses, no distension Ext:    No edema; adequate peripheral perfusion Skin:      Warm and dry; no rash Neuro: alert and oriented x 3 Psych: normal mood and affect  Data Reviewed: Imaging:  CT chest 06/12/2021-postop changes with moderate loculated left-sided hydropneumothorax.  I reviewed the images personally.  PFTs:  Labs:  Assessment:  Dyspnea Her presentation sounds like reactive airway disease given her history of allergies and occasional wheezing Will check CBC differential, IgE Give samples of Breztri inhaler.  If she likes this then we can call in a prescription schedule PFTs  History of pleural effusion Get chest x-ray today.  Plan/Recommendations: CBC, IgE Breztri inhaler Chest x-ray PFTs  Chilton Greathouse MD [facility] 05/21/2023, 9:31 AM  CC: Jackelyn Poling, DO

## 2023-05-21 NOTE — Patient Instructions (Signed)
Will check CBC with differential, IgE Give you a sample of Breztri inhaler.  If he likes this then we can send in a prescription Get a chest x-ray today Schedule PFTs and follow-up in 3 to 4 months

## 2023-05-22 ENCOUNTER — Telehealth: Payer: Self-pay | Admitting: Interventional Cardiology

## 2023-05-22 ENCOUNTER — Encounter: Payer: Self-pay | Admitting: Nurse Practitioner

## 2023-05-22 ENCOUNTER — Ambulatory Visit: Payer: Medicare Other | Attending: Nurse Practitioner

## 2023-05-22 DIAGNOSIS — I480 Paroxysmal atrial fibrillation: Secondary | ICD-10-CM

## 2023-05-22 LAB — IGE: IgE (Immunoglobulin E), Serum: 29 kU/L (ref <=114)

## 2023-05-22 NOTE — Telephone Encounter (Signed)
Called pt in regards to AF.  Reports had a regular OV with PCP c/o SOB with activity.  Was referred to Pulmonology had a CXR yesterday does not know results at this time.   Reports increased episodes of Afib had a sneezing episode X9 around 8:30 pm went into AF 113/97-138 took an extra dose of metoprolol 50 mg.  By 11:30 pm 129/83-76.  BP this am 120/73-68.  Asked if pt has been sick recently.  Reports increased allergies and runny nose.  Had med change to Xyzal does not feel this is helping.  Advised pt to call PCP advise that increased allergies could be causing more episodes of AF.  Pt feels that extra dose of metoprolol 50 mg is helping with Afib.  Pt expresses concern d/t increased AF episodes.  Again advised pt to reach out to PCP to address allergies to see if this helps with Afib episodes.  Pt reports will call.  Advised will send to [facility], NP to review.

## 2023-05-22 NOTE — Telephone Encounter (Signed)
Pt called in to speak about afib. Did not wish to go into detail.

## 2023-05-22 NOTE — Telephone Encounter (Signed)
We can offer her a 14 day cardiac monitor to assess how frequently she is having a fib. If it is occurring frequently, she could consider going on an anti-arrhythmic medication or ablation. We can refer her to EP if/when she is ready.

## 2023-05-22 NOTE — Telephone Encounter (Signed)
I spoke with patient.  She started having fluttering this afternoon with elevated heart rate.  She took one extra 50 mg lopressor.  She would like to wear monitor.  Order placed. Patient aware monitor will be mailed to her

## 2023-05-22 NOTE — Progress Notes (Unsigned)
Enrolled for [employer] to mail a ZIO XT long term holter monitor to the patients address on file.   Dr. Varanasi to read. 

## 2023-05-22 NOTE — Telephone Encounter (Signed)
I spoke with patient and gave her message from Eligha Bridegroom, NP.  Patient reports she has not had any more a fib episodes and would like to hold off on wearing monitor at this time.  She will call us back if she would like to wear the monitor.

## 2023-05-22 NOTE — Telephone Encounter (Signed)
Patient is returning call and is requesting a call back to discuss further wearing a heart monitor. Please advise.

## 2023-05-22 NOTE — Addendum Note (Signed)
Addended by: Dossie Arbour on: 05/22/2023 04:14 PM   Modules accepted: Orders

## 2023-05-22 NOTE — Telephone Encounter (Signed)
Error

## 2023-05-29 ENCOUNTER — Telehealth: Payer: Self-pay | Admitting: Pulmonary Disease

## 2023-05-29 NOTE — Telephone Encounter (Signed)
Please call PT w/Xray results. Thanks

## 2023-05-31 NOTE — Telephone Encounter (Signed)
I called and reviewed chest x-ray results with patient.  Nothing further needed.

## 2023-05-31 NOTE — Telephone Encounter (Signed)
Pt would like cxr results from 9/3. Dr. Isaiah Serge please advise

## 2023-06-07 DIAGNOSIS — I480 Paroxysmal atrial fibrillation: Secondary | ICD-10-CM

## 2023-06-18 DIAGNOSIS — H353221 Exudative age-related macular degeneration, left eye, with active choroidal neovascularization: Secondary | ICD-10-CM | POA: Diagnosis not present

## 2023-06-24 ENCOUNTER — Telehealth: Payer: Self-pay | Admitting: Nurse Practitioner

## 2023-06-24 NOTE — Telephone Encounter (Signed)
Pt c/o BP issue: STAT if pt c/o blurred vision, one-sided weakness or slurred speech  1. What are your last 5 BP readings?   2. Are you having any other symptoms (ex. Dizziness, headache, blurred vision, passed out)?  Mild headache occasionally   3. What is your BP issue?   Patient states her BP has been elevated. She states she has readings, but would prefer to speak with Eligha Bridegroom, NP/RN to further discuss.

## 2023-06-25 NOTE — Telephone Encounter (Signed)
Spoke with patient amd she states since last week her BP has been elevated and she has been having heart flutters. She states she was on 2.5 mg of Eliquis and  was told she needs to be on 5 mg of Eliquis. She states she started taking  5 mg of Eliguis. Once starting  the Eliquis 5 mg she started to feel the flutters. She stopped taking the 5 mg and went back 2.5 mg. The flutters stopped when she went back to the 2.5 mg. When she ran out of the 2.5 mg she went back to the 5 mg and the flutters started again. She states the flutters wakes her up in the early morning. So its hard to sleep. Below is her BP readings that she has wrote down.  BP today at 445 am was 149/84 hr 60. The flutters woke her up. She took a metoprolol and is not feeling the flutters currently.   Did advise to continue to monitor. Stay hydrated. Will forward to provider.   10/1 113/66 hr 57 10/2 137/79 hr 67 10/6 150/79 hr 57 10/7 132/82 hr 69

## 2023-06-25 NOTE — Telephone Encounter (Signed)
Pt following up wanting to speak with a nurse about her BP she states it does not matter who she speaks. Please advise

## 2023-06-26 ENCOUNTER — Encounter (HOSPITAL_BASED_OUTPATIENT_CLINIC_OR_DEPARTMENT_OTHER): Payer: Self-pay

## 2023-06-26 MED ORDER — METOPROLOL TARTRATE 50 MG PO TABS: 50.0000 mg | ORAL_TABLET | Freq: Two times a day (BID) | ORAL | 11 refills | Status: DC

## 2023-06-26 NOTE — Telephone Encounter (Signed)
Please let Kimberly Calhoun know that the higher dose of Eliquis is to protect her against stroke based on her weight and her kidney function. She is not fully protected on the 2.5 mg twice daily dose. It should not cause flutters, so could those correlate with times of fast heart rate or high blood pressure? We could try a higher dose of metoprolol to see if she feels better and has blood pressure readings that are consistently < 130/80 which is her goal.

## 2023-06-26 NOTE — Telephone Encounter (Signed)
S/w pt at this time does not want to increase Eliquis ( 5 mg) bid. Went over stroke precautions. Sent pharmacist message about splitting ( 5 mg) of Eliquis since pt has a 3 month supply at home.not ideal, but I can be done in the short term, like if we were reducing the dose and they had some 5mg  at home. Pt states will take extra ASA (81 mg ) if needed and pt will take extra lopressor if need for flutter. Will send pt mychart message with the update from the pharmacist on the splitting Eliquis. Will send in refill per pt # 30 to requested pharmacy.

## 2023-07-01 ENCOUNTER — Encounter: Payer: Self-pay | Admitting: Allergy & Immunology

## 2023-07-01 ENCOUNTER — Ambulatory Visit: Payer: Medicare Other | Admitting: Allergy & Immunology

## 2023-07-01 ENCOUNTER — Other Ambulatory Visit: Payer: Self-pay

## 2023-07-01 VITALS — BP 120/72 | HR 65 | Temp 98.0°F | Resp 20 | Ht 61.02 in | Wt 138.5 lb

## 2023-07-01 DIAGNOSIS — J4530 Mild persistent asthma, uncomplicated: Secondary | ICD-10-CM

## 2023-07-01 DIAGNOSIS — R0602 Shortness of breath: Secondary | ICD-10-CM

## 2023-07-01 DIAGNOSIS — J310 Chronic rhinitis: Secondary | ICD-10-CM | POA: Diagnosis not present

## 2023-07-01 MED ORDER — IPRATROPIUM BROMIDE 0.03 % NA SOLN: 1.0000 | Freq: Three times a day (TID) | NASAL | 5 refills | Status: DC | PRN

## 2023-07-01 NOTE — Progress Notes (Signed)
FOLLOW UP  Date of Service/Encounter:  07/01/23   Assessment:   Chronic non-allergic rhinitis - testing done in September 2022  Shortness of breath - likely persistent asthma given the history and the mild reversibility   Plan/Recommendations:   1. Non-allergic rhinitis - We will plan to do more sensitive intradermal testing in your arm at the next visit. - You need to be off of your antihistamine (Claritin, Zyrtec, Benadryl, Allegra, etc) so we can do the testing. - We will know more after this is done. - We will add on Atrovent (ipratropium) one spray per nostril up 3 times daily as needed. - START once daily and go up from there.   2. SOB (shortness of breath) - Lung testing was in the mid 50% range and it DID improve with the Xopenex treatment. - This points towards asthma and means that your Markus Daft might help your symptoms in the long run. - I am going to let Dr. Isaiah Serge and Dr. Tonia Brooms know that we saw you and did this testing. - I would recommend that you do Breztri two puffs twice daily to try to get on top of this.  - Spacer use reviewed. - Daily controller medication(s): Breztri two puffs twice daily with spacer - Prior to physical activity: albuterol 2 puffs 10-15 minutes before physical activity. - Rescue medications: albuterol 4 puffs every 4-6 hours as needed - Asthma control goals:  * Full participation in all desired activities (may need albuterol before activity) * Albuterol use two time or less a week on average (not counting use with activity) * Cough interfering with sleep two time or less a month * Oral steroids no more than once a year * No hospitalizations  3. Return in about 1 week (around 07/08/2023) for SKIN TESTING. You can have the follow up appointment with Dr. Dellis Anes or a Nurse Practicioner (our Nurse Practitioners are excellent and always have Physician oversight!).    Subjective:   Kimberly Calhoun is a 82 y.o. 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  Discussed the use of AI scribe software for clinical note transcription with the patient and/or guardian, who gave verbal consent to proceed.  Kimberly Calhoun is a 82 y.o. female presenting for a follow up visit.  She was last seen in September 2022 by Dr. Maurine Minister.  At that time, she underwent allergy testing which was completely negative.  She did not have intradermal testing done.  She was started on Flonase 2 sprays per nostril daily.  For her shortness of breath, It was recommended that she get a full pulmonary function test to evaluate her lung function.  We did not order this because she had a pulmonologist.  She was told to follow-up as needed.  Since the last visit, she has continued to have problems with rhinitis symptoms shortness of breath.  Asthma/Respiratory Symptom History: The patient presents with worsening sinus congestion and breathlessness. She reports a sensation of congestion primarily in the area behind her eyes, which has been gradually worsening. The patient denies any lung involvement, and recent visits to pulmonology have confirmed that her lungs are functioning as expected. She does see Dr. Isaiah Serge.  Her last appointment was in September 2024.  At that  time, it was felt that her's presentation sounded like reactive airway disease.  She had some lab work done including a CBC with differential and an IgE.  She obtained samples of Breztri to use 2 puffs twice daily.  She did get a chest x-ray.  She is previously seeing Dr. Tonia Brooms with the same practice.  Chest x-ray was normal.  Absolute eosinophil count was 100. She does have a PFT ordered, but it is not clear that this has been done. Review of the chart shows that this is scheduled for early December 2024.   Allergic Rhinitis Symptom History: The patient has tried multiple nasal sprays, including Flonase, with limited relief. She also uses saline sprays and performs regular nasal irrigation. Despite these interventions, the patient reports persistent discomfort, particularly in the area behind her eyes. The patient has a history of allergies, which were previously managed with a two-year course of allergy shots in her thirties. She has been allergy-free until recently. The patient has a seven-year-old cat, but she does not believe the cat is contributing to her symptoms as the symptoms did not start when she got the cat. The patient has tried various antihistamines, including Claritin and Zyrtec, with no significant improvement in symptoms. She is currently taking Zyrtec daily. She takes this in conjunction with her  Flonase every day. She cannot name other nasal sprays that she has tried.   The patient also reports a history of heart surgery, after which she experienced fluid collection in her left lung. This fluid was drained two or three times, and the lung is now in its normal position. The patient denies any pain associated with this issue. She reports being sensitive to many medications and has experienced nosebleeds while on Eliquis. Her aspirin dosage has been reduced to every other day to manage this issue.   The patient is active in her retirement, with hobbies including gardening.   Otherwise,  there have been no changes to her past medical history, surgical history, family history, or social history.    Review of systems otherwise negative other than that mentioned in the HPI.    Objective:   Blood pressure 120/72, pulse 65, temperature 98 F (36.7 C), temperature source Temporal, resp. rate 20, height 5' 1.02" (1.55 m), weight 138 lb 8 oz (62.8 kg), SpO2 96%. Body mass index is 26.15 kg/m.    Physical Exam Constitutional:      Appearance: She is well-developed.  HENT:     Head: Normocephalic and atraumatic.     Right Ear: Tympanic membrane, ear canal and external ear normal. No drainage, swelling or tenderness. Tympanic membrane is not injected, scarred, erythematous, retracted or bulging.     Left Ear: Tympanic membrane, ear canal and external ear normal. No drainage, swelling or tenderness. Tympanic membrane is not injected, scarred, erythematous, retracted or bulging.     Nose: Mucosal edema and rhinorrhea present. No nasal deformity or septal deviation.     Right Turbinates: Not enlarged.  Left Turbinates: Enlarged.     Right Sinus: No maxillary sinus tenderness or frontal sinus tenderness.     Left Sinus: No maxillary sinus tenderness or frontal sinus tenderness.     Mouth/Throat:     Mouth: Mucous membranes are not pale and not dry.     Pharynx: Uvula midline.  Eyes:     General:        Right eye: No discharge.        Left eye: No discharge.     Conjunctiva/sclera: Conjunctivae normal.     Right eye: Right conjunctiva is not injected. No chemosis.    Left eye: Left conjunctiva is not injected. No chemosis.    Pupils: Pupils are equal, round, and reactive to light.  Cardiovascular:     Rate and Rhythm: Normal rate and regular rhythm.     Heart sounds: Normal heart sounds.  Pulmonary:     Effort: Pulmonary effort is normal. No tachypnea, accessory muscle usage or respiratory distress.     Breath sounds: Normal breath sounds. No wheezing, rhonchi or  rales.  Chest:     Chest wall: No tenderness.  Lymphadenopathy:     Head:     Right side of head: No submandibular, tonsillar or occipital adenopathy.     Left side of head: No submandibular, tonsillar or occipital adenopathy.     Cervical: No cervical adenopathy.  Skin:    Coloration: Skin is not pale.     Findings: No abrasion, erythema, petechiae or rash. Rash is not papular, urticarial or vesicular.  Neurological:     Mental Status: She is alert.      Diagnostic studies:    Spirometry: results abnormal (FEV1: 0.95/57%, FVC: 1.24/57%, FEV1/FVC: 77%).    Spirometry consistent with possible restrictive disease. Xopenex four puffs via MDI treatment given in clinic with improvement in FEV1 and FVC, but not significant per ATS criteria.  Her FEV1 increased 60 mL and her FVC increased 130 mL.  Allergy Studies: none        Malachi Bonds, MD  [facility]

## 2023-07-01 NOTE — Patient Instructions (Addendum)
1. Non-allergic rhinitis - We will plan to do more sensitive intradermal testing in your arm at the next visit. - You need to be off of your antihistamine (Claritin, Zyrtec, Benadryl, Allegra, etc) so we can do the testing. - We will know more after this is done. - We will add on Atrovent (ipratropium) one spray per nostril up 3 times daily as needed. - START once daily and go up from there.   2. SOB (shortness of breath) - Lung testing was in the mid 50% range and it DID improve with the Xopenex treatment. - This points towards asthma and means that your Kimberly Calhoun might help your symptoms in the long run. - I am going to let Dr. Isaiah Serge and Dr. Tonia Brooms know that we saw you and did this testing. - I would recommend that you do Breztri two puffs twice daily to try to get on top of this.  - Spacer use reviewed. - Daily controller medication(s): Breztri two puffs twice daily with spacer - Prior to physical activity: albuterol 2 puffs 10-15 minutes before physical activity. - Rescue medications: albuterol 4 puffs every 4-6 hours as needed - Asthma control goals:  * Full participation in all desired activities (may need albuterol before activity) * Albuterol use two time or less a week on average (not counting use with activity) * Cough interfering with sleep two time or less a month * Oral steroids no more than once a year * No hospitalizations  3. Return in about 1 week (around 07/08/2023) for SKIN TESTING. You can have the follow up appointment with Dr. Dellis Anes or a Nurse Practicioner (our Nurse Practitioners are excellent and always have Physician oversight!).    Please inform us of any Emergency Department visits, hospitalizations, or changes in symptoms. Call us before going to the ED for breathing or allergy symptoms since we might be able to fit you in for a sick visit. Feel free to contact us anytime with any questions, problems, or concerns.  It was a pleasure to meet you  today!  Websites that have reliable patient information: 1. American Academy of Asthma, Allergy, and Immunology: www.aaaai.org 2. Food Allergy Research and Education (FARE): foodallergy.org 3. Mothers of Asthmatics: http://www.asthmacommunitynetwork.org 4. American College of Allergy, Asthma, and Immunology: www.acaai.org   COVID-19 Vaccine Information can be found at: PodExchange.nl For questions related to vaccine distribution or appointments, please email [email or web address] or call [phone].     "Like" [place] on Facebook and Instagram for our latest updates!      A healthy democracy works best when [employer] participate! Make sure you are registered to vote! If you have moved or changed any of your contact information, you will need to get this updated before voting! Scan the QR codes below to learn more!

## 2023-07-02 DIAGNOSIS — I480 Paroxysmal atrial fibrillation: Secondary | ICD-10-CM | POA: Diagnosis not present

## 2023-07-02 NOTE — Telephone Encounter (Signed)
S/w pt and explained again that 2.5 mg of Eliquis bid is not going protect as well as 5 mg bid against a stroke. Pt is aware and will cut 5 mg tablets in half till finished will all bottles. S/w Melissa, Pharm D yesterday and explained the reason the split is advised against due to accuracy. Pt is aware.

## 2023-07-05 ENCOUNTER — Telehealth: Payer: Self-pay | Admitting: Nurse Practitioner

## 2023-07-05 NOTE — Telephone Encounter (Signed)
Patient is returning call to discuss monitor results.

## 2023-07-05 NOTE — Telephone Encounter (Signed)
Discussed result finding/recommendation. Pt is no longer feeling the flutters, but will let us know if need to readdress.  States her sister sees Dr. Jacinto Halim and she would like to establish with him. 25mo recall changed to [facility]  Forwarding to NP for her FYI

## 2023-07-08 ENCOUNTER — Encounter: Payer: Self-pay | Admitting: Family Medicine

## 2023-07-08 ENCOUNTER — Ambulatory Visit: Payer: Medicare Other | Admitting: Family Medicine

## 2023-07-08 DIAGNOSIS — J310 Chronic rhinitis: Secondary | ICD-10-CM | POA: Insufficient documentation

## 2023-07-08 NOTE — Patient Instructions (Addendum)
1. Non-allergic rhinitis Your intradermal skin testing was negative at today's visit Continue Atrovent (ipratropium) one spray per nostril up 3 times daily as needed. Consider saline nasal rinses as needed for nasal symptoms. Use this before any medicated nasal sprays for best result For a stuffy nose, consider Flonase 2 sprays in each nostril once a day.  In the right nostril, point the applicator out toward the right ear. In the left nostril, point the applicator out toward the left ear  2. SOB (shortness of breath) - Daily controller medication(s): Breztri two puffs twice daily with spacer - Prior to physical activity: albuterol 2 puffs 10-15 minutes before physical activity. - Rescue medications: albuterol 4 puffs every 4-6 hours as needed - Asthma control goals:  * Full participation in all desired activities (may need albuterol before activity) * Albuterol use two time or less a week on average (not counting use with activity) * Cough interfering with sleep two time or less a month * Oral steroids no more than once a year * No hospitalizations  3. Follow up in 3 months or sooner if needed

## 2023-07-08 NOTE — Progress Notes (Signed)
[address] [place] [address] Dept: [phone]  FOLLOW UP NOTE  Patient ID: Kimberly Calhoun, female    DOB: [birth year]  Age: 82 y.o. MRN: [id number] Date of Office Visit: 07/08/2023  Assessment  Chief Complaint: Allergy Testing (Intradermal testing)  HPI Kimberly Calhoun is an 82 year old female who presents to the clinic for follow-up visit with intradermal environmental allergy skin testing.  She was last seen in this clinic on 07/01/2023 by Dr. Dellis Anes for evaluation of nonallergic rhinitis and asthma.  At that time her percutaneous environmental allergy testing was negative.  At today's visit, she reports that she is feeling well overall with no cardiopulmonary, gastrointestinal, or integumentary symptoms. She is reporting some postnasal drainage and clear rhinorrhea at this time. She has not had any antihistamines over the last 3 days.   Her current medications are listed in the chart.  Drug Allergies:  Allergies  Allergen Reactions   Amiodarone Nausea And Vomiting   Macrobid [Nitrofurantoin] Other (See Comments)    Cold chills, muscle weakness.   Mold Extract [Trichophyton] Other (See Comments)    Allergies.    Other Other (See Comments)    Allergies.   Mold in Cheese - stuffy nose and headache  Chocolate- stuffy nose and headache  Mildew - stuffy nose and headache   Statins Other (See Comments)    Muscle pain   Zyrtec [Cetirizine Hcl] Other (See Comments)    PACs    Physical Exam: There were no vitals taken for this visit.   Physical Exam Constitutional:      Appearance: Normal appearance.  HENT:     Head: Normocephalic and atraumatic.  Eyes:     Conjunctiva/sclera: Conjunctivae normal.  Cardiovascular:     Rate and Rhythm: Normal rate and regular rhythm.     Heart sounds: Normal heart sounds. No murmur heard. Pulmonary:     Effort: Pulmonary effort is normal.     Breath sounds: Normal breath sounds.     Comments: Lungs clear to  auscultation Musculoskeletal:        General: Normal range of motion.     Cervical back: Normal range of motion and neck supple.  Skin:    General: Skin is warm and dry.  Neurological:     Mental Status: She is alert and oriented to person, place, and time.  Psychiatric:        Mood and Affect: Mood normal.        Behavior: Behavior normal.        Thought Content: Thought content normal.        Judgment: Judgment normal.     Diagnostics: Percutaneous environmental skin testing at last visit was negative to the environmental panel.  Intradermal environmental allergy skin testing at today's visit  Assessment and Plan: 1. Non-allergic rhinitis     Return in about 3 months (around 10/08/2023), or if symptoms worsen or fail to improve.    Thank you for the opportunity to care for this patient.  Please do not hesitate to contact me with questions.  Thermon Leyland, FNP [facility]

## 2023-08-23 ENCOUNTER — Telehealth: Payer: Self-pay | Admitting: Nurse Practitioner

## 2023-08-23 NOTE — Telephone Encounter (Signed)
*  STAT* If patient is at the pharmacy, call can be transferred to refill team.   1. Which medications need to be refilled? (please list name of each medication and dose if known) Dob? Issues with 5 mg eliq, switched to 2.5 MG   2. Which pharmacy/location (including street and city if local pharmacy) is medication to be sent to? metoprolol tartrate (LOPRESSOR) 50 MG tablet [id number] - [place], [place] - [address]  3. Do they need a 30 day or 90 day supply?  90 day supply

## 2023-08-23 NOTE — Telephone Encounter (Signed)
Pt c/o medication issue:  1. Name of Medication:  Eliquis   2. How are you currently taking this medication (dosage and times per day)?   3. Are you having a reaction (difficulty breathing--STAT)?   4. What is your medication issue?   Paitent would like to know if she can start taking a 2.5 MG tablet instead of 1/2 of a 5 MG tablet. Please advise.

## 2023-08-26 ENCOUNTER — Ambulatory Visit: Payer: Medicare Other | Admitting: Pulmonary Disease

## 2023-08-26 DIAGNOSIS — R0602 Shortness of breath: Secondary | ICD-10-CM

## 2023-08-26 LAB — PULMONARY FUNCTION TEST
DL/VA % pred: 97 %
DL/VA: 4.04 ml/min/mmHg/L
DLCO cor % pred: 75 %
DLCO cor: 12.59 ml/min/mmHg
DLCO unc % pred: 75 %
DLCO unc: 12.59 ml/min/mmHg
FEF 25-75 Post: 0.9 L/s
FEF 25-75 Pre: 0.59 L/s
FEF2575-%Change-Post: 52 %
FEF2575-%Pred-Post: 87 %
FEF2575-%Pred-Pre: 57 %
FEV1-%Change-Post: 13 %
FEV1-%Pred-Post: 80 %
FEV1-%Pred-Pre: 71 %
FEV1-Post: 1.22 L
FEV1-Pre: 1.08 L
FEV1FVC-%Change-Post: 1 %
FEV1FVC-%Pred-Pre: 90 %
FEV6-%Change-Post: 10 %
FEV6-%Pred-Post: 93 %
FEV6-%Pred-Pre: 84 %
FEV6-Post: 1.81 L
FEV6-Pre: 1.64 L
FEV6FVC-%Change-Post: 0 %
FEV6FVC-%Pred-Post: 106 %
FEV6FVC-%Pred-Pre: 106 %
FVC-%Change-Post: 11 %
FVC-%Pred-Post: 87 %
FVC-%Pred-Pre: 78 %
FVC-Post: 1.82 L
FVC-Pre: 1.64 L
Post FEV1/FVC ratio: 67 %
Post FEV6/FVC ratio: 99 %
Pre FEV1/FVC ratio: 66 %
Pre FEV6/FVC Ratio: 100 %
RV % pred: 104 %
RV: 2.42 L
TLC % pred: 87 %
TLC: 4.04 L

## 2023-08-26 NOTE — Progress Notes (Signed)
Full PFT performed today. °

## 2023-08-26 NOTE — Patient Instructions (Signed)
Full PFT performed today. °

## 2023-08-27 NOTE — Telephone Encounter (Signed)
Please call patient and advise, as we have recently done on multiple occsions, that her prescribed dose is Eliquis 5 mg twice daily based on her age, weight, and normal kidney function. This is to protect her against risk of stroke due to a fib and CHA2DS2-VASc score of 5. If she takes 2.5 mg twice daily, we cannot ensure that she has adequate stroke prevention. I would prefer that we continue to prescribe the 5 mg tablets.

## 2023-08-28 MED ORDER — METOPROLOL TARTRATE 50 MG PO TABS: 50.0000 mg | ORAL_TABLET | Freq: Two times a day (BID) | ORAL | 2 refills | Status: DC

## 2023-08-28 MED ORDER — APIXABAN 5 MG PO TABS: 5.0000 mg | ORAL_TABLET | Freq: Two times a day (BID) | ORAL | 5 refills | Status: DC

## 2023-08-28 NOTE — Addendum Note (Signed)
Addended by: Adriana Simas, Demarion Pondexter L on: 08/28/2023 09:46 AM   Modules accepted: Orders

## 2023-08-28 NOTE — Addendum Note (Signed)
Addended by: Louanna Raw on: 08/28/2023 10:00 AM   Modules accepted: Orders

## 2023-08-28 NOTE — Telephone Encounter (Signed)
S/w pt is aware is at a higher stroke risk with lower dose of Eliquis. This has been discussed several times in the past. Pt stated does not hold office accountable. Pt stated has a # 90 day supply of Eliquis of ( 5 mg ) tablets. This has also been discussed in the past with pt.  Advised with Pharmacy that cutting tablets in half is a short term solution. Pt is aware.

## 2023-08-28 NOTE — Telephone Encounter (Signed)
Prescription refill request for Eliquis received. Indication: PAF Last office visit: 05/16/23  Benjamine Sprague NP Scr: 1.06 on 12/18/22  Epic Age: 82 Weight: 63.6kg  Based on above findings Eliquis 5mg  twice daily is the appropriate dose.  Refill approved. Pt is suppose to be taking 5mg  twice daily per Eligha Bridegroom NP

## 2023-08-28 NOTE — Telephone Encounter (Signed)
Pt is requesting Eliquis

## 2023-09-03 DIAGNOSIS — H353221 Exudative age-related macular degeneration, left eye, with active choroidal neovascularization: Secondary | ICD-10-CM | POA: Diagnosis not present

## 2023-09-03 DIAGNOSIS — H3581 Retinal edema: Secondary | ICD-10-CM | POA: Diagnosis not present

## 2023-09-16 ENCOUNTER — Other Ambulatory Visit: Payer: Self-pay | Admitting: Family Medicine

## 2023-09-16 ENCOUNTER — Ambulatory Visit: Payer: Medicare Other

## 2023-09-16 ENCOUNTER — Ambulatory Visit
Admission: RE | Admit: 2023-09-16 | Discharge: 2023-09-16 | Disposition: A | Discharge status: Home / Self Care | Payer: Medicare Other | Source: Ambulatory Visit | Attending: Family Medicine

## 2023-09-16 ENCOUNTER — Ambulatory Visit: Admission: RE | Admit: 2023-09-16 | Payer: Medicare Other | Source: Ambulatory Visit

## 2023-09-16 DIAGNOSIS — R921 Mammographic calcification found on diagnostic imaging of breast: Secondary | ICD-10-CM

## 2023-09-16 DIAGNOSIS — N6489 Other specified disorders of breast: Secondary | ICD-10-CM | POA: Diagnosis not present

## 2023-09-19 ENCOUNTER — Ambulatory Visit
Admission: RE | Admit: 2023-09-19 | Discharge: 2023-09-19 | Disposition: A | Discharge status: Home / Self Care | Payer: Medicare Other | Source: Ambulatory Visit | Attending: Family Medicine | Admitting: Family Medicine

## 2023-09-19 DIAGNOSIS — R921 Mammographic calcification found on diagnostic imaging of breast: Secondary | ICD-10-CM | POA: Diagnosis not present

## 2023-09-19 DIAGNOSIS — N6011 Diffuse cystic mastopathy of right breast: Secondary | ICD-10-CM | POA: Diagnosis not present

## 2023-09-19 DIAGNOSIS — N6313 Unspecified lump in the right breast, lower outer quadrant: Secondary | ICD-10-CM | POA: Diagnosis not present

## 2023-09-19 HISTORY — PX: BREAST BIOPSY: SHX20

## 2023-09-20 LAB — SURGICAL PATHOLOGY

## 2023-09-23 ENCOUNTER — Ambulatory Visit: Payer: Medicare Other | Admitting: Pulmonary Disease

## 2023-09-25 ENCOUNTER — Ambulatory Visit: Payer: Medicare Other | Admitting: Pulmonary Disease

## 2023-09-25 ENCOUNTER — Encounter: Payer: Self-pay | Admitting: Pulmonary Disease

## 2023-09-25 VITALS — BP 158/82 | HR 69 | Ht 63.0 in | Wt 137.0 lb

## 2023-09-25 DIAGNOSIS — J310 Chronic rhinitis: Secondary | ICD-10-CM | POA: Diagnosis not present

## 2023-09-25 DIAGNOSIS — R0602 Shortness of breath: Secondary | ICD-10-CM | POA: Diagnosis not present

## 2023-09-25 NOTE — Progress Notes (Signed)
 Kimberly Calhoun    [id number]    03-03-1941  Primary Care Physician:Welborn, Bernardino, DO  Referring Physician: Dayna Bernardino, DO [address] [place],  [place] [address]  Chief complaint: Acute visit for dyspnea  HPI: 83 y.o. who  has a past medical history of Arthritis, Asthma, CAD (coronary artery disease), Chronic kidney disease, stage 3 (HCC), Dyslipidemia (05/13/2021), Headache, Hypercholesteremia, Hyperlipidemia, Osteopenia, Personal history of colonic polyps, and Pleural effusion.   She is a patient of Dr. Brenna with history of recurrent left-sided pleural effusion.  Underwent thoracentesis in 2022 with pneumothorax ex vacuo, trapped lung.  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  She returns clinic with mild increase in dyspnea over the past week.  She denies any cough, sputum production.  No fevers or chills.  Pets: Cat Occupation: Retired Exposures: No mold, hot tub, Jacuzzi.  No feather pillows or comforters Smoking history: Never smoker Travel history: Previously lived in [place]  Relevant family history: No family history of lung disease.  Interim History: Discussed the use of AI scribe software for clinical note transcription with the patient, who gave verbal consent to proceed.  The patient, with a history of pleural effusions and open heart surgery, presents for follow-up. She reports intermittent shortness of breath, which she attributes to a tight bra and a wire from previous open heart surgery. She denies severe breathlessness or any other respiratory symptoms. She also reports a persistent fungal infection in the groin area, which has been particularly bothersome this year. She has not tried the Breztri  inhaler provided due to concerns about potential side effects with her fungal infection. She also reports nonallergic rhinitis, presenting as a runny nose, but denies any severe symptoms related to this condition.   Outpatient  Encounter Medications as of 09/25/2023  Medication Sig   acetaminophen  (TYLENOL ) 500 MG tablet Take 500-1,000 mg by mouth every 6 (six) hours as needed for moderate pain or headache.   albuterol  (VENTOLIN  HFA) 108 (90 Base) MCG/ACT inhaler Inhale 1 puff into the lungs every 6 (six) hours as needed for shortness of breath.   apixaban  (ELIQUIS ) 5 MG TABS tablet Take 1 tablet (5 mg total) by mouth 2 (two) times daily.   aspirin  EC 81 MG tablet Take one tablet by mouth on Monday, Wednesday and Friday. Swallow whole.   CALCIUM  PO Take 1 tablet by mouth daily.   Cholecalciferol (VITAMIN D PO) Take 1 tablet by mouth every morning.   Cyanocobalamin (VITAMIN B-12) 5000 MCG SUBL Place 1 tablet under the tongue daily.   fluticasone  (FLONASE ) 50 MCG/ACT nasal spray Place 1 spray into both nostrils daily as needed for allergies.   ipratropium (ATROVENT ) 0.03 % nasal spray Place 1 spray into both nostrils 3 (three) times daily as needed for rhinitis.   levocetirizine (XYZAL) 5 MG tablet Take 5 mg by mouth every evening.   metoprolol  tartrate (LOPRESSOR ) 50 MG tablet Take 1 tablet (50 mg total) by mouth 2 (two) times daily.   Multiple Vitamin (MULTIVITAMIN WITH MINERALS) TABS tablet Take 1 tablet by mouth at bedtime. Centrum silver.   Propylene Glycol (SYSTANE BALANCE OP) Apply 1 drop to eye every 4 (four) hours as needed (dry eyes.).   vitamin C (ASCORBIC ACID) 500 MG tablet Take 500 mg by mouth every morning.   vitamin E  180 MG (400 UNITS) capsule Take 1 capsule (400 Units total) by mouth every morning.   zinc gluconate 50 MG  tablet Take 50 mg by mouth daily.   Budeson-Glycopyrrol-Formoterol (BREZTRI  AEROSPHERE) 160-9-4.8 MCG/ACT AERO Inhale 2 puffs into the lungs in the morning and at bedtime. (Patient not taking: Reported on 09/25/2023)   No facility-administered encounter medications on file as of 09/25/2023.    Physical Exam: Blood pressure 112/66, pulse 83, temperature 97.8 F (36.6 C), temperature source  Oral, height 5' 2 (1.575 m), weight 140 lb (63.5 kg), SpO2 98%. Gen:      No acute distress HEENT:  EOMI, sclera anicteric Neck:     No masses; no thyromegaly Lungs:    Clear to auscultation bilaterally; normal respiratory effort CV:         Regular rate and rhythm; no murmurs Abd:      + bowel sounds; soft, non-tender; no palpable masses, no distension Ext:    No edema; adequate peripheral perfusion Skin:      Warm and dry; no rash Neuro: alert and oriented x 3 Psych: normal mood and affect  Data Reviewed: Imaging: CT chest 06/12/2021-postop changes with moderate loculated left-sided hydropneumothorax.   Chest x-ray 05/21/2023-hyperexpanded lungs.  No pleural effusion or pneumothorax I reviewed the images personally  PFTs: 08/26/2023 FVC 1.82 [87%], FEV1 1.22 [80%], F/F67, TLC 4.04 [87%], DLCO 12.59 [75%] Normal test   Labs: CBC 05/21/2023- WBC 6.9, eos 1.8% IgE 05/21/2023- 29  Assessment:  Pleural Effusion Resolved. No recurrence on recent chest x-ray. -No further intervention required at this time.  Shortness of Breath Mild and intermittent. Patient declined to use Breztri  inhaler due to concerns about potential fungal infection. Lung function tests and chest x-ray are normal. -No change in management. Continue to monitor symptoms.  Nonallergic Rhinitis Mild symptoms. No evidence of allergies or inflammation on recent labs. -No change in management. Continue to monitor symptoms.  Follow-up As needed, given the stable and mild nature of symptoms.   Kimberly Luellen MD [facility] 09/25/2023, 10:43 AM  CC: Kimberly Motto, DO

## 2023-09-25 NOTE — Patient Instructions (Signed)
 VISIT SUMMARY:  You came in today for a follow-up visit. We discussed your history of pleural effusions and open heart surgery, as well as your current symptoms, including intermittent shortness of breath, a persistent fungal infection in the groin area, and nonallergic rhinitis. We reviewed your recent chest x-ray and lung function tests, which were normal.  YOUR PLAN:  -PLEURAL EFFUSION: A pleural effusion is a buildup of fluid between the tissues that line the lungs and the chest. Your recent chest x-ray shows no recurrence, so no further intervention is needed at this time.  -SHORTNESS OF BREATH: Your mild and intermittent shortness of breath does not appear to be related to any serious lung issues, as your lung function tests and chest x-ray are normal. We will continue to monitor your symptoms, and you can decide whether to use the Breztri  inhaler based on your comfort level.  -NONALLERGIC RHINITIS: Nonallergic rhinitis is a condition where the inside of your nose becomes inflamed, causing a runny nose. Your symptoms are mild, and there is no evidence of allergies or inflammation in your recent labs. We will continue to monitor your symptoms.  INSTRUCTIONS:  Please follow up as needed, given the stable and mild nature of your symptoms.

## 2023-10-07 ENCOUNTER — Ambulatory Visit: Payer: Medicare Other | Admitting: Family Medicine

## 2023-10-14 ENCOUNTER — Encounter: Payer: Self-pay | Admitting: Family Medicine

## 2023-10-14 ENCOUNTER — Ambulatory Visit: Payer: Medicare Other | Admitting: Family Medicine

## 2023-10-14 VITALS — BP 130/76 | HR 71 | Temp 98.0°F | Resp 16

## 2023-10-14 DIAGNOSIS — M26621 Arthralgia of right temporomandibular joint: Secondary | ICD-10-CM

## 2023-10-14 DIAGNOSIS — H5201 Hypermetropia, right eye: Secondary | ICD-10-CM | POA: Diagnosis not present

## 2023-10-14 DIAGNOSIS — J310 Chronic rhinitis: Secondary | ICD-10-CM | POA: Diagnosis not present

## 2023-10-14 DIAGNOSIS — R0602 Shortness of breath: Secondary | ICD-10-CM | POA: Diagnosis not present

## 2023-10-14 DIAGNOSIS — S0300XA Dislocation of jaw, unspecified side, initial encounter: Secondary | ICD-10-CM | POA: Insufficient documentation

## 2023-10-14 NOTE — Patient Instructions (Addendum)
Non-allergic rhinitis Continue Atrovent (ipratropium) one spray per nostril up 3 times daily as needed. Consider saline nasal rinses as needed for nasal symptoms. Use this before any medicated nasal sprays for best result For a stuffy nose, consider Flonase 2 sprays in each nostril once a day.  In the right nostril, point the applicator out toward the right ear. In the left nostril, point the applicator out toward the left ear  SOB (shortness of breath) Continue albuterol 2 puffs once every 4 hours if needed for cough or wheeze You may use albuterol 2 puffs 5 to 15 minutes before activity to decrease cough or wheeze If needed, for asthma flare, begin Breztri 2 puffs twice a day with a spacer for 2 weeks or until cough and wheeze free  TMJ Continue to follow-up with your primary care provider. You may find some benefit to the exercises printed out from up-to-date   Call the clinic if this treatment plan is not working well for you.  Follow up in 1 year or sooner if needed

## 2023-10-14 NOTE — Progress Notes (Signed)
[address] [place] [address] Dept: [phone]  FOLLOW UP NOTE  Patient ID: Kimberly Calhoun, female    DOB: 11-Nov-1940  Age: 83 y.o. MRN: [id number] Date of Office Visit: 10/14/2023  Assessment  Chief Complaint: Allergies (Still taking all meds.)  HPI Kimberly Calhoun is AN 83 year old female who presents to the clinic for a well follow-up visit.  She was last seen in this clinic on 07/08/2023 by Thermon Leyland, FNP, for evaluation of environmental allergy skin testing which was negative to the adult environmental allergy panel.  She continues to follow-up with Dr. Isaiah Serge at [facility] pulmonology, for management of her breathing.  Her current problem list is significant for coronary artery disease, chronic kidney disease stage III, and recurrent left-sided pleural effusion.  At today's visit, she reports her breathing has been moderately well-controlled with occasional shortness of breath and infrequent dry cough.  She is not currently using albuterol or Breztri.  She continues to follow-up with her pulmonology specialist, Dr. Isaiah Serge who she last saw on 04/17/2024.  Her last imaging was a two-view chest x-ray on 05/21/2023 indicating hyperexpanded and clear lungs with no pleural effusion or pneumothorax.  She continues to see Dr. Tonia Brooms for recurrent left-sided pleural effusion.  Nonallergic rhinitis is reported as moderately well-controlled with clear rhinorrhea and nasal congestion mainly occurring in the morning and clearing as she gets up and moves around.  She continues nasal saline rinses as needed and is not currently using a nasal steroid spray or saline nasal rinse.  Her last environmental allergy skin testing was on 07/09/2023 and was negative to intradermal allergy testing.  She reports tightness and pain in her jaw on the right side.  She has discussed the possibility of TMJ with her dentist and primary care provider.  She is not currently doing any exercises to relieve TMJ.  Her current  medications are listed in the chart.  Testing Results Reviewed: CLINICAL DATA:  Short of breath. EXAM: CHEST - 2 VIEW COMPARISON:  09/21/2021.  Chest CT, 06/12/2021.  FINDINGS: Previous median sternotomy. Cardiac silhouette is top-normal in size. No mediastinal or hilar masses. No evidence of adenopathy.  Lungs are hyperexpanded, but clear. No pleural effusion or pneumothorax.  Skeletal structures are intact.  IMPRESSION: No active cardiopulmonary disease. Electronically Signed   By: Amie Portland M.D.   On: 05/28/2023 13:52   Drug Allergies:  Allergies  Allergen Reactions   Amiodarone Nausea And Vomiting   Macrobid [Nitrofurantoin] Other (See Comments)    Cold chills, muscle weakness.   Mold Extract [Trichophyton] Other (See Comments)    Allergies.    Other Other (See Comments)    Allergies.   Mold in Cheese - stuffy nose and headache  Chocolate- stuffy nose and headache  Mildew - stuffy nose and headache   Statins Other (See Comments)    Muscle pain   Zyrtec [Cetirizine Hcl] Other (See Comments)    PACs    Physical Exam: BP 130/76 (BP Location: Right Arm, Patient Position: Sitting, Cuff Size: Normal)   Pulse 71   Temp 98 F (36.7 C) (Temporal)   Resp 16   SpO2 97%    Physical Exam Vitals reviewed.  Constitutional:      Appearance: Normal appearance.  HENT:     Head: Normocephalic and atraumatic.     Right Ear: Tympanic membrane normal.     Left Ear: Tympanic membrane normal.     Nose:     Comments: Bilateral nares normal.  Pharynx normal.  Ears normal.  Eyes normal.    Mouth/Throat:     Pharynx: Oropharynx is clear.  Eyes:     Conjunctiva/sclera: Conjunctivae normal.  Cardiovascular:     Rate and Rhythm: Normal rate and regular rhythm.     Heart sounds: Normal heart sounds. No murmur heard. Pulmonary:     Effort: Pulmonary effort is normal.     Breath sounds: Normal breath sounds.     Comments: Lungs clear to auscultation Musculoskeletal:         General: Normal range of motion.     Cervical back: Normal range of motion and neck supple.  Skin:    General: Skin is warm and dry.  Neurological:     Mental Status: She is alert and oriented to person, place, and time.  Psychiatric:        Mood and Affect: Mood normal.        Behavior: Behavior normal.        Thought Content: Thought content normal.        Judgment: Judgment normal.     Assessment and Plan: 1. SOB (shortness of breath)   2. Non-allergic rhinitis   3. Arthralgia of right temporomandibular joint     Patient Instructions  Non-allergic rhinitis Continue Atrovent (ipratropium) one spray per nostril up 3 times daily as needed. Consider saline nasal rinses as needed for nasal symptoms. Use this before any medicated nasal sprays for best result For a stuffy nose, consider Flonase 2 sprays in each nostril once a day.  In the right nostril, point the applicator out toward the right ear. In the left nostril, point the applicator out toward the left ear  SOB (shortness of breath) Continue albuterol 2 puffs once every 4 hours if needed for cough or wheeze You may use albuterol 2 puffs 5 to 15 minutes before activity to decrease cough or wheeze If needed, for asthma flare, begin Breztri 2 puffs twice a day with a spacer for 2 weeks or until cough and wheeze free  TMJ Continue to follow-up with your primary care provider. You may find some benefit to the exercises printed out from up-to-date   Call the clinic if this treatment plan is not working well for you.  Follow up in 1 year or sooner if needed   Return in about 1 year (around 10/13/2024), or if symptoms worsen or fail to improve.    Thank you for the opportunity to care for this patient.  Please do not hesitate to contact me with questions.  Thermon Leyland, FNP [facility]

## 2023-11-05 DIAGNOSIS — B356 Tinea cruris: Secondary | ICD-10-CM | POA: Diagnosis not present

## 2023-11-21 DIAGNOSIS — Z6824 Body mass index (BMI) 24.0-24.9, adult: Secondary | ICD-10-CM | POA: Diagnosis not present

## 2023-11-21 DIAGNOSIS — N1831 Chronic kidney disease, stage 3a: Secondary | ICD-10-CM | POA: Diagnosis not present

## 2023-11-21 DIAGNOSIS — I252 Old myocardial infarction: Secondary | ICD-10-CM | POA: Diagnosis not present

## 2023-11-21 DIAGNOSIS — I480 Paroxysmal atrial fibrillation: Secondary | ICD-10-CM | POA: Diagnosis not present

## 2023-11-21 DIAGNOSIS — M810 Age-related osteoporosis without current pathological fracture: Secondary | ICD-10-CM | POA: Diagnosis not present

## 2023-12-02 ENCOUNTER — Telehealth: Payer: Self-pay | Admitting: Nurse Practitioner

## 2023-12-02 ENCOUNTER — Other Ambulatory Visit: Payer: Self-pay

## 2023-12-02 MED ORDER — APIXABAN 5 MG PO TABS: 5.0000 mg | ORAL_TABLET | Freq: Two times a day (BID) | ORAL | 5 refills | Status: DC

## 2023-12-02 NOTE — Telephone Encounter (Signed)
 Prescription refill request for Eliquis received. Indication:afib Last office visit:8/24 Scr:1.06  4/24 Age: 83 Weight:62.1  kg  Prescription refilled

## 2023-12-02 NOTE — Telephone Encounter (Signed)
*  STAT* If patient is at the pharmacy, call can be transferred to refill team.   1. Which medications need to be refilled? (please list name of each medication and dose if known) Eliquis   2. Would you like to learn more about the convenience, safety, & potential cost savings by using the [facility]?     3. Are you open to using the [facility] (Type [facility].   4. Which pharmacy/location (including street and city if local pharmacy) is medication to be sent to?[facility]  [address] [place]   5. Do they need a 30 day or 90 day supply? 60 days and refills

## 2024-01-07 DIAGNOSIS — H353221 Exudative age-related macular degeneration, left eye, with active choroidal neovascularization: Secondary | ICD-10-CM | POA: Diagnosis not present

## 2024-01-07 DIAGNOSIS — H3581 Retinal edema: Secondary | ICD-10-CM | POA: Diagnosis not present

## 2024-02-04 DIAGNOSIS — B029 Zoster without complications: Secondary | ICD-10-CM | POA: Diagnosis not present

## 2024-02-14 ENCOUNTER — Other Ambulatory Visit: Payer: Self-pay | Admitting: Interventional Cardiology

## 2024-02-17 ENCOUNTER — Telehealth: Payer: Self-pay | Admitting: Nurse Practitioner

## 2024-02-17 ENCOUNTER — Other Ambulatory Visit: Payer: Self-pay | Admitting: Interventional Cardiology

## 2024-02-17 DIAGNOSIS — I4891 Unspecified atrial fibrillation: Secondary | ICD-10-CM

## 2024-02-17 MED ORDER — APIXABAN 2.5 MG PO TABS
2.5000 mg | ORAL_TABLET | Freq: Two times a day (BID) | ORAL | 1 refills | Status: DC
Start: 2024-02-17 — End: 2024-03-26

## 2024-02-17 NOTE — Telephone Encounter (Signed)
*  STAT* If patient is at the pharmacy, call can be transferred to refill team.   1. Which medications need to be refilled? (please list name of each medication and dose if known) Eliquis  2.5 MG   2. Would you like to learn more about the convenience, safety, & potential cost savings by using the [facility]? No   3. Are you open to using the [facility] ([facility]. ) No   4. Which pharmacy/location (including street and city if local pharmacy) is medication to be sent to? [id number] - [place], [place] - [address]    5. Do they need a 30 day or 90 day supply? 90 day  T leaving to go out of town tomorrow and needs medication

## 2024-02-17 NOTE — Telephone Encounter (Addendum)
 Prescription refill request for Eliquis  received. Indication: Afib  Last office visit: 05/16/23 ([facility])  Scr: 0.94 (11/21/23 via PCP)  Age: 83 Weight: 62.1kg  Per dosing criteria, pt should be taking 5mg  BID.  I called pt and confirmed she is currently taking 2.5mg  BID.   Refill for Eliquis  2.5mg  BID was sent earlier today by Zulema Hitchcock, CMA. Since this is not an appropriate dose I called [facility] pharmacy and spoke with Yasmin. This prescription was canceled.    Per Donivan Furry, PharmD, pt should be taking 5mg  BID.  I called pt and made her aware Eliquis  5 mg twice daily is the correct dose based on her age, weight, and normal kidney function. This is to protect her against risk of stroke due to a fib. If she takes 2.5 mg twice daily, we cannot ensure that she has adequate stroke prevention. We would prefer that we continue to prescribe the 5 mg tablets.   Pt verbalized understanding and is very clear on the risk of not taking the correct dosage; however, she would like to continue 2.5mg  BID at this time. I contimed with Kristin Alvstad, PharmD that Eliquis  2.5mg  BID can be sent to requested pharmacy. Refill sent.

## 2024-02-17 NOTE — Telephone Encounter (Signed)
 Prescription refill request for Eliquis  received. Indication: Afib  Last office visit: 05/16/23 ([facility])  Scr:   Age: 83 Weight: 62.1kg  Labs overdue. Requested labs from PCP office.

## 2024-03-17 DIAGNOSIS — L900 Lichen sclerosus et atrophicus: Secondary | ICD-10-CM | POA: Diagnosis not present

## 2024-03-25 NOTE — Progress Notes (Unsigned)
 Cardiology Office Note:  .   Date:  03/25/2024  ID:  Kimberly Calhoun, DOB 01-10-1941, MRN [id number] PCP: Dayna Motto, DO  [facility] Providers Cardiologist:  Candyce Reek, MD { Click to update primary MD,subspecialty MD or APP then REFRESH:1}  History of Present Illness: .   Kimberly Calhoun is a 83 y.o. 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  She has hypertension, hypercholesterolemia with statin myopathy and intolerance to all statins and ezetimibe  and did not want to be on PCSK9 inhibitors.  She was being followed by Dr. Reek and now presents to establish care with me  Discussed the use of AI scribe software for clinical note transcription with the patient, who gave verbal consent to proceed.  History of Present Illness    Labs   No results found for: LIPOA  Lab Results  Component Value Date   CHOL 209 (H) 08/14/2021   HDL 51 08/14/2021   LDLCALC 135 (H) 08/14/2021   TRIG 128 08/14/2021   CHOLHDL 4.1 08/14/2021   No results found for: LIPOA  Lab Results  Component Value Date   NA 138 07/05/2021   K 4.4 07/05/2021   CO2 24 07/05/2021   GLUCOSE 94 07/05/2021   BUN 20 07/05/2021   CREATININE 1.14 (H) 07/05/2021   CALCIUM  10.0 07/05/2021   GFRNONAA 48 (L) 07/05/2021      Latest Ref Rng & Units 07/05/2021   11:59 AM 06/15/2021    7:39 AM 05/14/2021    5:30 AM  BMP  Glucose 70 - 99 mg/dL 94  98  897   BUN 8 - 23 mg/dL 20  24  11    Creatinine 0.44 - 1.00 mg/dL 8.85  9.11  9.15   Sodium 135 - 145 mmol/L 138  134  134   Potassium 3.5 - 5.1 mmol/L 4.4  3.9  3.8   Chloride 98 - 111 mmol/L 104  101  102   CO2 22 - 32 mmol/L 24  24  26    Calcium  8.9 - 10.3 mg/dL 89.9  9.2  8.8       Latest Ref Rng & Units 05/21/2023   10:32 AM 05/14/2022   12:56  PM 10/04/2021    3:07 PM  CBC  WBC 4.0 - 10.5 K/uL 6.9  8.7  8.6   Hemoglobin 12.0 - 15.0 g/dL 87.0  87.6  88.4   Hematocrit 36.0 - 46.0 % 39.4  36.4  35.4   Platelets 150.0 - 400.0 K/uL 361.0  330  416    Lab Results  Component Value Date   HGBA1C 5.8 (H) 03/22/2021    Lab Results  Component Value Date   TSH 4.202 05/13/2021     ROS  ***ROS  Physical Exam:   VS:  There were no vitals taken for this visit.   Wt Readings from Last 3 Encounters:  09/25/23 137 lb (62.1 kg)  07/01/23 138 lb 8 oz (62.8 kg)  05/21/23 140 lb (63.5 kg)    ***Physical Exam Studies Reviewed: SABRA    Coronary angiogram 03/22/2021  CABG x 3 on 03/24/2021 x 3 with LIMA to LAD, SVG to PDA and SVG to DX  ECHOCARDIOGRAM COMPLETE 05/14/2021  1. Left ventricular ejection fraction, by estimation, is 65 to 70%.  The left ventricle has normal function. The left ventricle has no regional wall motion abnormalities. Left ventricular diastolic parameters are consistent with Grade I diastolic dysfunction (impaired relaxation). Elevated left ventricular end-diastolic pressure. 2. Right ventricular systolic function is mildly reduced. The right ventricular size is normal. There is normal pulmonary artery systolic pressure. The estimated right ventricular systolic pressure is 27.4 mmHg. 3. Large pleural effusion in the left lateral region.  EKG:         Medications ordered    No orders of the defined types were placed in this encounter.    ASSESSMENT AND PLAN: .      ICD-10-CM   1. Paroxysmal atrial fibrillation (HCC)  I48.0     2. Coronary artery disease of native artery of native heart with stable angina pectoris (HCC)  I25.118     3. Essential hypertension  I10     4. Mixed hyperlipidemia  E78.2     5. Statin intolerance  Z78.9       Assessment and Plan Assessment & Plan      Signed,  Gordy Bergamo, MD, [facility] 03/25/2024, 10:26 PM [facility] [address] [place], [place] [address] Phone:  [phone]. Fax:  [phone]

## 2024-03-26 ENCOUNTER — Ambulatory Visit: Attending: Cardiology | Admitting: Cardiology

## 2024-03-26 ENCOUNTER — Encounter: Payer: Self-pay | Admitting: Cardiology

## 2024-03-26 VITALS — BP 145/72 | HR 63 | Resp 16 | Ht 63.0 in | Wt 136.8 lb

## 2024-03-26 DIAGNOSIS — E782 Mixed hyperlipidemia: Secondary | ICD-10-CM | POA: Diagnosis not present

## 2024-03-26 DIAGNOSIS — I480 Paroxysmal atrial fibrillation: Secondary | ICD-10-CM

## 2024-03-26 DIAGNOSIS — I25118 Atherosclerotic heart disease of native coronary artery with other forms of angina pectoris: Secondary | ICD-10-CM

## 2024-03-26 DIAGNOSIS — I2510 Atherosclerotic heart disease of native coronary artery without angina pectoris: Secondary | ICD-10-CM | POA: Diagnosis not present

## 2024-03-26 DIAGNOSIS — I10 Essential (primary) hypertension: Secondary | ICD-10-CM

## 2024-03-26 DIAGNOSIS — Z789 Other specified health status: Secondary | ICD-10-CM

## 2024-03-26 MED ORDER — APIXABAN 5 MG PO TABS: 5.0000 mg | ORAL_TABLET | Freq: Two times a day (BID) | ORAL | 3 refills | Status: AC

## 2024-03-26 MED ORDER — RED YEAST RICE 600 MG PO CAPS: 1200.0000 mg | ORAL_CAPSULE | Freq: Every evening | ORAL | 3 refills | Status: AC

## 2024-03-26 NOTE — Patient Instructions (Signed)
 Medication Instructions:  Increase Eliquis  to 5 mg by mouth twice daily  Start red yeast rice  600 mg- 2 capsules once daily  *If you need a refill on your cardiac medications before your next appointment, please call your pharmacy*  Lab Work: none If you have labs (blood work) drawn today and your tests are completely normal, you will receive your results only by: MyChart Message (if you have MyChart) OR A paper copy in the mail If you have any lab test that is abnormal or we need to change your treatment, we will call you to review the results.  Testing/Procedures: none  Follow-Up: At [facility], you and your health needs are our priority.  As part of our continuing mission to provide you with exceptional heart care, our providers are all part of one team.  This team includes your primary Cardiologist (physician) and Advanced Practice Providers or APPs (Physician Assistants and Nurse Practitioners) who all work together to provide you with the care you need, when you need it.  Your next appointment:   12 month(s)  Provider:   Gordy Bergamo, MD    We recommend signing up for the patient portal called MyChart.  Sign up information is provided on this After Visit Summary.  MyChart is used to connect with patients for Virtual Visits (Telemedicine).  Patients are able to view lab/test results, encounter notes, upcoming appointments, etc.  Non-urgent messages can be sent to your provider as well.   To learn more about what you can do with MyChart, go to [email or web address].   Other Instructions

## 2024-04-14 DIAGNOSIS — H353221 Exudative age-related macular degeneration, left eye, with active choroidal neovascularization: Secondary | ICD-10-CM | POA: Diagnosis not present

## 2024-04-14 DIAGNOSIS — H3581 Retinal edema: Secondary | ICD-10-CM | POA: Diagnosis not present

## 2024-05-15 ENCOUNTER — Other Ambulatory Visit: Payer: Self-pay | Admitting: Nurse Practitioner

## 2024-05-20 DIAGNOSIS — I25810 Atherosclerosis of coronary artery bypass graft(s) without angina pectoris: Secondary | ICD-10-CM | POA: Diagnosis not present

## 2024-05-20 DIAGNOSIS — L900 Lichen sclerosus et atrophicus: Secondary | ICD-10-CM | POA: Diagnosis not present

## 2024-05-20 DIAGNOSIS — Z951 Presence of aortocoronary bypass graft: Secondary | ICD-10-CM | POA: Diagnosis not present

## 2024-05-20 DIAGNOSIS — I480 Paroxysmal atrial fibrillation: Secondary | ICD-10-CM | POA: Diagnosis not present

## 2024-05-20 DIAGNOSIS — Z0000 Encounter for general adult medical examination without abnormal findings: Secondary | ICD-10-CM | POA: Diagnosis not present

## 2024-06-18 DIAGNOSIS — L900 Lichen sclerosus et atrophicus: Secondary | ICD-10-CM | POA: Diagnosis not present

## 2024-06-18 DIAGNOSIS — L304 Erythema intertrigo: Secondary | ICD-10-CM | POA: Diagnosis not present

## 2024-07-07 DIAGNOSIS — H353221 Exudative age-related macular degeneration, left eye, with active choroidal neovascularization: Secondary | ICD-10-CM | POA: Diagnosis not present

## 2024-07-07 DIAGNOSIS — H3581 Retinal edema: Secondary | ICD-10-CM | POA: Diagnosis not present
